# Patient Record
Sex: Female | Born: 1970
Health system: Southern US, Community
[De-identification: ages and names within clinical notes are randomized; demographics above are authoritative.]

## PROBLEM LIST (undated history)

## (undated) DIAGNOSIS — D696 Thrombocytopenia, unspecified: Secondary | ICD-10-CM

## (undated) DIAGNOSIS — K219 Gastro-esophageal reflux disease without esophagitis: Secondary | ICD-10-CM

## (undated) DIAGNOSIS — E559 Vitamin D deficiency, unspecified: Secondary | ICD-10-CM

## (undated) DIAGNOSIS — D0502 Lobular carcinoma in situ of left breast: Secondary | ICD-10-CM

## (undated) DIAGNOSIS — R634 Abnormal weight loss: Secondary | ICD-10-CM

## (undated) DIAGNOSIS — L509 Urticaria, unspecified: Secondary | ICD-10-CM

## (undated) DIAGNOSIS — D509 Iron deficiency anemia, unspecified: Secondary | ICD-10-CM

## (undated) DIAGNOSIS — M3500 Sicca syndrome, unspecified: Secondary | ICD-10-CM

## (undated) DIAGNOSIS — E7800 Pure hypercholesterolemia, unspecified: Secondary | ICD-10-CM

## (undated) DIAGNOSIS — D219 Benign neoplasm of connective and other soft tissue, unspecified: Secondary | ICD-10-CM

## (undated) DIAGNOSIS — E039 Hypothyroidism, unspecified: Secondary | ICD-10-CM

## (undated) DIAGNOSIS — H04129 Dry eye syndrome of unspecified lacrimal gland: Secondary | ICD-10-CM

## (undated) DIAGNOSIS — N979 Female infertility, unspecified: Secondary | ICD-10-CM

## (undated) DIAGNOSIS — E739 Lactose intolerance, unspecified: Secondary | ICD-10-CM

## (undated) DIAGNOSIS — D72819 Decreased white blood cell count, unspecified: Secondary | ICD-10-CM

## (undated) DIAGNOSIS — K819 Cholecystitis, unspecified: Secondary | ICD-10-CM

## (undated) DIAGNOSIS — I499 Cardiac arrhythmia, unspecified: Secondary | ICD-10-CM

## (undated) DIAGNOSIS — K8020 Calculus of gallbladder without cholecystitis without obstruction: Secondary | ICD-10-CM

## (undated) DIAGNOSIS — R768 Other specified abnormal immunological findings in serum: Secondary | ICD-10-CM

## (undated) HISTORY — DX: Thrombocytopenia, unspecified: D69.6

## (undated) HISTORY — DX: Benign neoplasm of connective and other soft tissue, unspecified: D21.9

## (undated) HISTORY — DX: Sjogren syndrome, unspecified: M35.00

## (undated) HISTORY — DX: Dry eye syndrome of unspecified lacrimal gland: H04.129

## (undated) HISTORY — PX: ANTERIOR CRUCIATE LIGAMENT REPAIR: SHX115

## (undated) HISTORY — DX: Lactose intolerance, unspecified: E73.9

## (undated) HISTORY — DX: Urticaria, unspecified: L50.9

## (undated) HISTORY — DX: Decreased white blood cell count, unspecified: D72.819

## (undated) HISTORY — PX: ENDOMETRIAL BIOPSY: SHX622

## (undated) HISTORY — PX: WISDOM TOOTH EXTRACTION: SHX21

## (undated) HISTORY — DX: Cholecystitis, unspecified: K81.9

## (undated) HISTORY — DX: Lobular carcinoma in situ of left breast: D05.02

## (undated) HISTORY — PX: FERTILITY SURGERY: SHX945

## (undated) HISTORY — PX: OTHER SURGICAL HISTORY: SHX169

## (undated) HISTORY — DX: Abnormal weight loss: R63.4

## (undated) HISTORY — DX: Calculus of gallbladder without cholecystitis without obstruction: K80.20

## (undated) HISTORY — DX: Gastro-esophageal reflux disease without esophagitis: K21.9

---

## 2010-11-13 DIAGNOSIS — D696 Thrombocytopenia, unspecified: Secondary | ICD-10-CM | POA: Insufficient documentation

## 2010-11-13 DIAGNOSIS — N979 Female infertility, unspecified: Secondary | ICD-10-CM | POA: Insufficient documentation

## 2012-04-08 DIAGNOSIS — J310 Chronic rhinitis: Secondary | ICD-10-CM | POA: Insufficient documentation

## 2012-04-08 DIAGNOSIS — D8989 Other specified disorders involving the immune mechanism, not elsewhere classified: Secondary | ICD-10-CM | POA: Insufficient documentation

## 2012-04-08 DIAGNOSIS — E039 Hypothyroidism, unspecified: Secondary | ICD-10-CM | POA: Insufficient documentation

## 2012-04-08 DIAGNOSIS — R768 Other specified abnormal immunological findings in serum: Secondary | ICD-10-CM | POA: Insufficient documentation

## 2014-05-06 DIAGNOSIS — L508 Other urticaria: Secondary | ICD-10-CM | POA: Insufficient documentation

## 2014-07-12 ENCOUNTER — Other Ambulatory Visit: Payer: Self-pay

## 2014-07-12 ENCOUNTER — Emergency Department (HOSPITAL_COMMUNITY): Payer: 59

## 2014-07-12 ENCOUNTER — Observation Stay (HOSPITAL_COMMUNITY)
Admission: EM | Admit: 2014-07-12 | Discharge: 2014-07-13 | Disposition: A | Discharge status: Home / Self Care | Payer: 59 | Attending: General Surgery | Admitting: General Surgery

## 2014-07-12 ENCOUNTER — Encounter (HOSPITAL_COMMUNITY): Payer: Self-pay | Admitting: *Deleted

## 2014-07-12 DIAGNOSIS — R1011 Right upper quadrant pain: Secondary | ICD-10-CM | POA: Diagnosis present

## 2014-07-12 DIAGNOSIS — E739 Lactose intolerance, unspecified: Secondary | ICD-10-CM | POA: Insufficient documentation

## 2014-07-12 DIAGNOSIS — E559 Vitamin D deficiency, unspecified: Secondary | ICD-10-CM | POA: Diagnosis not present

## 2014-07-12 DIAGNOSIS — N979 Female infertility, unspecified: Secondary | ICD-10-CM | POA: Insufficient documentation

## 2014-07-12 DIAGNOSIS — E039 Hypothyroidism, unspecified: Secondary | ICD-10-CM | POA: Diagnosis not present

## 2014-07-12 DIAGNOSIS — K8050 Calculus of bile duct without cholangitis or cholecystitis without obstruction: Secondary | ICD-10-CM | POA: Diagnosis present

## 2014-07-12 DIAGNOSIS — K8010 Calculus of gallbladder with chronic cholecystitis without obstruction: Principal | ICD-10-CM | POA: Insufficient documentation

## 2014-07-12 DIAGNOSIS — R52 Pain, unspecified: Secondary | ICD-10-CM

## 2014-07-12 HISTORY — DX: Calculus of gallbladder without cholecystitis without obstruction: K80.20

## 2014-07-12 HISTORY — DX: Vitamin D deficiency, unspecified: E55.9

## 2014-07-12 HISTORY — DX: Female infertility, unspecified: N97.9

## 2014-07-12 HISTORY — DX: Other specified abnormal immunological findings in serum: R76.8

## 2014-07-12 HISTORY — DX: Hypothyroidism, unspecified: E03.9

## 2014-07-12 LAB — URINALYSIS, ROUTINE W REFLEX MICROSCOPIC
Bilirubin Urine: NEGATIVE
Glucose, UA: NEGATIVE mg/dL
Hgb urine dipstick: NEGATIVE
KETONES UR: NEGATIVE mg/dL
Leukocytes, UA: NEGATIVE
Nitrite: NEGATIVE
PH: 8 (ref 5.0–8.0)
Protein, ur: NEGATIVE mg/dL
SPECIFIC GRAVITY, URINE: 1.009 (ref 1.005–1.030)
Urobilinogen, UA: 0.2 mg/dL (ref 0.0–1.0)

## 2014-07-12 LAB — COMPREHENSIVE METABOLIC PANEL
ALT: 22 U/L (ref 14–54)
ANION GAP: 9 (ref 5–15)
AST: 25 U/L (ref 15–41)
Albumin: 4.3 g/dL (ref 3.5–5.0)
Alkaline Phosphatase: 69 U/L (ref 38–126)
BILIRUBIN TOTAL: 0.6 mg/dL (ref 0.3–1.2)
BUN: 10 mg/dL (ref 6–20)
CHLORIDE: 101 mmol/L (ref 101–111)
CO2: 26 mmol/L (ref 22–32)
CREATININE: 0.56 mg/dL (ref 0.44–1.00)
Calcium: 9.6 mg/dL (ref 8.9–10.3)
Glucose, Bld: 124 mg/dL — ABNORMAL HIGH (ref 65–99)
Potassium: 3 mmol/L — ABNORMAL LOW (ref 3.5–5.1)
Sodium: 136 mmol/L (ref 135–145)
Total Protein: 7.8 g/dL (ref 6.5–8.1)

## 2014-07-12 LAB — CBC WITH DIFFERENTIAL/PLATELET
Basophils Absolute: 0.1 10*3/uL (ref 0.0–0.1)
Basophils Relative: 2 % — ABNORMAL HIGH (ref 0–1)
Eosinophils Absolute: 0.1 10*3/uL (ref 0.0–0.7)
Eosinophils Relative: 1 % (ref 0–5)
HEMATOCRIT: 37.9 % (ref 36.0–46.0)
HEMOGLOBIN: 12.8 g/dL (ref 12.0–15.0)
LYMPHS PCT: 29 % (ref 12–46)
Lymphs Abs: 1.8 10*3/uL (ref 0.7–4.0)
MCH: 30.8 pg (ref 26.0–34.0)
MCHC: 33.8 g/dL (ref 30.0–36.0)
MCV: 91.1 fL (ref 78.0–100.0)
MONO ABS: 0.5 10*3/uL (ref 0.1–1.0)
MONOS PCT: 8 % (ref 3–12)
NEUTROS PCT: 60 % (ref 43–77)
Neutro Abs: 3.8 10*3/uL (ref 1.7–7.7)
Platelets: 151 10*3/uL (ref 150–400)
RBC: 4.16 MIL/uL (ref 3.87–5.11)
RDW: 12.7 % (ref 11.5–15.5)
WBC: 6.3 10*3/uL (ref 4.0–10.5)

## 2014-07-12 LAB — LIPASE, BLOOD: LIPASE: 26 U/L (ref 22–51)

## 2014-07-12 LAB — POC URINE PREG, ED: Preg Test, Ur: NEGATIVE

## 2014-07-12 MED ORDER — ACETAMINOPHEN 650 MG RE SUPP: 650.0000 mg | Freq: Four times a day (QID) | RECTAL | Status: DC | PRN

## 2014-07-12 MED ORDER — HYDROCODONE-ACETAMINOPHEN 5-325 MG PO TABS: 1.0000 | ORAL_TABLET | ORAL | Status: DC | PRN

## 2014-07-12 MED ORDER — ONDANSETRON HCL 4 MG/2ML IJ SOLN
4.0000 mg | Freq: Once | INTRAMUSCULAR | Status: AC
  Administered 2014-07-12: 4 mg via INTRAVENOUS
  Filled 2014-07-12: qty 2

## 2014-07-12 MED ORDER — ONDANSETRON HCL 4 MG/2ML IJ SOLN
4.0000 mg | Freq: Four times a day (QID) | INTRAMUSCULAR | Status: DC | PRN
Start: 2014-07-12 — End: 2014-07-13

## 2014-07-12 MED ORDER — HYDROMORPHONE HCL 1 MG/ML IJ SOLN: 0.5000 mg | INTRAMUSCULAR | Status: DC | PRN

## 2014-07-12 MED ORDER — POTASSIUM CHLORIDE IN NACL 20-0.9 MEQ/L-% IV SOLN
INTRAVENOUS | Status: DC
  Administered 2014-07-12 – 2014-07-13 (×2): via INTRAVENOUS
  Filled 2014-07-12 (×3): qty 1000

## 2014-07-12 MED ORDER — SODIUM CHLORIDE 0.9 % IV BOLUS (SEPSIS)
1000.0000 mL | Freq: Once | INTRAVENOUS | Status: AC
  Administered 2014-07-12: 1000 mL via INTRAVENOUS

## 2014-07-12 MED ORDER — DOCUSATE SODIUM 100 MG PO CAPS: 100.0000 mg | ORAL_CAPSULE | Freq: Two times a day (BID) | ORAL | Status: DC | PRN

## 2014-07-12 MED ORDER — DIPHENHYDRAMINE HCL 12.5 MG/5ML PO ELIX: 12.5000 mg | ORAL_SOLUTION | Freq: Four times a day (QID) | ORAL | Status: DC | PRN

## 2014-07-12 MED ORDER — ACETAMINOPHEN 325 MG PO TABS: 650.0000 mg | ORAL_TABLET | Freq: Four times a day (QID) | ORAL | Status: DC | PRN

## 2014-07-12 MED ORDER — POTASSIUM CHLORIDE CRYS ER 20 MEQ PO TBCR
40.0000 meq | EXTENDED_RELEASE_TABLET | Freq: Once | ORAL | Status: AC
  Administered 2014-07-12: 40 meq via ORAL
  Filled 2014-07-12: qty 2

## 2014-07-12 MED ORDER — DIPHENHYDRAMINE HCL 50 MG/ML IJ SOLN: 12.5000 mg | Freq: Four times a day (QID) | INTRAMUSCULAR | Status: DC | PRN

## 2014-07-12 MED ORDER — HYDROMORPHONE HCL 1 MG/ML IJ SOLN
0.5000 mg | Freq: Once | INTRAMUSCULAR | Status: AC
  Administered 2014-07-12: 0.5 mg via INTRAVENOUS
  Filled 2014-07-12: qty 1

## 2014-07-12 MED ORDER — DEXTROSE 5 % IV SOLN
2.0000 g | INTRAVENOUS | Status: AC
  Administered 2014-07-13: 2 g via INTRAVENOUS
  Filled 2014-07-12: qty 2

## 2014-07-12 NOTE — ED Notes (Signed)
"  Pt was here at work and had a gall stone attack."

## 2014-07-12 NOTE — H&P (Signed)
[facility] Admission Note  Sarah [name] 02/04/1970  [id number].    Requesting MD: Dr. 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  The pain is sharp and stabbing abdominal pain which comes and goes.  The pain is worst in the epigastrium, but is mildly diffuse as well.  Associated symptoms include nausea and diaphoresis.  Pain radiates to the mid-back/flanks.  She has previous episode of this type of pain in 2012 where she was worked up for gallstones.  She reportedly had a stone at the ampulla, but her pain resolved and she never sought follow-up.  She has had no subsequent episodes until now.   She is unsure if food was a precipitating factor, but ate eggs this am.  Alleviating factor is usually time.  No CP/SOB, fever/chills, diarrhea/constipation, vomiting, dysuria.  H/o digestion issues as a child (never diagnosed), lactose intolerance and "slow digestion" (post-prandial bloating/fullness).  No h/o abdominal surgeries or hernias.     ROS: All systems reviewed and otherwise negative except for as above  No family history on file.  Past Medical History  Diagnosis Date  . Gall stones   . Infertility, female   . Hypothyroidism     Takes synthroid  . Vitamin D deficiency disease     Takes Vit D  . ANA positive     ?Sjogrens    Past Surgical History  Procedure Laterality Date  . Wisdom tooth extraction    . Endometrial biopsy    . Ivf      Social History:  reports that she has never smoked. She does not have any smokeless tobacco history on file. She reports that she does not drink alcohol or use illicit drugs.  Allergies:  Allergies  Allergen Reactions  . Lactose Intolerance (Gi)      (Not in a hospital admission)  Blood pressure 168/98, pulse 76,  temperature 98 F (36.7 C), temperature source Oral, resp. rate 16, height 5' 3" (1.6 m), weight 54.432 kg (120 lb), SpO2 100 %. Physical Exam: General: pleasant, WD/WN Asian female who is laying in bed in mild distress HEENT: head is normocephalic, atraumatic.  Sclera are noninjected.  PERRL.  Ears and nose without any masses or lesions.  Mouth is pink and moist Heart: regular, rate, and rhythm.  No obvious murmurs, gallops, or rubs noted.  Palpable pedal pulses bilaterally Lungs: CTAB, no wheezes, rhonchi, or rales noted.  Respiratory effort nonlabored Abd: soft, ND, tender in epigastrium, mild pain diffusely, +BS, no masses, hernias, or organomegaly, no scars MS: all 4 extremities are symmetrical with no cyanosis, clubbing, or edema. Skin: warm and dry with no masses, lesions, or rashes Psych: A&Ox3 with an appropriate affect.   Results for orders placed or performed during the hospital encounter of 07/12/14 (from the past 48 hour(s))  CBC with Differential     Status: Abnormal   Collection Time: 07/12/14 11:05 AM  Result Value Ref Range   WBC 6.3 4.0 - 10.5 K/uL   RBC 4.16 3.87 - 5.11 MIL/uL   Hemoglobin 12.8 12.0 - 15.0 g/dL   HCT 37.9 36.0 - 46.0 %   MCV 91.1 78.0 - 100.0 fL   MCH 30.8 26.0 - 34.0 pg   MCHC 33.8 30.0 - 36.0 g/dL   RDW 12.7 11.5 - 15.5 %  Platelets 151 150 - 400 K/uL   Neutrophils Relative % 60 43 - 77 %   Neutro Abs 3.8 1.7 - 7.7 K/uL   Lymphocytes Relative 29 12 - 46 %   Lymphs Abs 1.8 0.7 - 4.0 K/uL   Monocytes Relative 8 3 - 12 %   Monocytes Absolute 0.5 0.1 - 1.0 K/uL   Eosinophils Relative 1 0 - 5 %   Eosinophils Absolute 0.1 0.0 - 0.7 K/uL   Basophils Relative 2 (H) 0 - 1 %   Basophils Absolute 0.1 0.0 - 0.1 K/uL  Comprehensive metabolic panel     Status: Abnormal   Collection Time: 07/12/14 11:05 AM  Result Value Ref Range   Sodium 136 135 - 145 mmol/L   Potassium 3.0 (L) 3.5 - 5.1 mmol/L   Chloride 101 101 - 111 mmol/L   CO2 26 22 - 32 mmol/L    Glucose, Bld 124 (H) 65 - 99 mg/dL   BUN 10 6 - 20 mg/dL   Creatinine, Ser 0.56 0.44 - 1.00 mg/dL   Calcium 9.6 8.9 - 10.3 mg/dL   Total Protein 7.8 6.5 - 8.1 g/dL   Albumin 4.3 3.5 - 5.0 g/dL   AST 25 15 - 41 U/L   ALT 22 14 - 54 U/L   Alkaline Phosphatase 69 38 - 126 U/L   Total Bilirubin 0.6 0.3 - 1.2 mg/dL   GFR calc non Af Amer >60 >60 mL/min   GFR calc Af Amer >60 >60 mL/min    Comment: (NOTE) The eGFR has been calculated using the CKD EPI equation. This calculation has not been validated in all clinical situations. eGFR's persistently <60 mL/min signify possible Chronic Kidney Disease.    Anion gap 9 5 - 15  Lipase, blood     Status: None   Collection Time: 07/12/14 11:05 AM  Result Value Ref Range   Lipase 26 22 - 51 U/L  Urinalysis, Routine w reflex microscopic (not at [facility])     Status: Abnormal   Collection Time: 07/12/14 11:40 AM  Result Value Ref Range   Color, Urine YELLOW YELLOW   APPearance HAZY (A) CLEAR   Specific Gravity, Urine 1.009 1.005 - 1.030   pH 8.0 5.0 - 8.0   Glucose, UA NEGATIVE NEGATIVE mg/dL   Hgb urine dipstick NEGATIVE NEGATIVE   Bilirubin Urine NEGATIVE NEGATIVE   Ketones, ur NEGATIVE NEGATIVE mg/dL   Protein, ur NEGATIVE NEGATIVE mg/dL   Urobilinogen, UA 0.2 0.0 - 1.0 mg/dL   Nitrite NEGATIVE NEGATIVE   Leukocytes, UA NEGATIVE NEGATIVE    Comment: MICROSCOPIC NOT DONE ON URINES WITH NEGATIVE PROTEIN, BLOOD, LEUKOCYTES, NITRITE, OR GLUCOSE <1000 mg/dL.  POC Urine Pregnancy, ED  (If Pre-menopausal female)  not at [facility]     Status: None   Collection Time: 07/12/14 11:57 AM  Result Value Ref Range   Preg Test, Ur NEGATIVE NEGATIVE    Comment:        THE SENSITIVITY OF THIS METHODOLOGY IS >24 mIU/mL    US Abdomen Complete  07/12/2014   CLINICAL DATA:  Cholelithiasis  EXAM: ULTRASOUND ABDOMEN COMPLETE  COMPARISON:  None.  FINDINGS: Gallbladder: Multiple small gallstones layer within the gallbladder. No wall thickening or focal tenderness  per sonographer exam.  Common bile duct: Diameter: 10 mm. Where visualized, no filling defect.  Liver: No focal lesion identified. Within normal limits in parenchymal echogenicity.  IVC: No abnormality visualized.  Pancreas: Visualized portion unremarkable.  Spleen: Size and appearance within normal  limits.  Right Kidney: Length: 10 cm. Echogenicity within normal limits. No mass or hydronephrosis visualized.  Left Kidney: Length: 11 cm. Echogenicity within normal limits. No mass or hydronephrosis visualized.  Abdominal aorta: No aneurysm visualized.  Other findings: None.  IMPRESSION: 1. Cholelithiasis without evidence of acute cholecystitis. 2. Dilated common bile duct (1 cm) without visible choledocholithiasis or other obstructive process.   Electronically Signed   By: Monte Fantasia M.D.   On: 07/12/2014 12:38     Assessment/Plan Biliary colic Mildly dilated CBD at 1cm Cholelithiasis -She is without abnormal LFT's, CBC is normal, vitals are normal.  No concern for acute cholecystitis at this time, but she is quite tender  -We discussed risks/benefits of surgical intervention -We discussed watchful waiting and follow-up as an outpatient -Dr. Donne Hazel has seen and evaluated and she is agreeable to admission and lap chole in the am -Repeat labs in am -She asks about a biliary dilatation, but nor a ERCP would be indicated in this situation with setting of normal LFT's and no evidence of CBD stone.    Nat Christen, [facility] 07/12/2014, 2:33 PM Pager: [phone]

## 2014-07-12 NOTE — ED Provider Notes (Signed)
CSN: [id number]     Arrival date & time 07/12/14  1049 History   First MD Initiated Contact with Patient 07/12/14 1102     Chief Complaint  Patient presents with  . Cholelithiasis     The history is provided by the patient. No language interpreter was used.   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 The pain is primarily in the epigastrium but it is diffuse. Pain is nonradiating. It is constant in nature but improving. She reports associated diaphoresis and nausea. No fevers, shortness of breath, chest pain, vomiting. She had a history of similar episode previously and had a gallstone stuck in her ampulla. The gallstone process without intervention and her pain resolved. She had an ultrasound that demonstrated sludge in the gallbladder. Symptoms are severe, constant, improving.  Past Medical History  Diagnosis Date  . Gall stones    History reviewed. No pertinent past surgical history. No family history on file. History  Substance Use Topics  . Smoking status: Never Smoker   . Smokeless tobacco: Not on file  . Alcohol Use: No   OB History    No data available     Review of Systems  All other systems reviewed and are negative.     Allergies  Lactose intolerance (gi)  Home Medications   Prior to Admission medications   Medication Sig Start Date End Date Taking? Authorizing Provider  levothyroxine (SYNTHROID, LEVOTHROID) 75 MCG tablet TAKE 1 TABLET BY MOUTH DAILY ON AN EMPTY STOMACH WITH A GLASS OF WATER 30-60 MINS BEFORE BREAKFAST 04/12/14  Yes Historical Provider, MD  Vitamin D, Ergocalciferol, (DRISDOL) 50000 UNITS CAPS capsule TAKE 1 CAPSULE (50,000 UNITS TOTAL) BY MOUTH EVERY 14 (FOURTEEN) DAYS. 06/17/14  Yes Historical Provider, MD   BP 112/63 mmHg  Pulse 56  Temp(Src) 98 F (36.7 C) (Oral)  Resp 16  Ht 5\' 3"  (1.6 m)   Wt 120 lb (54.432 kg)  BMI 21.26 kg/m2  SpO2 100% Physical Exam  Constitutional: She is oriented to person, place, and time. She appears well-developed and well-nourished. She appears distressed.  HENT:  Head: Normocephalic and atraumatic.  Cardiovascular: Normal rate and regular rhythm.   No murmur heard. Pulmonary/Chest: Effort normal and breath sounds normal. No respiratory distress.  Abdominal: Soft.  Mild epigastric tenderness without guarding or rebound tenderness.  Musculoskeletal: She exhibits no edema or tenderness.  Neurological: She is alert and oriented to person, place, and time.  Skin: Skin is warm. She is diaphoretic.  Psychiatric: She has a normal mood and affect. Her behavior is normal.  Nursing note and vitals reviewed.   ED Course  Procedures (including critical care time) Labs Review Labs Reviewed  CBC WITH DIFFERENTIAL/PLATELET - Abnormal; Notable for the following:    Basophils Relative 2 (*)    All other components within normal limits  COMPREHENSIVE METABOLIC PANEL - Abnormal; Notable for the following:    Potassium 3.0 (*)    Glucose, Bld 124 (*)    All other components within normal limits  URINALYSIS, ROUTINE W REFLEX MICROSCOPIC (NOT AT [facility]) - Abnormal; Notable for the following:    APPearance HAZY (*)    All other components within normal limits  LIPASE, BLOOD  POC URINE PREG, ED    Imaging Review US Abdomen Complete  07/12/2014   CLINICAL DATA:  Cholelithiasis  EXAM: ULTRASOUND  ABDOMEN COMPLETE  COMPARISON:  None.  FINDINGS: Gallbladder: Multiple small gallstones layer within the gallbladder. No wall thickening or focal tenderness per sonographer exam.  Common bile duct: Diameter: 10 mm. Where visualized, no filling defect.  Liver: No focal lesion identified. Within normal limits in parenchymal echogenicity.  IVC: No abnormality visualized.  Pancreas: Visualized portion unremarkable.  Spleen: Size and appearance within normal limits.  Right  Kidney: Length: 10 cm. Echogenicity within normal limits. No mass or hydronephrosis visualized.  Left Kidney: Length: 11 cm. Echogenicity within normal limits. No mass or hydronephrosis visualized.  Abdominal aorta: No aneurysm visualized.  Other findings: None.  IMPRESSION: 1. Cholelithiasis without evidence of acute cholecystitis. 2. Dilated common bile duct (1 cm) without visible choledocholithiasis or other obstructive process.   Electronically Signed   By: Monte Fantasia M.D.   On: 07/12/2014 12:38     EKG Interpretation None      MDM   Final diagnoses:  Biliary colic    Patient with history of cholelithiasis here for abdominal pain consistent with biliary colic. There is no evidence of acute cholecystitis on imaging and exam. There is biliary ductal dilation without evidence of obstruction. Discussed with general surgery, plan to admit for cholecystectomy.  Quintella Reichert, MD 07/12/14 1606

## 2014-07-12 NOTE — ED Notes (Signed)
Pt left for US

## 2014-07-13 ENCOUNTER — Encounter (HOSPITAL_COMMUNITY)
Admission: EM | Disposition: A | Discharge status: Home / Self Care | Payer: Self-pay | Source: Home / Self Care | Attending: Emergency Medicine

## 2014-07-13 ENCOUNTER — Observation Stay (HOSPITAL_COMMUNITY): Payer: 59 | Admitting: Certified Registered Nurse Anesthetist

## 2014-07-13 HISTORY — PX: CHOLECYSTECTOMY: SHX55

## 2014-07-13 HISTORY — PX: LAPAROSCOPIC CHOLECYSTECTOMY: SUR755

## 2014-07-13 LAB — COMPREHENSIVE METABOLIC PANEL
ALT: 56 U/L — AB (ref 14–54)
AST: 43 U/L — ABNORMAL HIGH (ref 15–41)
Albumin: 3.5 g/dL (ref 3.5–5.0)
Alkaline Phosphatase: 72 U/L (ref 38–126)
Anion gap: 6 (ref 5–15)
BUN: 5 mg/dL — ABNORMAL LOW (ref 6–20)
CALCIUM: 8.6 mg/dL — AB (ref 8.9–10.3)
CHLORIDE: 105 mmol/L (ref 101–111)
CO2: 27 mmol/L (ref 22–32)
Creatinine, Ser: 0.55 mg/dL (ref 0.44–1.00)
GFR calc Af Amer: >60 mL/min (ref >60)
Glucose, Bld: 95 mg/dL (ref 65–99)
POTASSIUM: 4 mmol/L (ref 3.5–5.1)
SODIUM: 138 mmol/L (ref 135–145)
Total Bilirubin: 0.9 mg/dL (ref 0.3–1.2)
Total Protein: 6.9 g/dL (ref 6.5–8.1)

## 2014-07-13 LAB — SURGICAL PCR SCREEN
MRSA, PCR: NEGATIVE
STAPHYLOCOCCUS AUREUS: POSITIVE — AB

## 2014-07-13 LAB — CBC
HCT: 34.1 % — ABNORMAL LOW (ref 36.0–46.0)
Hemoglobin: 11.5 g/dL — ABNORMAL LOW (ref 12.0–15.0)
MCH: 31 pg (ref 26.0–34.0)
MCHC: 33.7 g/dL (ref 30.0–36.0)
MCV: 91.9 fL (ref 78.0–100.0)
PLATELETS: 127 10*3/uL — AB (ref 150–400)
RBC: 3.71 MIL/uL — ABNORMAL LOW (ref 3.87–5.11)
RDW: 12.8 % (ref 11.5–15.5)
WBC: 5.7 10*3/uL (ref 4.0–10.5)

## 2014-07-13 LAB — LIPASE, BLOOD: LIPASE: 17 U/L — AB (ref 22–51)

## 2014-07-13 SURGERY — LAPAROSCOPIC CHOLECYSTECTOMY WITH INTRAOPERATIVE CHOLANGIOGRAM
Anesthesia: General

## 2014-07-13 MED ORDER — GLYCOPYRROLATE 0.2 MG/ML IJ SOLN
INTRAMUSCULAR | Status: AC
  Filled 2014-07-13: qty 3

## 2014-07-13 MED ORDER — OXYCODONE HCL 5 MG PO TABS: 5.0000 mg | ORAL_TABLET | Freq: Once | ORAL | Status: DC | PRN

## 2014-07-13 MED ORDER — LABETALOL HCL 5 MG/ML IV SOLN
INTRAVENOUS | Status: DC | PRN
  Administered 2014-07-13 (×2): 5 mg via INTRAVENOUS

## 2014-07-13 MED ORDER — CEFTRIAXONE SODIUM IN DEXTROSE 40 MG/ML IV SOLN
2.0000 g | INTRAVENOUS | Status: DC
  Filled 2014-07-13: qty 50

## 2014-07-13 MED ORDER — FENTANYL CITRATE (PF) 100 MCG/2ML IJ SOLN
INTRAMUSCULAR | Status: DC | PRN
  Administered 2014-07-13: 50 ug via INTRAVENOUS
  Administered 2014-07-13 (×2): 100 ug via INTRAVENOUS

## 2014-07-13 MED ORDER — MIDAZOLAM HCL 2 MG/2ML IJ SOLN
INTRAMUSCULAR | Status: AC
  Filled 2014-07-13: qty 2

## 2014-07-13 MED ORDER — MUPIROCIN 2 % EX OINT
1.0000 "application " | TOPICAL_OINTMENT | Freq: Two times a day (BID) | CUTANEOUS | Status: DC
  Administered 2014-07-13: 1 via NASAL
  Filled 2014-07-13: qty 22

## 2014-07-13 MED ORDER — SODIUM CHLORIDE 0.9 % IR SOLN
Status: DC | PRN
  Administered 2014-07-13: 1000 mL

## 2014-07-13 MED ORDER — CHLORHEXIDINE GLUCONATE CLOTH 2 % EX PADS: 6.0000 | MEDICATED_PAD | Freq: Every day | CUTANEOUS | Status: DC

## 2014-07-13 MED ORDER — ROCURONIUM BROMIDE 50 MG/5ML IV SOLN
INTRAVENOUS | Status: AC
  Filled 2014-07-13: qty 1

## 2014-07-13 MED ORDER — LABETALOL HCL 5 MG/ML IV SOLN
INTRAVENOUS | Status: AC
  Filled 2014-07-13: qty 4

## 2014-07-13 MED ORDER — OXYCODONE HCL 5 MG/5ML PO SOLN: 5.0000 mg | Freq: Once | ORAL | Status: DC | PRN

## 2014-07-13 MED ORDER — BUPIVACAINE-EPINEPHRINE (PF) 0.25% -1:200000 IJ SOLN
INTRAMUSCULAR | Status: AC
  Filled 2014-07-13: qty 30

## 2014-07-13 MED ORDER — OXYCODONE-ACETAMINOPHEN 10-325 MG PO TABS: 1.0000 | ORAL_TABLET | Freq: Four times a day (QID) | ORAL | Status: AC | PRN

## 2014-07-13 MED ORDER — LIDOCAINE HCL (CARDIAC) 20 MG/ML IV SOLN
INTRAVENOUS | Status: DC | PRN
  Administered 2014-07-13: 60 mg via INTRAVENOUS

## 2014-07-13 MED ORDER — 0.9 % SODIUM CHLORIDE (POUR BTL) OPTIME
TOPICAL | Status: DC | PRN
  Administered 2014-07-13: 1000 mL

## 2014-07-13 MED ORDER — NEOSTIGMINE METHYLSULFATE 10 MG/10ML IV SOLN
INTRAVENOUS | Status: DC | PRN
  Administered 2014-07-13: 3 mg via INTRAVENOUS

## 2014-07-13 MED ORDER — ONDANSETRON HCL 4 MG/2ML IJ SOLN
INTRAMUSCULAR | Status: DC | PRN
  Administered 2014-07-13: 4 mg via INTRAVENOUS

## 2014-07-13 MED ORDER — SUCCINYLCHOLINE CHLORIDE 20 MG/ML IJ SOLN
INTRAMUSCULAR | Status: DC | PRN
  Administered 2014-07-13: 100 mg via INTRAVENOUS

## 2014-07-13 MED ORDER — POTASSIUM CHLORIDE IN NACL 20-0.9 MEQ/L-% IV SOLN
INTRAVENOUS | Status: DC
  Administered 2014-07-13: 13:00:00 via INTRAVENOUS
  Filled 2014-07-13: qty 1000

## 2014-07-13 MED ORDER — PROPOFOL 10 MG/ML IV BOLUS
INTRAVENOUS | Status: AC
  Filled 2014-07-13: qty 20

## 2014-07-13 MED ORDER — ONDANSETRON HCL 4 MG/2ML IJ SOLN
INTRAMUSCULAR | Status: AC
  Filled 2014-07-13: qty 2

## 2014-07-13 MED ORDER — DEXAMETHASONE SODIUM PHOSPHATE 4 MG/ML IJ SOLN
INTRAMUSCULAR | Status: AC
  Filled 2014-07-13: qty 1

## 2014-07-13 MED ORDER — MIDAZOLAM HCL 5 MG/5ML IJ SOLN
INTRAMUSCULAR | Status: DC | PRN
  Administered 2014-07-13: 2 mg via INTRAVENOUS

## 2014-07-13 MED ORDER — HYDROMORPHONE HCL 1 MG/ML IJ SOLN
INTRAMUSCULAR | Status: AC
  Filled 2014-07-13: qty 1

## 2014-07-13 MED ORDER — SCOPOLAMINE 1 MG/3DAYS TD PT72
MEDICATED_PATCH | TRANSDERMAL | Status: DC | PRN
  Administered 2014-07-13: 1 via TRANSDERMAL

## 2014-07-13 MED ORDER — LIDOCAINE HCL (CARDIAC) 20 MG/ML IV SOLN
INTRAVENOUS | Status: AC
  Filled 2014-07-13: qty 5

## 2014-07-13 MED ORDER — HYDROMORPHONE HCL 1 MG/ML IJ SOLN
0.2500 mg | INTRAMUSCULAR | Status: DC | PRN
  Administered 2014-07-13 (×2): 0.5 mg via INTRAVENOUS

## 2014-07-13 MED ORDER — FENTANYL CITRATE (PF) 250 MCG/5ML IJ SOLN
INTRAMUSCULAR | Status: AC
  Filled 2014-07-13: qty 5

## 2014-07-13 MED ORDER — PROPOFOL 10 MG/ML IV BOLUS
INTRAVENOUS | Status: DC | PRN
  Administered 2014-07-13: 200 mg via INTRAVENOUS

## 2014-07-13 MED ORDER — LACTATED RINGERS IV SOLN
INTRAVENOUS | Status: DC | PRN
  Administered 2014-07-13: 08:00:00 via INTRAVENOUS

## 2014-07-13 MED ORDER — BUPIVACAINE-EPINEPHRINE 0.25% -1:200000 IJ SOLN
INTRAMUSCULAR | Status: DC | PRN
  Administered 2014-07-13: 10 mL

## 2014-07-13 MED ORDER — GLYCOPYRROLATE 0.2 MG/ML IJ SOLN
INTRAMUSCULAR | Status: DC | PRN
  Administered 2014-07-13: 0.4 mg via INTRAVENOUS

## 2014-07-13 MED ORDER — ARTIFICIAL TEARS OP OINT
TOPICAL_OINTMENT | OPHTHALMIC | Status: AC
  Filled 2014-07-13: qty 3.5

## 2014-07-13 MED ORDER — EPHEDRINE SULFATE 50 MG/ML IJ SOLN
INTRAMUSCULAR | Status: DC | PRN
  Administered 2014-07-13: 5 mg via INTRAVENOUS

## 2014-07-13 MED ORDER — NEOSTIGMINE METHYLSULFATE 10 MG/10ML IV SOLN
INTRAVENOUS | Status: AC
  Filled 2014-07-13: qty 1

## 2014-07-13 MED ORDER — IOHEXOL 300 MG/ML  SOLN: INTRAMUSCULAR | Status: DC | PRN

## 2014-07-13 MED ORDER — ROCURONIUM BROMIDE 100 MG/10ML IV SOLN
INTRAVENOUS | Status: DC | PRN
  Administered 2014-07-13: 25 mg via INTRAVENOUS

## 2014-07-13 MED ORDER — MEPERIDINE HCL 25 MG/ML IJ SOLN: 6.2500 mg | INTRAMUSCULAR | Status: DC | PRN

## 2014-07-13 MED ORDER — SCOPOLAMINE 1 MG/3DAYS TD PT72
MEDICATED_PATCH | TRANSDERMAL | Status: AC
  Filled 2014-07-13: qty 1

## 2014-07-13 MED ORDER — LEVOTHYROXINE SODIUM 50 MCG PO TABS: 75.0000 ug | ORAL_TABLET | Freq: Every day | ORAL | Status: DC

## 2014-07-13 MED ORDER — DEXAMETHASONE SODIUM PHOSPHATE 4 MG/ML IJ SOLN
INTRAMUSCULAR | Status: DC | PRN
  Administered 2014-07-13: 4 mg via INTRAVENOUS

## 2014-07-13 SURGICAL SUPPLY — 40 items
APPLIER CLIP 5 13 M/L LIGAMAX5 (MISCELLANEOUS) ×2
BLADE SURG ROTATE 9660 (MISCELLANEOUS) IMPLANT
CANISTER SUCTION 2500CC (MISCELLANEOUS) ×2 IMPLANT
CHLORAPREP W/TINT 26ML (MISCELLANEOUS) ×2 IMPLANT
CLIP APPLIE 5 13 M/L LIGAMAX5 (MISCELLANEOUS) ×1 IMPLANT
CLSR STERI-STRIP ANTIMIC 1/2X4 (GAUZE/BANDAGES/DRESSINGS) ×2 IMPLANT
COVER MAYO STAND STRL (DRAPES) ×2 IMPLANT
COVER SURGICAL LIGHT HANDLE (MISCELLANEOUS) ×2 IMPLANT
DERMABOND ADVANCED (GAUZE/BANDAGES/DRESSINGS) ×1
DERMABOND ADVANCED .7 DNX12 (GAUZE/BANDAGES/DRESSINGS) ×1 IMPLANT
DEVICE TROCAR PUNCTURE CLOSURE (ENDOMECHANICALS) ×4 IMPLANT
DRAPE C-ARM 42X72 X-RAY (DRAPES) ×2 IMPLANT
ELECT REM PT RETURN 9FT ADLT (ELECTROSURGICAL) ×2
ELECTRODE REM PT RTRN 9FT ADLT (ELECTROSURGICAL) ×1 IMPLANT
GLOVE BIO SURGEON STRL SZ7 (GLOVE) ×6 IMPLANT
GLOVE BIOGEL PI IND STRL 7.5 (GLOVE) ×3 IMPLANT
GLOVE BIOGEL PI INDICATOR 7.5 (GLOVE) ×3
GOWN STRL REUS W/ TWL LRG LVL3 (GOWN DISPOSABLE) ×3 IMPLANT
GOWN STRL REUS W/TWL LRG LVL3 (GOWN DISPOSABLE) ×3
KIT BASIN OR (CUSTOM PROCEDURE TRAY) ×2 IMPLANT
KIT ROOM TURNOVER OR (KITS) ×2 IMPLANT
LIQUID BAND (GAUZE/BANDAGES/DRESSINGS) ×2 IMPLANT
NS IRRIG 1000ML POUR BTL (IV SOLUTION) ×2 IMPLANT
PAD ARMBOARD 7.5X6 YLW CONV (MISCELLANEOUS) ×2 IMPLANT
POUCH RETRIEVAL ECOSAC 10 (ENDOMECHANICALS) ×1 IMPLANT
POUCH RETRIEVAL ECOSAC 10MM (ENDOMECHANICALS) ×1
SCISSORS LAP 5X35 DISP (ENDOMECHANICALS) ×2 IMPLANT
SET CHOLANGIOGRAPH 5 50 .035 (SET/KITS/TRAYS/PACK) ×2 IMPLANT
SET IRRIG TUBING LAPAROSCOPIC (IRRIGATION / IRRIGATOR) ×2 IMPLANT
SLEEVE ENDOPATH XCEL 5M (ENDOMECHANICALS) ×4 IMPLANT
SPECIMEN JAR SMALL (MISCELLANEOUS) ×2 IMPLANT
STRIP CLOSURE SKIN 1/2X4 (GAUZE/BANDAGES/DRESSINGS) IMPLANT
SUT MNCRL AB 4-0 PS2 18 (SUTURE) ×2 IMPLANT
SUT VICRYL 0 UR6 27IN ABS (SUTURE) ×2 IMPLANT
TOWEL OR 17X24 6PK STRL BLUE (TOWEL DISPOSABLE) ×2 IMPLANT
TOWEL OR 17X26 10 PK STRL BLUE (TOWEL DISPOSABLE) ×2 IMPLANT
TRAY LAPAROSCOPIC (CUSTOM PROCEDURE TRAY) ×2 IMPLANT
TROCAR XCEL BLUNT TIP 100MML (ENDOMECHANICALS) ×2 IMPLANT
TROCAR XCEL NON-BLD 5MMX100MML (ENDOMECHANICALS) ×2 IMPLANT
TUBING INSUFFLATION (TUBING) ×2 IMPLANT

## 2014-07-13 NOTE — Transfer of Care (Signed)
Immediate Anesthesia Transfer of Care Note  Patient: Sarah [name]  Procedure(s) Performed: Procedure(s): LAPAROSCOPIC CHOLECYSTECTOMY WITH INTRAOPERATIVE CHOLANGIOGRAM (N/A)  Patient Location: PACU  Anesthesia Type:General  Level of Consciousness: sedated and patient cooperative  Airway & Oxygen Therapy: Patient Spontanous Breathing and Patient connected to nasal cannula oxygen  Post-op Assessment: Report given to RN and Post -op Vital signs reviewed and stable  Post vital signs: Reviewed and stable  Last Vitals:  Filed Vitals:   07/13/14 0542  BP: 130/78  Pulse: 63  Temp: 37 C  Resp: 16    Complications: No apparent anesthesia complications

## 2014-07-13 NOTE — Discharge Instructions (Signed)
[facility] LAPAROSCOPIC SURGERY: POST OP INSTRUCTIONS  Always review your discharge instruction sheet given to you by the facility where your surgery was performed. IF YOU HAVE DISABILITY OR FAMILY LEAVE FORMS, YOU MUST BRING THEM TO THE OFFICE FOR PROCESSING.   DO NOT GIVE THEM TO YOUR DOCTOR.  1. A prescription for pain medication may be given to you upon discharge.  Take your pain medication as prescribed, if needed.  If narcotic pain medicine is not needed, then you may take acetaminophen (Tylenol), naprosyn (Alleve), or ibuprofen (Advil) as needed. 2. Take your usually prescribed medications unless otherwise directed. 3. If you need a refill on your pain medication, please contact your pharmacy.  They will contact our office to request authorization. Prescriptions will not be filled after 5pm or on week-ends. 4. You should follow a light diet the first few days after arrival home, such as soup and crackers, etc.  Be sure to include lots of fluids daily. 5. Most patients will experience some swelling and bruising in the area of the incisions.  Ice packs will help.  Swelling and bruising can take several days to resolve.  6. It is common to experience some constipation if taking pain medication after surgery.  Increasing fluid intake and taking a stool softener (such as Colace) will usually help or prevent this problem from occurring.  A mild laxative (Milk of Magnesia or Miralax) should be taken according to package instructions if there are no bowel movements after 48 hours. 7. Unless discharge instructions indicate otherwise, you may remove your bandages 48 hours after surgery, and you may shower at that time.  You may have steri-strips (small skin tapes) in place directly over the incision.  These strips should be left on the skin for 7-10 days.  If your surgeon used skin glue on the incision, you may shower in 24 hours.  The glue will flake  off over the next 2-3 weeks.  Any sutures or staples will be removed at the office during your follow-up visit. 8. ACTIVITIES:  You may resume regular (light) daily activities beginning the next day--such as daily self-care, walking, climbing stairs--gradually increasing activities as tolerated.  You may have sexual intercourse when it is comfortable.  Refrain from any heavy lifting or straining until approved by your doctor. a. You may drive when you are no longer taking prescription pain medication, you can comfortably wear a seatbelt, and you can safely maneuver your car and apply brakes. b. RETURN TO WORK:  __________________________________________________________ 9. You should see your doctor in the office for a follow-up appointment approximately 2-3 weeks after your surgery.  Make sure that you call for this appointment within a day or two after you arrive home to insure a convenient appointment time. 10. OTHER INSTRUCTIONS: __________________________________________________________________________________________________________________________ __________________________________________________________________________________________________________________________ WHEN TO CALL YOUR DOCTOR: 1. Fever over 101.0 2. Inability to urinate 3. Continued bleeding from incision. 4. Increased pain, redness, or drainage from the incision. 5. Increasing abdominal pain  The clinic staff is available to answer your questions during regular business hours.  Please don't hesitate to call and ask to speak to one of the nurses for clinical concerns.  If you have a medical emergency, go to the nearest emergency room or call 911.  A surgeon from [facility] is always on call at the hospital. [address], [address], [place], [place]  [address] ? [address] [address], [place], [place]   [address] [phone] ? [phone] ? [phone]   [phone] Web site: [email or web address]  

## 2014-07-13 NOTE — Anesthesia Procedure Notes (Signed)
Procedure Name: Intubation Date/Time: 07/13/2014 7:45 AM Performed by: YACOUB, ALISHA B Pre-anesthesia Checklist: Patient identified, Emergency Drugs available, Suction available, Patient being monitored and Timeout performed Patient Re-evaluated:Patient Re-evaluated prior to inductionOxygen Delivery Method: Circle system utilized Preoxygenation: Pre-oxygenation with 100% oxygen Intubation Type: IV induction Ventilation: Mask ventilation without difficulty Laryngoscope Size: Mac and 3 Grade View: Grade I Tube type: Oral Tube size: 6.5 mm Number of attempts: 1 Airway Equipment and Method: Stylet and LTA kit utilized Placement Confirmation: ETT inserted through vocal cords under direct vision,  positive ETCO2 and breath sounds checked- equal and bilateral Secured at: 20 cm Tube secured with: Tape Dental Injury: Teeth and Oropharynx as per pre-operative assessment      

## 2014-07-13 NOTE — Anesthesia Postprocedure Evaluation (Signed)
  Anesthesia Post-op Note  Patient: Sarah [name]  Procedure(s) Performed: Procedure(s): LAPAROSCOPIC CHOLECYSTECTOMY WITH INTRAOPERATIVE CHOLANGIOGRAM (N/A)  Patient Location: PACU  Anesthesia Type: General   Level of Consciousness: awake, alert  and oriented  Airway and Oxygen Therapy: Patient Spontanous Breathing  Post-op Pain: mild  Post-op Assessment: Post-op Vital signs reviewed  Post-op Vital Signs: Reviewed  Last Vitals:  Filed Vitals:   07/13/14 0917  BP: 129/75  Pulse: 59  Temp:   Resp: 14    Complications: No apparent anesthesia complications

## 2014-07-13 NOTE — Op Note (Signed)
Preoperative diagnosis: symptomatic cholelithiasis Postoperative diagnosis: same as above Procedure: laparoscopic cholecystectomy Surgeon: Dr Serita Grammes Anesthesia: general EBL: minimal Drains none Specimen gb and contents to pathology Complications: none Sponge count correct at completion Disposition to recovery stable  Indications: This is a 78 yof who presents with ruq pain, cholelithiasis and history of the same. We discussed proceeding with her laparoscopic cholecystectomy.  Procedure: After informed consent was obtained the patient was taken to the operating room. She was given antibiotics. Sequential compression devices were on her legs. She was placed under general anesthesia without complication. Her abdomen was prepped and draped in the standard sterile surgical fashion. A surgical timeout was then performed.  I infiltrated marcaine below the umbilicus. I made an incision and then entered the fascia sharply. I then entered the peritoneum bluntly. I placed a 0 vicryl pursestring suture and inserted a hasson trocar. I then inserted 3 further 5 mm trocars in the epigastrium and ruq. I then was able to retract the gallbladder cephalad and lateral. the cbd was dilated consistent with ultrasound. The cystic duct was short and there was a lot of inflammation present. There was some bleeding on the posterior branch of the cystic artery due to inflammation.  Eventually  I was able to identify the cystic duct and clearly had the critical view of safety.I had intended to perform a cholangiogram but the duct was diminutive and short. I elected due to concerns over control to not do this.  Her tb and alk phos were normal although transaminases were mildly elevated.  I then clipped the cystic duct and divided it. The duct was viable and the clips traversed the duct. I then treated the artery in similar fashion as it was immediately adjacent to the duct. I then removed the gallbladder from  the liver bed and placed it in a bag. It was then removed from the umbilical incision. I then obtained hemostasis and irrigated.  I then removed the umbilical trocar and closed with 0 vicryl and the endoclose device after tying down the pursestring.  I then desufflated the abdomen and removed all my remaining trocars. I then close these with 4-0 Monocryl and Dermabond. She tolerated this well was extubated and transferred to the recovery room in stable condition.

## 2014-07-13 NOTE — Anesthesia Preprocedure Evaluation (Signed)
Anesthesia Evaluation  Patient identified by MRN, date of birth, ID band Patient awake    Reviewed: Allergy & Precautions, NPO status , Patient's Chart, lab work & pertinent test results  Airway Mallampati: I  TM Distance: >3 FB Neck ROM: Full    Dental  (+) Teeth Intact, Dental Advisory Given   Pulmonary  breath sounds clear to auscultation        Cardiovascular Rhythm:Regular Rate:Normal     Neuro/Psych    GI/Hepatic   Endo/Other    Renal/GU      Musculoskeletal   Abdominal   Peds  Hematology   Anesthesia Other Findings   Reproductive/Obstetrics                             Anesthesia Physical Anesthesia Plan  ASA: II  Anesthesia Plan: General   Post-op Pain Management:    Induction: Intravenous  Airway Management Planned: Oral ETT  Additional Equipment:   Intra-op Plan:   Post-operative Plan: Extubation in OR  Informed Consent: I have reviewed the patients History and Physical, chart, labs and discussed the procedure including the risks, benefits and alternatives for the proposed anesthesia with the patient or authorized representative who has indicated his/her understanding and acceptance.   Dental advisory given  Plan Discussed with: CRNA, Anesthesiologist and Surgeon  Anesthesia Plan Comments:         Anesthesia Quick Evaluation

## 2014-07-13 NOTE — Progress Notes (Signed)
Discharge instructions reviewed with patient and husband. Rx given. Pt and husband report they are ready for discharge and will be staying in town tonight. No questions at this time.

## 2014-07-14 ENCOUNTER — Encounter (HOSPITAL_COMMUNITY): Payer: Self-pay | Admitting: Emergency Medicine

## 2014-07-14 ENCOUNTER — Observation Stay (HOSPITAL_COMMUNITY): Payer: 59

## 2014-07-14 ENCOUNTER — Observation Stay (HOSPITAL_COMMUNITY)
Admission: EM | Admit: 2014-07-14 | Discharge: 2014-07-15 | Disposition: A | Discharge status: Home / Self Care | Payer: 59 | Attending: General Surgery | Admitting: General Surgery

## 2014-07-14 DIAGNOSIS — E876 Hypokalemia: Secondary | ICD-10-CM | POA: Diagnosis not present

## 2014-07-14 DIAGNOSIS — Z9049 Acquired absence of other specified parts of digestive tract: Secondary | ICD-10-CM | POA: Diagnosis not present

## 2014-07-14 DIAGNOSIS — K838 Other specified diseases of biliary tract: Secondary | ICD-10-CM | POA: Diagnosis not present

## 2014-07-14 DIAGNOSIS — K8050 Calculus of bile duct without cholangitis or cholecystitis without obstruction: Secondary | ICD-10-CM

## 2014-07-14 DIAGNOSIS — R1013 Epigastric pain: Secondary | ICD-10-CM | POA: Diagnosis present

## 2014-07-14 LAB — COMPREHENSIVE METABOLIC PANEL
ALT: 83 U/L — ABNORMAL HIGH (ref 14–54)
ANION GAP: 10 (ref 5–15)
AST: 91 U/L — AB (ref 15–41)
Albumin: 4.6 g/dL (ref 3.5–5.0)
Alkaline Phosphatase: 103 U/L (ref 38–126)
BILIRUBIN TOTAL: 1.6 mg/dL — AB (ref 0.3–1.2)
BUN: 8 mg/dL (ref 6–20)
CHLORIDE: 99 mmol/L — AB (ref 101–111)
CO2: 29 mmol/L (ref 22–32)
CREATININE: 0.61 mg/dL (ref 0.44–1.00)
Calcium: 9.6 mg/dL (ref 8.9–10.3)
GFR calc Af Amer: >60 mL/min (ref >60)
GFR calc non Af Amer: >60 mL/min (ref >60)
GLUCOSE: 103 mg/dL — AB (ref 65–99)
Potassium: 3.3 mmol/L — ABNORMAL LOW (ref 3.5–5.1)
Sodium: 138 mmol/L (ref 135–145)
Total Protein: 8.7 g/dL — ABNORMAL HIGH (ref 6.5–8.1)

## 2014-07-14 LAB — CBC WITH DIFFERENTIAL/PLATELET
BASOS ABS: 0 10*3/uL (ref 0.0–0.1)
BASOS PCT: 0 % (ref 0–1)
EOS ABS: 0 10*3/uL (ref 0.0–0.7)
Eosinophils Relative: 0 % (ref 0–5)
HEMATOCRIT: 39.8 % (ref 36.0–46.0)
HEMOGLOBIN: 13.4 g/dL (ref 12.0–15.0)
Lymphocytes Relative: 13 % (ref 12–46)
Lymphs Abs: 1.2 10*3/uL (ref 0.7–4.0)
MCH: 30.9 pg (ref 26.0–34.0)
MCHC: 33.7 g/dL (ref 30.0–36.0)
MCV: 91.9 fL (ref 78.0–100.0)
Monocytes Absolute: 0.6 10*3/uL (ref 0.1–1.0)
Monocytes Relative: 7 % (ref 3–12)
NEUTROS PCT: 80 % — AB (ref 43–77)
Neutro Abs: 6.8 10*3/uL (ref 1.7–7.7)
Platelets: 138 10*3/uL — ABNORMAL LOW (ref 150–400)
RBC: 4.33 MIL/uL (ref 3.87–5.11)
RDW: 12.8 % (ref 11.5–15.5)
WBC: 8.6 10*3/uL (ref 4.0–10.5)

## 2014-07-14 LAB — URINALYSIS, ROUTINE W REFLEX MICROSCOPIC
Bilirubin Urine: NEGATIVE
GLUCOSE, UA: NEGATIVE mg/dL
Hgb urine dipstick: NEGATIVE
Ketones, ur: 15 mg/dL — AB
LEUKOCYTES UA: NEGATIVE
Nitrite: NEGATIVE
PH: 6.5 (ref 5.0–8.0)
Protein, ur: NEGATIVE mg/dL
SPECIFIC GRAVITY, URINE: 1.006 (ref 1.005–1.030)
Urobilinogen, UA: 0.2 mg/dL (ref 0.0–1.0)

## 2014-07-14 LAB — LIPASE, BLOOD: LIPASE: 20 U/L — AB (ref 22–51)

## 2014-07-14 LAB — POC URINE PREG, ED: Preg Test, Ur: NEGATIVE

## 2014-07-14 MED ORDER — HYDROCODONE-ACETAMINOPHEN 5-325 MG PO TABS: 1.0000 | ORAL_TABLET | ORAL | Status: DC | PRN

## 2014-07-14 MED ORDER — ONDANSETRON HCL 4 MG/2ML IJ SOLN
4.0000 mg | Freq: Four times a day (QID) | INTRAMUSCULAR | Status: DC | PRN
  Administered 2014-07-14: 4 mg via INTRAVENOUS
  Filled 2014-07-14: qty 2

## 2014-07-14 MED ORDER — ACETAMINOPHEN 650 MG RE SUPP: 650.0000 mg | Freq: Four times a day (QID) | RECTAL | Status: DC | PRN

## 2014-07-14 MED ORDER — POTASSIUM CHLORIDE IN NACL 20-0.9 MEQ/L-% IV SOLN
INTRAVENOUS | Status: DC
  Administered 2014-07-14 – 2014-07-15 (×2): via INTRAVENOUS
  Filled 2014-07-14 (×2): qty 1000

## 2014-07-14 MED ORDER — LEVOTHYROXINE SODIUM 75 MCG PO TABS
75.0000 ug | ORAL_TABLET | Freq: Every day | ORAL | Status: DC
  Administered 2014-07-15: 75 ug via ORAL
  Filled 2014-07-14: qty 3
  Filled 2014-07-14: qty 1
  Filled 2014-07-14: qty 3
  Filled 2014-07-14: qty 1

## 2014-07-14 MED ORDER — DOCUSATE SODIUM 100 MG PO CAPS: 100.0000 mg | ORAL_CAPSULE | Freq: Two times a day (BID) | ORAL | Status: DC | PRN

## 2014-07-14 MED ORDER — POLYETHYLENE GLYCOL 3350 17 G PO PACK: 17.0000 g | PACK | Freq: Every day | ORAL | Status: DC | PRN

## 2014-07-14 MED ORDER — DIPHENHYDRAMINE HCL 50 MG/ML IJ SOLN: 12.5000 mg | Freq: Four times a day (QID) | INTRAMUSCULAR | Status: DC | PRN

## 2014-07-14 MED ORDER — DIPHENHYDRAMINE HCL 12.5 MG/5ML PO ELIX: 12.5000 mg | ORAL_SOLUTION | Freq: Four times a day (QID) | ORAL | Status: DC | PRN

## 2014-07-14 MED ORDER — ACETAMINOPHEN 325 MG PO TABS: 650.0000 mg | ORAL_TABLET | Freq: Four times a day (QID) | ORAL | Status: DC | PRN

## 2014-07-14 MED ORDER — HYDROMORPHONE HCL 1 MG/ML IJ SOLN
0.5000 mg | INTRAMUSCULAR | Status: DC | PRN
  Administered 2014-07-14: 0.5 mg via INTRAVENOUS
  Filled 2014-07-14: qty 1

## 2014-07-14 MED ORDER — GADOBENATE DIMEGLUMINE 529 MG/ML IV SOLN
10.0000 mL | Freq: Once | INTRAVENOUS | Status: AC | PRN
  Administered 2014-07-14: 10 mL via INTRAVENOUS

## 2014-07-14 MED ORDER — WHITE PETROLATUM GEL
Status: DC
Start: 2014-07-14 — End: 2014-07-15
  Filled 2014-07-14: qty 1

## 2014-07-14 NOTE — H&P (Signed)
[facility] Progress Note     Subjective: 44 y/o Asian Female presented to [facility] on POD #1 s/p lap chole done yesterday.  She was discharged home yesterday afternoon.  She was doing well this morning.  Pain was well controlled.  She was able to drink water and eat rice soup without problems.  Then all of a sudden this afternoon she developed the same severe colicky abdominal pain in her RUQ and epigastrium like she had on Monday before the surgery.  Mild nausea without vomiting.  No fever/chills, but admits to sweating.  She thinks she has a retained stone.  She was advised to come to [facility] for evaluation.  No CP/SOB.  No BM since surgery, but having flatus.  Objective: Vital signs in last 24 hours: Temp:  [97.4 F (36.3 C)] 97.4 F (36.3 C) (06/15 1430) Pulse Rate:  [63-71] 63 (06/15 1522) Resp:  [16] 16 (06/15 1522) BP: (120)/(76-81) 120/81 mmHg (06/15 1522) SpO2:  [100 %] 100 % (06/15 1522)    Intake/Output from previous day:   Intake/Output this shift:    PE: General: pleasant, WD/WN Asian female who is in mild distress due to abdominal pain HEENT: head is normocephalic, atraumatic.  Sclera are noninjected.  PERRL.  Ears and nose without any masses or lesions.  Mouth is pink and moist Heart: regular, rate, and rhythm.  Normal s1,s2. No obvious murmurs, gallops, or rubs noted.  Palpable radial and pedal pulses bilaterally Lungs: CTAB, no wheezes, rhonchi, or rales noted.  Respiratory effort nonlabored Abd: soft, ND, tender in epigastrium and RUQ, +BS, no masses, hernias, or organomegaly, incisions C/D/I with dermabond and steri-strips in place MS: all 4 extremities are symmetrical with no cyanosis, clubbing, or edema. Skin: warm and dry with no masses, lesions, or rashes Psych: A&Ox3 with an appropriate affect.   Lab Results:   Recent Labs  07/13/14 0450 07/14/14 1440  WBC 5.7 8.6  HGB 11.5* 13.4  HCT 34.1* 39.8  PLT 127* 138*   BMET  Recent Labs  07/13/14 0450 07/14/14 1440  NA 138 138  K 4.0 3.3*  CL 105 99*  CO2 27 29  GLUCOSE 95 103*  BUN 5* 8  CREATININE 0.55 0.61  CALCIUM 8.6* 9.6   PT/INR No results for input(s): LABPROT, INR in the last 72 hours. CMP     Component Value Date/Time   NA 138 07/14/2014 1440   K 3.3* 07/14/2014 1440   CL 99* 07/14/2014 1440   CO2 29 07/14/2014 1440   GLUCOSE 103* 07/14/2014 1440   BUN 8 07/14/2014 1440   CREATININE 0.61 07/14/2014 1440   CALCIUM 9.6 07/14/2014 1440   PROT 8.7* 07/14/2014 1440   ALBUMIN 4.6 07/14/2014 1440   AST 91* 07/14/2014 1440   ALT 83* 07/14/2014 1440   ALKPHOS 103 07/14/2014 1440   BILITOT 1.6* 07/14/2014 1440   GFRNONAA >60 07/14/2014 1440   GFRAA >60 07/14/2014 1440   Lipase     Component Value Date/Time   LIPASE 20* 07/14/2014 1440       Studies/Results: Ct Outside Films Body  07/12/2014   This examination belongs to an outside facility and is stored  here for comparison purposes only.  Contact the originating outside  institution for any associated report or interpretation.   Anti-infectives: Anti-infectives    None       Assessment/Plan Acute cholecystitis POD #1 s/p lap chole WITHOUT IOC Post-operative pain Mild transaminitis 80-90's Hyperbilirubinemia -Admit to hospital  -Ordered MRCP  to check for retained CBD stones -IVF, pain control, antiemetics  -May need to call GI for ERCP if MRCP is positive  Hypokalemia -Added K+ in IVF     Nat Christen 07/14/2014, 3:33 PM Pager: [phone]

## 2014-07-14 NOTE — ED Notes (Signed)
Coralie Keens, PA for [place] surgery at bedside

## 2014-07-14 NOTE — ED Notes (Signed)
Pt reports just discharged yesterday from lap choly. States went home and ate something. Began having increased pain. Pts doctor is Donne Hazel.

## 2014-07-14 NOTE — Progress Notes (Deleted)
[facility] Progress Note     Subjective: 44 y/o Asian Female presented to [facility] on POD #1 s/p lap chole done yesterday.  She was discharged home yesterday afternoon.  She was doing well this morning.  Pain was well controlled.  She was able to drink water and eat rice soup without problems.  Then all of a sudden this afternoon she developed the same severe colicky abdominal pain in her RUQ and epigastrium like she had on Monday before the surgery.  Mild nausea without vomiting.  No fever/chills, but admits to sweating.  She thinks she has a retained stone.  She was advised to come to [facility] for evaluation.  No CP/SOB.  No BM since surgery, but having flatus.  Objective: Vital signs in last 24 hours: Temp:  [97.4 F (36.3 C)] 97.4 F (36.3 C) (06/15 1430) Pulse Rate:  [63-71] 63 (06/15 1522) Resp:  [16] 16 (06/15 1522) BP: (120)/(76-81) 120/81 mmHg (06/15 1522) SpO2:  [100 %] 100 % (06/15 1522)    Intake/Output from previous day:   Intake/Output this shift:    PE: General: pleasant, WD/WN Asian female who is in mild distress due to abdominal pain HEENT: head is normocephalic, atraumatic.  Sclera are noninjected.  PERRL.  Ears and nose without any masses or lesions.  Mouth is pink and moist Heart: regular, rate, and rhythm.  Normal s1,s2. No obvious murmurs, gallops, or rubs noted.  Palpable radial and pedal pulses bilaterally Lungs: CTAB, no wheezes, rhonchi, or rales noted.  Respiratory effort nonlabored Abd: soft, ND, tender in epigastrium and RUQ, +BS, no masses, hernias, or organomegaly, incisions C/D/I with dermabond and steri-strips in place MS: all 4 extremities are symmetrical with no cyanosis, clubbing, or edema. Skin: warm and dry with no masses, lesions, or rashes Psych: A&Ox3 with an appropriate affect.   Lab Results:   Recent Labs  07/13/14 0450 07/14/14 1440  WBC 5.7 8.6  HGB 11.5* 13.4  HCT 34.1* 39.8  PLT 127* 138*   BMET  Recent Labs  07/13/14 0450 07/14/14 1440  NA 138 138  K 4.0 3.3*  CL 105 99*  CO2 27 29  GLUCOSE 95 103*  BUN 5* 8  CREATININE 0.55 0.61  CALCIUM 8.6* 9.6   PT/INR No results for input(s): LABPROT, INR in the last 72 hours. CMP     Component Value Date/Time   NA 138 07/14/2014 1440   K 3.3* 07/14/2014 1440   CL 99* 07/14/2014 1440   CO2 29 07/14/2014 1440   GLUCOSE 103* 07/14/2014 1440   BUN 8 07/14/2014 1440   CREATININE 0.61 07/14/2014 1440   CALCIUM 9.6 07/14/2014 1440   PROT 8.7* 07/14/2014 1440   ALBUMIN 4.6 07/14/2014 1440   AST 91* 07/14/2014 1440   ALT 83* 07/14/2014 1440   ALKPHOS 103 07/14/2014 1440   BILITOT 1.6* 07/14/2014 1440   GFRNONAA >60 07/14/2014 1440   GFRAA >60 07/14/2014 1440   Lipase     Component Value Date/Time   LIPASE 20* 07/14/2014 1440       Studies/Results: Ct Outside Films Body  07/12/2014   This examination belongs to an outside facility and is stored  here for comparison purposes only.  Contact the originating outside  institution for any associated report or interpretation.   Anti-infectives: Anti-infectives    None       Assessment/Plan Acute cholecystitis POD #1 s/p lap chole WITHOUT IOC Post-operative pain Mild transaminitis 80-90's Hyperbilirubinemia -Admit to hospital  -Ordered MRCP  to check for retained CBD stones -IVF, pain control, antiemetics  -May need to call GI for ERCP if MRCP is positive  Hypokalemia -Added K+ in IVF     Nat Christen 07/14/2014, 3:33 PM Pager: [phone]

## 2014-07-15 DIAGNOSIS — K838 Other specified diseases of biliary tract: Secondary | ICD-10-CM | POA: Diagnosis not present

## 2014-07-15 LAB — CBC
HEMATOCRIT: 34.4 % — AB (ref 36.0–46.0)
Hemoglobin: 11.3 g/dL — ABNORMAL LOW (ref 12.0–15.0)
MCH: 29.9 pg (ref 26.0–34.0)
MCHC: 32.8 g/dL (ref 30.0–36.0)
MCV: 91 fL (ref 78.0–100.0)
Platelets: 115 10*3/uL — ABNORMAL LOW (ref 150–400)
RBC: 3.78 MIL/uL — ABNORMAL LOW (ref 3.87–5.11)
RDW: 12.9 % (ref 11.5–15.5)
WBC: 5.8 10*3/uL (ref 4.0–10.5)

## 2014-07-15 LAB — COMPREHENSIVE METABOLIC PANEL
ALBUMIN: 3.4 g/dL — AB (ref 3.5–5.0)
ALK PHOS: 129 U/L — AB (ref 38–126)
ALT: 221 U/L — AB (ref 14–54)
ANION GAP: 9 (ref 5–15)
AST: 174 U/L — AB (ref 15–41)
BILIRUBIN TOTAL: 1.2 mg/dL (ref 0.3–1.2)
BUN: 7 mg/dL (ref 6–20)
CHLORIDE: 103 mmol/L (ref 101–111)
CO2: 25 mmol/L (ref 22–32)
Calcium: 8.5 mg/dL — ABNORMAL LOW (ref 8.9–10.3)
Creatinine, Ser: 0.56 mg/dL (ref 0.44–1.00)
GFR calc Af Amer: >60 mL/min (ref >60)
GFR calc non Af Amer: >60 mL/min (ref >60)
Glucose, Bld: 82 mg/dL (ref 65–99)
POTASSIUM: 3.7 mmol/L (ref 3.5–5.1)
Sodium: 137 mmol/L (ref 135–145)
TOTAL PROTEIN: 6.9 g/dL (ref 6.5–8.1)

## 2014-07-15 NOTE — Discharge Instructions (Signed)
[facility] LAPAROSCOPIC SURGERY: POST OP INSTRUCTIONS  Always review your discharge instruction sheet given to you by the facility where your surgery was performed. IF YOU HAVE DISABILITY OR FAMILY LEAVE FORMS, YOU MUST BRING THEM TO THE OFFICE FOR PROCESSING.   DO NOT GIVE THEM TO YOUR DOCTOR.  1. A prescription for pain medication may be given to you upon discharge.  Take your pain medication as prescribed, if needed.  If narcotic pain medicine is not needed, then you may take acetaminophen (Tylenol), naprosyn (Alleve), or ibuprofen (Advil) as needed. 2. Take your usually prescribed medications unless otherwise directed. 3. If you need a refill on your pain medication, please contact your pharmacy.  They will contact our office to request authorization. Prescriptions will not be filled after 5pm or on week-ends. 4. You should follow a light diet the first few days after arrival home, such as soup and crackers, etc.  Be sure to include lots of fluids daily. 5. Most patients will experience some swelling and bruising in the area of the incisions.  Ice packs will help.  Swelling and bruising can take several days to resolve.  6. It is common to experience some constipation if taking pain medication after surgery.  Increasing fluid intake and taking a stool softener (such as Colace) will usually help or prevent this problem from occurring.  A mild laxative (Milk of Magnesia or Miralax) should be taken according to package instructions if there are no bowel movements after 48 hours. 7. Unless discharge instructions indicate otherwise, you may remove your bandages 48 hours after surgery, and you may shower at that time.  You may have steri-strips (small skin tapes) in place directly over the incision.  These strips should be left on the skin for 7-10 days.  If your surgeon used skin glue on the incision, you may shower in 24 hours.  The glue will flake  off over the next 2-3 weeks.  Any sutures or staples will be removed at the office during your follow-up visit. 8. ACTIVITIES:  You may resume regular (light) daily activities beginning the next day--such as daily self-care, walking, climbing stairs--gradually increasing activities as tolerated.  You may have sexual intercourse when it is comfortable.  Refrain from any heavy lifting or straining until approved by your doctor. a. You may drive when you are no longer taking prescription pain medication, you can comfortably wear a seatbelt, and you can safely maneuver your car and apply brakes. b. RETURN TO WORK:  __________________________________________________________ 9. You should see your doctor in the office for a follow-up appointment approximately 2-3 weeks after your surgery.  Make sure that you call for this appointment within a day or two after you arrive home to insure a convenient appointment time. 10. OTHER INSTRUCTIONS: __________________________________________________________________________________________________________________________ __________________________________________________________________________________________________________________________ WHEN TO CALL YOUR DOCTOR: 1. Fever over 101.0 2. Inability to urinate 3. Continued bleeding from incision. 4. Increased pain, redness, or drainage from the incision. 5. Increasing abdominal pain  The clinic staff is available to answer your questions during regular business hours.  Please don't hesitate to call and ask to speak to one of the nurses for clinical concerns.  If you have a medical emergency, go to the nearest emergency room or call 911.  A surgeon from [facility] is always on call at the hospital. [address], [address], [place], [place]  [address] ? [address] [address], [place], [place]   [address] [phone] ? [phone] ? [phone] Web site: [email or web address]  

## 2014-07-15 NOTE — Progress Notes (Signed)
Patient discharged to home with instructions. 

## 2014-07-15 NOTE — Discharge Summary (Signed)
Physician Discharge Summary  Patient ID: Sarah [name] MRN: [id number] DOB/AGE: [birth year] 44 y.o.  Admit date: 07/14/2014 Discharge date: 07/15/2014  Admission Diagnoses: S/p lap chole with elevated bilirubin  Discharge Diagnoses:  Active Problems:   S/P laparoscopic cholecystectomy   Hyperbilirubinemia   Discharged Condition: good  Hospital Course: 84 yof healthy female admitted pod 1 from lap chole (unable to do cholangiogram due to short duct). She had recurrent epigastric pain and elevated bilirubin.  Her pain stopped overnight and underwent mrcp that did not show any stones. She does have dilated duct which I think consistent with passing multiple stones.  Her bilirubin has returned to normal today. Will discharge home with follow up and repeat lfts in a couple weeks.   Consults: None  Significant Diagnostic Studies: mrcp  Treatments: IV hydration  Discharge Exam: Blood pressure 107/66, pulse 89, temperature 98.8 F (37.1 C), temperature source Oral, resp. rate 16, height 5\' 3"  (1.6 m), weight 54.4 kg (119 lb 14.9 oz), last menstrual period 06/27/2014, SpO2 99 %. GI: soft nontender incisions clean  Disposition: 01-Home or Self Care     Medication List    TAKE these medications        levothyroxine 75 MCG tablet  Commonly known as:  SYNTHROID, LEVOTHROID  TAKE 1 TABLET BY MOUTH DAILY ON AN EMPTY STOMACH WITH A GLASS OF WATER 30-60 MINS BEFORE BREAKFAST     oxyCODONE-acetaminophen 10-325 MG per tablet  Commonly known as:  PERCOCET  Take 1 tablet by mouth every 6 (six) hours as needed for pain.     Vitamin D (Ergocalciferol) 50000 UNITS Caps capsule  Commonly known as:  DRISDOL  TAKE 1 CAPSULE (50,000 UNITS TOTAL) BY MOUTH EVERY 14 (FOURTEEN) DAYS.           Follow-up Information    Follow up with Rolm Bookbinder, MD.   Specialty:  General Surgery   Why:  as scheduled   Contact information:   [address] [place]  [address] [phone]        Signed: Rolm Bookbinder 07/15/2014, 8:38 AM

## 2014-07-22 NOTE — Discharge Summary (Signed)
Physician Discharge Summary  Patient ID: Sarah [name] MRN: [id number] DOB/AGE: Sep 10, 1970 44 y.o.  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 She was admitted and underwent laparoscopic cholecystectomy without event.  The following day she was tolerating her diet, pain controlled and was discharged home.   Consults: None  Significant Diagnostic Studies: none  Treatments: surgery: lap chole   Disposition: 01-Home or Self Care     Medication List    TAKE these medications        levothyroxine 75 MCG tablet  Commonly known as:  SYNTHROID, LEVOTHROID  TAKE 1 TABLET BY MOUTH DAILY ON AN EMPTY STOMACH WITH A GLASS OF WATER 30-60 MINS BEFORE BREAKFAST     oxyCODONE-acetaminophen 10-325 MG per tablet  Commonly known as:  PERCOCET  Take 1 tablet by mouth every 6 (six) hours as needed for pain.     Vitamin D (Ergocalciferol) 50000 UNITS Caps capsule  Commonly known as:  DRISDOL  TAKE 1 CAPSULE (50,000 UNITS TOTAL) BY MOUTH EVERY 14 (FOURTEEN) DAYS.           Follow-up Information    Follow up with [facility], MD. Call in 2 weeks.   Specialty:  General Surgery   Contact information:   [address] [place] [address] [phone]       Signed: Rolm Bookbinder 07/22/2014, 7:22 AM

## 2015-04-22 MED FILL — DESONIDE 0.05% CREAM: 0.05 | 5 days supply | Qty: 15 | Fill #0

## 2015-11-18 MED FILL — CLOBETASOL 0.05% OINTMENT: 0.05 | 10 days supply | Qty: 15 | Fill #0 | Status: TO

## 2016-09-05 MED FILL — EPINEPHRINE 0.3 MG AUTO-INJ: 0.3 | 30 days supply | Qty: 2 | Fill #0

## 2016-09-05 MED FILL — CLOBETASOL 0.05% OINTMENT: 0.05 | 10 days supply | Qty: 15 | Fill #0

## 2016-09-06 MED FILL — predniSONE 5 MG TABS: 5 | 9 days supply | Qty: 44 | Fill #0

## 2016-09-12 ENCOUNTER — Encounter: Payer: Self-pay | Admitting: Allergy and Immunology

## 2016-09-12 ENCOUNTER — Ambulatory Visit (INDEPENDENT_AMBULATORY_CARE_PROVIDER_SITE_OTHER): Payer: 59 | Admitting: Allergy and Immunology

## 2016-09-12 VITALS — BP 130/80 | HR 72 | Temp 97.8°F | Resp 18 | Ht 62.8 in | Wt 111.2 lb

## 2016-09-12 DIAGNOSIS — M3500 Sicca syndrome, unspecified: Secondary | ICD-10-CM

## 2016-09-12 DIAGNOSIS — E559 Vitamin D deficiency, unspecified: Secondary | ICD-10-CM | POA: Diagnosis not present

## 2016-09-12 DIAGNOSIS — R768 Other specified abnormal immunological findings in serum: Secondary | ICD-10-CM

## 2016-09-12 DIAGNOSIS — R945 Abnormal results of liver function studies: Secondary | ICD-10-CM

## 2016-09-12 DIAGNOSIS — L500 Allergic urticaria: Secondary | ICD-10-CM | POA: Diagnosis not present

## 2016-09-12 DIAGNOSIS — R7989 Other specified abnormal findings of blood chemistry: Secondary | ICD-10-CM | POA: Diagnosis not present

## 2016-09-12 NOTE — Patient Instructions (Addendum)
  1. Finish prednisone taper  2. Blood - CMP, CBC w/diff, TSH, T4, TP, alpha gal, Tryptase, ANA w/reflex, vitamin D  3. EpiPen, Benadryl, M.D./ER evaluation for allergic reaction if needed  4. Can use cetirizine 10 mg twice a day and famotidine twice a day plus OTC benadryl if needed (Be careful about antihistamine use in the setting of dry eye syndrome)  5. Can use OTC Systane multiple times a day.  6. If still with elevated liver function tests will need LKM antibody, antimitochondrial antibody, anti-smooth muscle/actin antibody, viral hepatitis titers.  7. Further evaluation? Yes, if urticaria recurrent

## 2016-09-12 NOTE — Progress Notes (Signed)
Dear Dr. Mariea Clonts,  Thank you for referring Cortlyn Cannell to the [facility] on 09/12/2016.   Below is a summation of this patient's evaluation and recommendations.  Thank you for your referral. I will keep you informed about this patient's response to treatment.   If you have any questions please do not hesitate to contact me.   Sincerely,  Jiles Prows, MD Allergy / Immunology [place] [facility]   ______________________________________________________________________    NEW PATIENT NOTE  Referring Provider: Alvia Grove,* Primary Provider: Alvia Grove, MD Date of office visit: 09/12/2016    Subjective:   Chief Complaint:  Sarah [name] (DOB: 1970-11-09) is a 46 y.o. female who presents to the clinic on 09/12/2016 with a chief complaint of Urticaria .     HPI: Matasha presents to this clinic in evaluation of urticaria. Over the course of the past week she has developed diffuse urticaria with global involvement and the sensation that she might be a little bit dizzy and she almost passed out. She was seen by her primary care physician and given prednisone at high dose and used antihistamines and over the course of 3-4 days she has improved and over the course of the past 24 hours has not had any reactivity at all. She has no other associated systemic or constitutional symptoms and there is no obvious provoking factor giving rise to this issue. There has been not been any new environmental change ongoing within her life recently. She did have a history from the age of approximately 38-73 years of age of recurrent urticaria associated with intermittent respiratory and GI symptoms with the development of abdominal cramping. But she went approximately 15 years without any issues with her skin or associated systemic symptoms.    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 She also has a history of positive ANA with a positive SSA and does have an issue with dry eyes and dry mouth. She also has a history of developing a dermatitis in December 2015 after visiting [place] and eating seafood that lasted approximately 6 months and was described as small red spots on her skin that were nonpruritic in nature and appeared to resolve around the point in time in which she had her cholecystectomy. She also has a history of infertility and apparently had investigation for this issue.  Past Medical History:  Diagnosis Date  . ANA positive    ?Sjogrens  . Gall stones   . Hypothyroidism    Takes synthroid  . Infertility, female   . Lactose intolerance   . Urticaria   . Vitamin D deficiency disease    Takes Vit D    Past Surgical History:  Procedure Laterality Date  . ANTERIOR CRUCIATE LIGAMENT REPAIR Left ~ 2005  . CHOLECYSTECTOMY N/A 07/13/2014   Procedure: LAPAROSCOPIC CHOLECYSTECTOMY WITH INTRAOPERATIVE CHOLANGIOGRAM;  Surgeon: Rolm Bookbinder, MD;  Location: [facility];  Service: General;  Laterality: N/A;  . ENDOMETRIAL BIOPSY    . FERTILITY SURGERY  ~ 2008; ~ 2011; ~ 2012  . LAPAROSCOPIC CHOLECYSTECTOMY  07/13/2014  . WISDOM TOOTH EXTRACTION      Allergies as of 09/12/2016      Reactions   Lactose Intolerance (gi)    Tape Rash      Medication List      BENADRYL PO Take by mouth  as needed.   cetirizine 10 MG tablet Commonly known as:  ZYRTEC Take 10 mg by mouth daily.   clobetasol ointment 0.05 % Commonly known as:  TEMOVATE APPLY TO AFFECTED AREA S  TWICE DAILY AS NEEDED   EPINEPHrine 0.3 mg/0.3 mL Soaj injection Commonly known as:  EPI-PEN See admin instructions.   famotidine 10 MG tablet Commonly known as:  PEPCID Take 10 mg by mouth 2 (two) times daily.   predniSONE 5 MG tablet Commonly known as:  DELTASONE TAPER AS DIRECTED STARTING TODAY 12 TABLETS BY MOUTH TODAY, THEN  10,8,6,4,2,1 DAILY   THEN 1 2 FOR 2 DAYS       Review of systems negative except as noted in HPI / PMHx or noted below:  Review of Systems  Constitutional: Negative.   HENT: Negative.   Eyes: Negative.   Respiratory: Negative.   Cardiovascular: Negative.   Gastrointestinal: Negative.   Genitourinary: Negative.   Musculoskeletal: Negative.   Skin: Negative.   Neurological: Negative.   Endo/Heme/Allergies: Negative.   Psychiatric/Behavioral: Negative.     Family History  Problem Relation Age of Onset  . Lupus Sister     Social History   Social History  . Marital status: Married    Spouse name: N/A  . Number of children: N/A  . Years of education: N/A   Occupational History  . internal medicine physician [employer]   Social History Main Topics  . Smoking status: Never Smoker  . Smokeless tobacco: Never Used  . Alcohol use No  . Drug use: No  . Sexual activity: Yes   Other Topics Concern  . 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  Objective:   Vitals:   09/12/16 0852  BP: 130/80  Pulse: 72  Resp: 18  Temp: 97.8 F (36.6 C)   Height: 5' 2.8" (159.5 cm) Weight: 111 lb 3.2 oz (50.4 kg)  Physical Exam  Constitutional: She is well-developed, well-nourished, and in no distress.  HENT:  Head: Normocephalic. Head is without right periorbital erythema and without left periorbital erythema.  Right Ear: Tympanic membrane, external ear and ear canal normal.  Left Ear: Tympanic membrane, external ear and ear canal normal.  Nose: Nose normal. No mucosal edema or rhinorrhea.  Mouth/Throat: Oropharynx is clear and moist and mucous membranes are normal. No oropharyngeal exudate.  Eyes: Pupils are equal, round, and  reactive to light. Conjunctivae and lids are normal.  Neck: Trachea normal. No tracheal deviation present. No thyromegaly present.  Cardiovascular: Normal rate, regular rhythm, S1 normal, S2 normal and normal heart sounds.   No murmur heard. Pulmonary/Chest: Effort normal. No stridor. No tachypnea. No respiratory distress. She has no wheezes. She has no rales. She exhibits no tenderness.  Abdominal: Soft. She exhibits no distension and no mass. There is no hepatosplenomegaly. There is no tenderness. There is no rebound and no guarding.  Musculoskeletal: She exhibits no edema or tenderness.  Lymphadenopathy:       Head (right side): No tonsillar adenopathy present.       Head (left side): No tonsillar adenopathy present.    She has no cervical adenopathy.    She has no axillary adenopathy.  Neurological: She is alert. Gait normal.  Skin: No rash noted. She is not diaphoretic. No erythema. No pallor. Nails show no clubbing.  Psychiatric: Mood and affect normal.    Diagnostics:    Results of blood tests obtained 07/15/2014 identifies a AST 174 U/L, ALT 221 U/L, white blood cell count 5.8, hemoglobin 11.3, platelet 115.  Results of blood tests obtained 04/20/2013 identified a positive ANA with a titer of 1:2560 and positive anti-RO.  Results of blood tests obtained 10/24/2010 identified a negative hepatitis C viral antibody and a negative hepatitis B surface antigen.  Assessment and Plan:    1. Allergic urticaria   2. 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 Positive ANA (antinuclear antibody)   4. Elevated liver function tests   5. Vitamin D deficiency     1. Finish prednisone taper  2. Blood - CMP, CBC w/diff, TSH, T4, TP, alpha gal, Tryptase, ANA w/reflex, vitamin D   3. EpiPen, Benadryl, M.D./ER evaluation for allergic reaction if needed  4. Can use cetirizine 10 mg twice a day and famotidine twice a day plus OTC benadryl if needed (Be careful about antihistamine use in the setting of dry  eye syndrome)  5. Can use OTC Systane multiple times a day.  6. If still with elevated liver function tests will need LKM antibody, antimitochondrial antibody, anti-smooth muscle/actin antibody, viral hepatitis titers.  7. Further evaluation? Yes, if urticaria recurrent  It is not entirely clear why Salina had an episode of immunological hyperreactivity recently but this does appear to be burning out and other than performing some screening blood tests as noted above and continuing to use an H1 and H2 receptor blocker for a few more days we are not going to have her undergo any further evaluation and treatment for this issue other than to perform some screening blood tests as noted above. In addition, she has a few other issues that need attention including her positive ANA with associated sicca syndrome and her history of elevated liver function tests believed secondary to inflamed gallbladder. It is quite possible that she has a more active systemic disease with autoimmune hepatitis that may require further evaluation if her liver function tests are still elevated. She will keep in contact with me noting her response to this approach and I will contact her with the results of her blood tests once they are available for review.  Jiles Prows, MD Allergy / Immunology [facility]

## 2016-09-15 LAB — CBC WITH DIFFERENTIAL/PLATELET
Basophils Absolute: 0 10*3/uL (ref 0.0–0.2)
Basos: 0 %
EOS (ABSOLUTE): 0.1 10*3/uL (ref 0.0–0.4)
Eos: 2 %
Hematocrit: 39.1 % (ref 34.0–46.6)
Hemoglobin: 12.8 g/dL (ref 11.1–15.9)
IMMATURE GRANULOCYTES: 0 %
Immature Grans (Abs): 0 10*3/uL (ref 0.0–0.1)
Lymphocytes Absolute: 1.7 10*3/uL (ref 0.7–3.1)
Lymphs: 34 %
MCH: 30.5 pg (ref 26.6–33.0)
MCHC: 32.7 g/dL (ref 31.5–35.7)
MCV: 93 fL (ref 79–97)
MONOS ABS: 0.3 10*3/uL (ref 0.1–0.9)
Monocytes: 6 %
NEUTROS PCT: 58 %
Neutrophils Absolute: 3 10*3/uL (ref 1.4–7.0)
Platelets: 94 10*3/uL — CL (ref 150–379)
RBC: 4.19 x10E6/uL (ref 3.77–5.28)
RDW: 13.7 % (ref 12.3–15.4)
WBC: 5.1 10*3/uL (ref 3.4–10.8)

## 2016-09-15 LAB — COMPREHENSIVE METABOLIC PANEL
A/G RATIO: 1.4 (ref 1.2–2.2)
ALT: 13 IU/L (ref 0–32)
AST: 14 IU/L (ref 0–40)
Albumin: 4.6 g/dL (ref 3.5–5.5)
Alkaline Phosphatase: 60 IU/L (ref 39–117)
BUN/Creatinine Ratio: 19 (ref 9–23)
BUN: 14 mg/dL (ref 6–24)
Bilirubin Total: 0.5 mg/dL (ref 0.0–1.2)
CHLORIDE: 98 mmol/L (ref 96–106)
CO2: 24 mmol/L (ref 20–29)
CREATININE: 0.73 mg/dL (ref 0.57–1.00)
Calcium: 9.6 mg/dL (ref 8.7–10.2)
GFR calc Af Amer: 114 mL/min/{1.73_m2} (ref >59)
GFR calc non Af Amer: 99 mL/min/{1.73_m2} (ref >59)
Globulin, Total: 3.2 g/dL (ref 1.5–4.5)
Glucose: 88 mg/dL (ref 65–99)
POTASSIUM: 3.9 mmol/L (ref 3.5–5.2)
SODIUM: 138 mmol/L (ref 134–144)
Total Protein: 7.8 g/dL (ref 6.0–8.5)

## 2016-09-15 LAB — LIPID PANEL
CHOLESTEROL TOTAL: 217 mg/dL — AB (ref 100–199)
Chol/HDL Ratio: 3.2 ratio (ref 0.0–4.4)
HDL: 67 mg/dL (ref >39)
LDL Calculated: 125 mg/dL — ABNORMAL HIGH (ref 0–99)
TRIGLYCERIDES: 125 mg/dL (ref 0–149)
VLDL Cholesterol Cal: 25 mg/dL (ref 5–40)

## 2016-09-15 LAB — THYROID PEROXIDASE ANTIBODY: THYROID PEROXIDASE ANTIBODY: 141 [IU]/mL — AB (ref 0–34)

## 2016-09-15 LAB — VITAMIN D 25 HYDROXY (VIT D DEFICIENCY, FRACTURES): Vit D, 25-Hydroxy: 23.4 ng/mL — ABNORMAL LOW (ref 30.0–100.0)

## 2016-09-15 LAB — ENA+DNA/DS+SJORGEN'S
ENA RNP AB: 3 AI — AB (ref 0.0–0.9)
dsDNA Ab: 1 IU/mL (ref 0–9)

## 2016-09-15 LAB — TRYPTASE: Tryptase: 2 ug/L — ABNORMAL LOW (ref 2.2–13.2)

## 2016-09-15 LAB — ALPHA-GAL PANEL
Beef (Bos spp) IgE: <0.1 kU/L (ref <0.35)
Class Interpretation: 0
Class Interpretation: 0
Class Interpretation: 0
Pork (Sus spp) IgE: <0.1 kU/L (ref <0.35)

## 2016-09-15 LAB — T4, FREE: FREE T4: 1.41 ng/dL (ref 0.82–1.77)

## 2016-09-15 LAB — TSH: TSH: 4.64 u[IU]/mL — ABNORMAL HIGH (ref 0.450–4.500)

## 2016-09-15 LAB — ANA W/REFLEX: Anti Nuclear Antibody(ANA): POSITIVE — AB

## 2016-10-16 ENCOUNTER — Ambulatory Visit: Payer: Self-pay | Admitting: Allergy and Immunology

## 2017-06-21 ENCOUNTER — Encounter: Payer: Self-pay | Admitting: Family Medicine

## 2017-06-21 ENCOUNTER — Other Ambulatory Visit (HOSPITAL_COMMUNITY)
Admission: RE | Admit: 2017-06-21 | Discharge: 2017-06-21 | Disposition: A | Discharge status: Home / Self Care | Payer: 59 | Source: Ambulatory Visit | Attending: Family Medicine | Admitting: Family Medicine

## 2017-06-21 ENCOUNTER — Ambulatory Visit (INDEPENDENT_AMBULATORY_CARE_PROVIDER_SITE_OTHER): Payer: 59 | Admitting: Family Medicine

## 2017-06-21 ENCOUNTER — Other Ambulatory Visit: Payer: Self-pay

## 2017-06-21 VITALS — BP 124/72 | HR 92 | Temp 99.3°F | Ht 63.0 in | Wt 110.0 lb

## 2017-06-21 DIAGNOSIS — Z0000 Encounter for general adult medical examination without abnormal findings: Secondary | ICD-10-CM | POA: Insufficient documentation

## 2017-06-21 DIAGNOSIS — M542 Cervicalgia: Secondary | ICD-10-CM

## 2017-06-21 DIAGNOSIS — D696 Thrombocytopenia, unspecified: Secondary | ICD-10-CM

## 2017-06-21 DIAGNOSIS — D8989 Other specified disorders involving the immune mechanism, not elsewhere classified: Secondary | ICD-10-CM | POA: Diagnosis not present

## 2017-06-21 DIAGNOSIS — M549 Dorsalgia, unspecified: Secondary | ICD-10-CM

## 2017-06-21 DIAGNOSIS — D250 Submucous leiomyoma of uterus: Secondary | ICD-10-CM | POA: Insufficient documentation

## 2017-06-21 DIAGNOSIS — D259 Leiomyoma of uterus, unspecified: Secondary | ICD-10-CM | POA: Insufficient documentation

## 2017-06-21 DIAGNOSIS — E039 Hypothyroidism, unspecified: Secondary | ICD-10-CM | POA: Diagnosis not present

## 2017-06-21 DIAGNOSIS — Z0001 Encounter for general adult medical examination with abnormal findings: Secondary | ICD-10-CM

## 2017-06-21 LAB — T4, FREE: Free T4: 0.75 ng/dL (ref 0.60–1.60)

## 2017-06-21 LAB — SEDIMENTATION RATE: Sed Rate: 34 mm/hr — ABNORMAL HIGH (ref 0–20)

## 2017-06-21 LAB — COMPREHENSIVE METABOLIC PANEL
ALT: 27 U/L (ref 0–35)
AST: 29 U/L (ref 0–37)
Albumin: 4.5 g/dL (ref 3.5–5.2)
Alkaline Phosphatase: 56 U/L (ref 39–117)
BUN: 14 mg/dL (ref 6–23)
CHLORIDE: 101 meq/L (ref 96–112)
CO2: 29 meq/L (ref 19–32)
Calcium: 9.6 mg/dL (ref 8.4–10.5)
Creatinine, Ser: 0.59 mg/dL (ref 0.40–1.20)
GFR: 115.97 mL/min (ref >60.00)
Glucose, Bld: 94 mg/dL (ref 70–99)
Potassium: 4.1 mEq/L (ref 3.5–5.1)
Sodium: 138 mEq/L (ref 135–145)
Total Bilirubin: 0.5 mg/dL (ref 0.2–1.2)
Total Protein: 7.6 g/dL (ref 6.0–8.3)

## 2017-06-21 LAB — VITAMIN D 25 HYDROXY (VIT D DEFICIENCY, FRACTURES): VITD: 29.58 ng/mL — ABNORMAL LOW (ref 30.00–100.00)

## 2017-06-21 LAB — CBC WITH DIFFERENTIAL/PLATELET
BASOS PCT: 1.4 % (ref 0.0–3.0)
Basophils Absolute: 0 10*3/uL (ref 0.0–0.1)
Eosinophils Absolute: 0 10*3/uL (ref 0.0–0.7)
Eosinophils Relative: 1.6 % (ref 0.0–5.0)
HEMATOCRIT: 36.5 % (ref 36.0–46.0)
Hemoglobin: 12.3 g/dL (ref 12.0–15.0)
LYMPHS ABS: 0.5 10*3/uL — AB (ref 0.7–4.0)
LYMPHS PCT: 17.2 % (ref 12.0–46.0)
MCHC: 33.7 g/dL (ref 30.0–36.0)
MCV: 92.7 fl (ref 78.0–100.0)
MONOS PCT: 10.1 % (ref 3.0–12.0)
Monocytes Absolute: 0.3 10*3/uL (ref 0.1–1.0)
Neutro Abs: 2 10*3/uL (ref 1.4–7.7)
Neutrophils Relative %: 69.7 % (ref 43.0–77.0)
RBC: 3.94 Mil/uL (ref 3.87–5.11)
RDW: 12.7 % (ref 11.5–15.5)
WBC: 2.8 10*3/uL — ABNORMAL LOW (ref 4.0–10.5)

## 2017-06-21 LAB — LIPID PANEL
CHOL/HDL RATIO: 3
Cholesterol: 199 mg/dL (ref 0–200)
HDL: 59.1 mg/dL (ref >39.00)
LDL CALC: 118 mg/dL — AB (ref 0–99)
NonHDL: 140.29
TRIGLYCERIDES: 109 mg/dL (ref 0.0–149.0)
VLDL: 21.8 mg/dL (ref 0.0–40.0)

## 2017-06-21 LAB — TSH: TSH: 1.87 u[IU]/mL (ref 0.35–4.50)

## 2017-06-21 NOTE — Progress Notes (Signed)
Subjective  Chief Complaint  Patient presents with  . Establish Care    Transfer from [place], Elise [facility]ne  . Annual Exam    [employer] Employee, Pap Today & Mammogram   . Shoulder Pain    HPI: Sarah [name] is a 47 y.o. female who presents to [place] at [facility] today for a Female Wellness Visit.   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 Here for [facility] with pap. Has had interesting past medical history; I have reviewed prior records from Dr. Neldon Mc and [facility]; she likely has an autoimmune disorder: sjogren's or other. She has not yet seen a rheumatologist. 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 At that time, ANA was negative. As years went on, she had infertility issues and has had chronic urticaria. Lab work revealed + autoimmune tests including ANA + and others - see labs. She has had borderline hypothryoidism as well. She endorses dry eyes and dry mouth. No joint pain. No longer having fevers. She feels well overall although she reports her energy levels are not what they used to be. Sister lives in [place]: has lupus.  C/o left neck and upper back pain:related to work positions: on the phone, using computers etc ... Request PT referral as this has helped in past. No new sxs or radicular sxs or weakness.   Infertility: neg eval; failed IVF 4-5 times; considering adoption.   HM: defers mammograms till age 65 due to guideline recs and has dense breast tissue; pap today. Will update me on last Tdap - it's up to date.  Lifestyle: Body mass index is 19.49 kg/m. Wt Readings from Last 3 Encounters:  06/21/17 110 lb (49.9 kg)  09/12/16 111 lb 3.2 oz (50.4 kg)  07/15/14 119 lb 14.9 oz (54.4 kg)   Diet: low fat Exercise: frequently,   Patient Active Problem List     Diagnosis Date Noted  . Fibroid uterus 06/21/2017    Asymptomatic; found during infertility eval   . Chronic urticaria 05/06/2014  . Autoimmune disorder ([place]) 04/08/2012    ? Sjogren's or other- referred to rheum 2019   . Hypothyroidism, unspecified 04/08/2012  . Rhinitis 04/08/2012  . Infertility, female 11/13/2010  . Thrombocytopenia ([place]) 11/13/2010    Overview:  Evaluated by heme onc    Health Maintenance  Topic Date Due  . HIV Screening  02/28/1985  . TETANUS/TDAP  02/28/1989  . PAP SMEAR  03/01/1991  . INFLUENZA VACCINE  08/29/2017   Immunization History  Administered Date(s) Administered  . Influenza-Unspecified 11/13/2010, 12/14/2011   We updated and reviewed the patient's past history in detail and it is documented below. Allergies: Patient is allergic to lactose intolerance (gi) and tape. Past Medical History Patient  has a past medical history of ANA positive, Gall stones, Hypothyroidism, Infertility, female, Lactose intolerance, Urticaria, and Vitamin D deficiency disease. Past Surgical History Patient  has a past surgical history that includes Wisdom tooth extraction; Endometrial biopsy; Fertility Surgery (~ 2008; ~ 2011; ~ 2012); Laparoscopic cholecystectomy (07/13/2014); Cholecystectomy (N/A, 07/13/2014); and Anterior cruciate ligament repair (Left, ~ 2005). Family History: Patient family history includes Lupus in her sister. Social History:  Patient  reports that she has never smoked. She has never used smokeless tobacco. She reports that she does  not drink alcohol or use drugs.  Review of Systems: Constitutional: negative for fever or malaise Ophthalmic: negative for photophobia, double vision or loss of vision Cardiovascular: negative for chest pain, dyspnea on exertion, or new LE swelling Respiratory: negative for SOB or persistent cough Gastrointestinal: negative for abdominal pain, change in bowel habits or melena Genitourinary: negative for dysuria  or gross hematuria, no abnormal uterine bleeding or disharge Musculoskeletal: negative for new gait disturbance or muscular weakness Integumentary: negative for new or persistent rashes, no breast lumps Neurological: negative for TIA or stroke symptoms Psychiatric: negative for SI or delusions Allergic/Immunologic: negative for hives  Patient Care Team    Relationship Specialty Notifications Start End  Leamon Arnt, MD PCP - General Family Medicine  06/21/17   Jiles Prows, MD Consulting Physician Allergy and Immunology  06/21/17     Objective  Vitals: BP 124/72   Pulse 92   Temp 99.3 F (37.4 C)   Ht 5\' 3"  (1.6 m)   Wt 110 lb (49.9 kg)   LMP 06/14/2017   BMI 19.49 kg/m  General:  Well developed, well nourished, no acute distress  Psych:  Alert and orientedx3,normal mood and affect HEENT:  Normocephalic, atraumatic, non-icteric sclera, PERRL, oropharynx is clear without mass or exudate, supple neck without adenopathy, mass or thyromegaly Cardiovascular:  Normal S1, S2, RRR without gallop, rub or murmur, nondisplaced PMI Respiratory:  Good breath sounds bilaterally, CTAB with normal respiratory effort Gastrointestinal: normal bowel sounds, soft, non-tender, no noted masses. No HSM MSK: no deformities, contusions. Joints are without erythema or swelling. Spine and CVA region are nontender, left trap ttp; shoulders normal.  Skin:  Warm, has several macules on anterior chest; hyperpigmented area on left upper back Neurologic:    Mental status is normal. CN 2-11 are normal. Gross motor and sensory exams are normal. Normal gait. No tremor Breast Exam: No mass, skin retraction or nipple discharge is appreciated in either breast. No axillary adenopathy. Fibrocystic changes are not noted Pelvic Exam: Normal external genitalia, no vulvar or vaginal lesions present. Clear cervix w/o CMT. Bimanual exam reveals a nontender fundus w/o masses, nl size. No adnexal masses present. No inguinal  adenopathy. A PAP smear was performed.   Assessment  1. Physical exam   2. Upper back pain   3. Neck pain   4. Autoimmune disorder ([place])   5. Hypothyroidism, unspecified type   6. Thrombocytopenia ([place]) Chronic     Plan  Female Wellness Visit:  Age appropriate Health Maintenance and Prevention measures were discussed with patient. Included topics are cancer screening recommendations, ways to keep healthy (see AVS) including dietary and exercise recommendations, regular eye and dental care, use of seat belts, and avoidance of moderate alcohol use and tobacco use. Pap done.  BMI: discussed patient's BMI and encouraged positive lifestyle modifications to help get to or maintain a target BMI.  HM needs and immunizations were addressed and ordered. See below for orders. See HM and immunization section for updates.  Routine labs and screening tests ordered including cmp, cbc and lipids where appropriate.  Discussed recommendations regarding Vit D and calcium supplementation (see AVS)  Discussed benefit of rheum eval for further clarification on sjogren's or other. She agrees.   Check thyroid function; supplement if indicated. May be subclinical.   Refer to PT for msk neck/back pain.   Follow up: Return in about 1 year (around 06/22/2018) for complete physical.   Commons side effects, risks, benefits, and alternatives for medications and  treatment plan prescribed today were discussed, and the patient expressed understanding of the given instructions. Patient is instructed to call or message via MyChart if he/she has any questions or concerns regarding our treatment plan. No barriers to understanding were identified. We discussed Red Flag symptoms and signs in detail. Patient expressed understanding regarding what to do in case of urgent or emergency type symptoms.   Medication list was reconciled, printed and provided to the patient in AVS. Patient instructions and summary information was  reviewed with the patient as documented in the AVS. This note was prepared with assistance of Dragon voice recognition software. Occasional wrong-word or sound-a-like substitutions may have occurred due to the inherent limitations of voice recognition software  Orders Placed This Encounter  Procedures  . CBC with Differential/Platelet  . Comprehensive metabolic panel  . Lipid panel  . HIV antibody  . TSH  . T4, free  . Thyroid peroxidase antibody  . VITAMIN D 25 Hydroxy (Vit-D Deficiency, Fractures)  . Sedimentation rate  . Ambulatory referral to Physical Therapy  . Ambulatory referral to Rheumatology   No orders of the defined types were placed in this encounter.

## 2017-06-21 NOTE — Patient Instructions (Addendum)
Thank you for establishing and allowing me to care for you. It means a lot to me.   Please schedule a follow up appointment with me in 1 year for your physical  Please go to the Lab for blood work.    If you have MyChart, your results will be available to view, please respond through [place] with questions.  We will schedule follow-up according to results.  Your referral for PT & Rheumatology have been placed. You will get a call for scheduling.    Please do these things to maintain good health!   Exercise at least 30-45 minutes a day,  4-5 days a week.   Eat a low-fat diet with lots of fruits and vegetables, up to 7-9 servings per day.  Drink plenty of water daily. Try to drink 8 8oz glasses per day.  Seatbelts can save your life. Always wear your seatbelt.  Place Smoke Detectors on every level of your home and check batteries every year.  Schedule an appointment with an eye doctor for an eye exam every 1-2 years  Safe sex - use condoms to protect yourself from STDs if you could be exposed to these types of infections. Use birth control if you do not want to become pregnant and are sexually active.  Avoid heavy alcohol use. If you drink, keep it to less than 2 drinks/day and not every day.  [place].  Choose someone you trust that could speak for you if you became unable to speak for yourself.  Depression is common in our stressful world.If you're feeling down or losing interest in things you normally enjoy, please come in for a visit.  If anyone is threatening or hurting you, please get help. Physical or Emotional Violence is never OK.

## 2017-06-26 ENCOUNTER — Encounter: Payer: Self-pay | Admitting: Family Medicine

## 2017-06-28 LAB — HIV ANTIBODY (ROUTINE TESTING W REFLEX): HIV 1&2 Ab, 4th Generation: NONREACTIVE

## 2017-06-28 LAB — THYROID PEROXIDASE ANTIBODY: Thyroperoxidase Ab SerPl-aCnc: 46 IU/mL — ABNORMAL HIGH (ref <9)

## 2017-06-28 NOTE — Telephone Encounter (Signed)
Please set up for [facility] lab visit on June 19 as requested. Dx: leukopenia.

## 2017-07-02 ENCOUNTER — Encounter: Payer: Self-pay | Admitting: Family Medicine

## 2017-07-02 DIAGNOSIS — D61818 Other pancytopenia: Secondary | ICD-10-CM | POA: Insufficient documentation

## 2017-07-02 LAB — CYTOLOGY - PAP
Diagnosis: NEGATIVE
HPV (WINDOPATH): NOT DETECTED

## 2017-07-04 ENCOUNTER — Encounter: Payer: Self-pay | Admitting: Physical Therapy

## 2017-07-04 ENCOUNTER — Ambulatory Visit (INDEPENDENT_AMBULATORY_CARE_PROVIDER_SITE_OTHER): Payer: 59 | Admitting: Physical Therapy

## 2017-07-04 DIAGNOSIS — M542 Cervicalgia: Secondary | ICD-10-CM | POA: Diagnosis not present

## 2017-07-04 NOTE — Patient Instructions (Signed)
Access Code: [email or web address]  URL: [email or web address]  Date: 07/04/2017  Prepared by: Lyndee Hensen   Exercises  Seated Cervical Retraction - 10 reps - 2 sets - 2x daily  Seated Cervical Rotation AROM - 10 reps - 2 sets - 2x daily  Seated Shoulder Rolls - 10 reps - 2 sets - 2x daily  Standing Scapular Retraction - 10 reps - 2 sets - 2x daily  Seated Cervical Sidebending Stretch - 3 sets - 30 hold - 2x daily  Doorway Pec Stretch at 60 Degrees Abduction - 3 sets - 30 hold - 2x daily  Supine Chest Stretch on Foam Roll - 10 reps - 2 sets - 2x daily

## 2017-07-04 NOTE — Therapy (Signed)
[facility] [address] [place], [place], [address] Phone: [phone]   Fax:  [phone]  Physical Therapy Evaluation  Patient Details  Name: Sarah [name] MRN: [id number] Date of Birth: Sep 02, 1970 Referring Provider: Billey Chang   Encounter Date: 07/04/2017  PT End of Session - 07/04/17 1016    Visit Number  1    Number of Visits  16    Date for PT Re-Evaluation  08/29/17    Authorization Type  UMR    PT Start Time  0925    PT Stop Time  1005    PT Time Calculation (min)  40 min    Activity Tolerance  Patient tolerated treatment well    Behavior During Therapy  [facility] for tasks assessed/performed       Past Medical History:  Diagnosis Date  . ANA positive    ?Sjogrens  . Gall stones   . Hypothyroidism    Takes synthroid  . Infertility, female   . Lactose intolerance   . Urticaria   . Vitamin D deficiency disease    Takes Vit D    Past Surgical History:  Procedure Laterality Date  . ANTERIOR CRUCIATE LIGAMENT REPAIR Left ~ 2005  . CHOLECYSTECTOMY N/A 07/13/2014   Procedure: LAPAROSCOPIC CHOLECYSTECTOMY WITH INTRAOPERATIVE CHOLANGIOGRAM;  Surgeon: Rolm Bookbinder, MD;  Location: [facility];  Service: General;  Laterality: N/A;  . ENDOMETRIAL BIOPSY    . FERTILITY SURGERY  ~ 2008; ~ 2011; ~ 2012  . LAPAROSCOPIC CHOLECYSTECTOMY  07/13/2014  . WISDOM TOOTH EXTRACTION      There were no vitals filed for this visit.   Subjective Assessment - 07/04/17 1010    Subjective  Pt states previous history of neck pain, that resolved with PT, 2 bouts, a few years apart. Pt states increased pain in last month or so, increased in B, L>R neck and upper back. 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 She is not currently exercising. Denies headaches, or numbness/tingling down L UE.     Limitations  Sitting;House hold activities;Writing;Reading    Patient Stated Goals  Decreased pain    Currently in Pain?  Yes    Pain  Score  5     Pain Location  Neck    Pain Orientation  Left;Right    Pain Descriptors / Indicators  Aching;Tightness    Pain Type  Chronic pain    Pain Onset  More than a month ago    Pain Frequency  Intermittent         [facility] PT Assessment - 07/04/17 1029      Assessment   Medical Diagnosis  Neck Pain    Referring Provider  Billey Chang    Hand Dominance  Left    Prior Therapy  Yes      Precautions   Precautions  None      Balance Screen   Has the patient fallen in the past 6 months  No      Prior Function   Level of Independence  Independent      Cognition   Overall Cognitive Status  Within Functional Limits for tasks assessed      Posture/Postural Control   Posture Comments  Flattened lumbar region, Slight lumbar shift to L      ROM / Strength   AROM / PROM / Strength  AROM;Strength      AROM   Overall AROM Comments  Cervical: Mild limitations with ROm and  pain,  at end range of  extension, R rotation and SB, due to L tightness. ; B shoulder ROM: WNL      Strength   Overall Strength Comments  Shoulder: 4/5;  Rhomboid: 4-/5;       Palpation   Palpation comment  Mild decrease in L side glide ROM; Tightness in sub occipitals, L upper trap, levator, rhomboid.  + ULTT on L;       Special Tests   Other special tests  Denies headache. Negative Radicular pain;                 Objective measurements completed on examination: See above findings.      [facility] Adult PT Treatment/Exercise - 07/04/17 1029      Exercises   Exercises  Neck      Neck Exercises: Seated   Neck Retraction  20 reps    Other Seated Exercise  Scap Squeeze x20;       Manual Therapy   Manual Therapy  Soft tissue mobilization;Passive ROM;Manual Traction;Joint mobilization      Neck Exercises: Stretches   Upper Trapezius Stretch  2 reps;30 seconds    Levator Stretch  2 reps;30 seconds    Corner Stretch  3 reps;30 seconds    Corner Stretch Limitations  45 and 90 deg    Chest  Stretch  60 seconds    Chest Stretch Limitations  Pec stretc on table with nerve glide 2x20             PT Education - 07/04/17 1016    Education Details  initial HEP for posture at work.     Person(s) Educated  Patient    Methods  Explanation;Handout    Comprehension  Verbalized understanding;Need further instruction       PT Short Term Goals - 07/04/17 1024      PT SHORT TERM GOAL #1   Title  Pt to demo independence with initial HEP     Time  3    Period  Weeks    Status  New    Target Date  07/25/17      PT SHORT TERM GOAL #2   Title  Pt to report decreased pain in cervical region, to 3/10         PT Long Term Goals - 07/04/17 1025      PT LONG TERM GOAL #1   Title  Pt to report decreased pain in cervical region, to 0-2/10 with comuputer work, to improve ability for work duties.     Time  8    Period  Weeks    Status  New    Target Date  08/29/17      PT LONG TERM GOAL #2   Title  Pt to demo ability for full cervical ROM, without pain, to improve ability for driving and IADLs.     Time  8    Period  Weeks    Status  New    Target Date  08/29/17      PT LONG TERM GOAL #3   Title  Pt to demo increased strength of shoulder and postural muscles to be at least 4+/5 to improve endurance and pain with work duties .    Time  8    Period  Weeks    Status  New    Target Date  08/29/17      PT LONG TERM GOAL #4   Title  Pt to be indpendent with  final HEP for postural and cervical ROM and strength.     Time  8    Period  Weeks    Status  New    Target Date  08/29/17             Plan - 07/04/17 1017    Clinical Impression Statement  Pt presents with primary complaint of increased pain in cervical region bilaterally L>R. She has increased tightness and limitation in cervical and postural muscles, tenderness in uppre traps, levator, and trigger points in same areas as well. She has mild ROM limitation, but has tightness and pain with full motions of  rotation and extension. She has weakness in cervical and postural muscles and will benefit from education on HEP for this. Pt with decreased ability for full functional activities, reading, computer work, reaching, carrying, and work duties, due to pain. Pt to benefit form skilled PT to improve deficits.     Clinical Presentation  Stable    Clinical Decision Making  Low    Rehab Potential  Good    Clinical Impairments Affecting Rehab Potential  Pt may only be able to attend 1x/wk or less due to work schedule     PT Frequency  2x / week    PT Duration  8 weeks    PT Treatment/Interventions  ADLs/Self Care Home Management;Electrical Stimulation;Iontophoresis 4mg /ml Dexamethasone;Moist Heat;Traction;Ultrasound;Functional mobility training;Therapeutic activities;Orthotic Fit/Training;Patient/family education;Neuromuscular re-education;Therapeutic exercise;Manual techniques;Passive range of motion;Dry needling;Taping    PT Next Visit Plan  Postural strengthening, manual for neck     Consulted and Agree with Plan of Care  Patient       Patient will benefit from skilled therapeutic intervention in order to improve the following deficits and impairments:  Decreased activity tolerance, Decreased strength, Pain, Increased muscle spasms, Postural dysfunction, Improper body mechanics, Hypermobility  Visit Diagnosis: Neck pain     Problem List Patient Active Problem List   Diagnosis Date Noted  . Pancytopenia ([place]) 07/02/2017  . Fibroid uterus 06/21/2017  . Chronic urticaria 05/06/2014  . Autoimmune disorder ([place]) 04/08/2012  . Hypothyroidism, unspecified 04/08/2012  . Rhinitis 04/08/2012  . Infertility, female 11/13/2010  . Thrombocytopenia ([place]) 11/13/2010    Lyndee Hensen, PT, DPT 10:41 AM  07/04/17   [facility] [place], [place], [address] Phone: [phone]   Fax:  [phone]  Name: Sarah [name] MRN: [id number] Date of Birth:  04/23/1970

## 2017-07-09 ENCOUNTER — Ambulatory Visit (INDEPENDENT_AMBULATORY_CARE_PROVIDER_SITE_OTHER): Payer: 59 | Admitting: Physical Therapy

## 2017-07-09 DIAGNOSIS — M542 Cervicalgia: Secondary | ICD-10-CM | POA: Diagnosis not present

## 2017-07-09 NOTE — Therapy (Signed)
[facility] [address] [place], [place], [address] Phone: [phone]   Fax:  [phone]  Physical Therapy Treatment  Patient Details  Name: Sarah [name] MRN: [id number] Date of Birth: 1970/05/18 Referring Provider: Billey Chang   Encounter Date: 07/09/2017  PT End of Session - 07/09/17 1047    Visit Number  2    Number of Visits  16    Date for PT Re-Evaluation  08/29/17    Authorization Type  UMR    PT Start Time  0844    PT Stop Time  0944    PT Time Calculation (min)  60 min    Activity Tolerance  Patient tolerated treatment well    Behavior During Therapy  [facility] for tasks assessed/performed       Past Medical History:  Diagnosis Date  . ANA positive    ?Sjogrens  . Gall stones   . Hypothyroidism    Takes synthroid  . Infertility, female   . Lactose intolerance   . Urticaria   . Vitamin D deficiency disease    Takes Vit D    Past Surgical History:  Procedure Laterality Date  . ANTERIOR CRUCIATE LIGAMENT REPAIR Left ~ 2005  . CHOLECYSTECTOMY N/A 07/13/2014   Procedure: LAPAROSCOPIC CHOLECYSTECTOMY WITH INTRAOPERATIVE CHOLANGIOGRAM;  Surgeon: Rolm Bookbinder, MD;  Location: [facility];  Service: General;  Laterality: N/A;  . ENDOMETRIAL BIOPSY    . FERTILITY SURGERY  ~ 2008; ~ 2011; ~ 2012  . LAPAROSCOPIC CHOLECYSTECTOMY  07/13/2014  . WISDOM TOOTH EXTRACTION      There were no vitals filed for this visit.  Subjective Assessment - 07/09/17 1046    Subjective  Pt states mild pain/soreness today, states constant discomfort, mostly with work duties, sitting, and computer work.     Currently in Pain?  Yes    Pain Score  3     Pain Location  Neck    Pain Orientation  Right;Left    Pain Descriptors / Indicators  Aching;Tightness    Pain Type  Chronic pain    Pain Onset  More than a month ago    Pain Frequency  Intermittent                       [facility] Adult PT Treatment/Exercise - 07/09/17 0837      Posture/Postural Control   Posture Comments  Flattened lumbar region, Slight lumbar shift to L      Exercises   Exercises  Neck      Neck Exercises: Standing   Thumb Tacks  Rows RTB x20;  Low ROw RTB x20    Other Standing Exercises  Standing with noodle at wall for posture, UE scaption and abd each x15;       Neck Exercises: Seated   Neck Retraction  20 reps    Other Seated Exercise  --      Neck Exercises: Supine   Shoulder Flexion  20 reps;Limitations    Shoulder Flexion Weights (lbs)  1    Shoulder Flexion Limitations  with TA    Shoulder ABduction  20 reps    Shoulder Abduction Weights (lbs)  1    Shoulder Abduction Limitations  Horizontal ABD with TA      Neck Exercises: Prone   Other Prone Exercise  Prone T x15 bil;      Manual Therapy   Manual Therapy  Soft tissue mobilization;Passive ROM;Manual Traction;Joint mobilization    Joint Mobilization  Cervical Side glides    Soft tissue mobilization  STM/ DTM to L UT and bil Sub occipital region; Sub occipital release    Manual Traction  10 sec x10;       Neck Exercises: Stretches   Upper Trapezius Stretch  2 reps;30 seconds    Levator Stretch  2 reps;30 seconds    Corner Stretch  3 reps;30 seconds    Corner Stretch Limitations  45 and 90 deg    Chest Stretch  --    Chest Stretch Limitations  --               PT Short Term Goals - 07/04/17 1024      PT SHORT TERM GOAL #1   Title  Pt to demo independence with initial HEP     Time  3    Period  Weeks    Status  New    Target Date  07/25/17      PT SHORT TERM GOAL #2   Title  Pt to report decreased pain in cervical region, to 3/10         PT Long Term Goals - 07/04/17 1025      PT LONG TERM GOAL #1   Title  Pt to report decreased pain in cervical region, to 0-2/10 with comuputer work, to improve ability for work duties.     Time  8    Period  Weeks    Status  New    Target Date  08/29/17      PT LONG TERM GOAL #2   Title  Pt to demo ability for  full cervical ROM, without pain, to improve ability for driving and IADLs.     Time  8    Period  Weeks    Status  New    Target Date  08/29/17      PT LONG TERM GOAL #3   Title  Pt to demo increased strength of shoulder and postural muscles to be at least 4+/5 to improve endurance and pain with work duties .    Time  8    Period  Weeks    Status  New    Target Date  08/29/17      PT LONG TERM GOAL #4   Title  Pt to be indpendent with final HEP for postural and cervical ROM and strength.     Time  8    Period  Weeks    Status  New    Target Date  08/29/17            Plan - 07/09/17 1049    Clinical Impression Statement  Pt obtained posture strap, educated on use and fit today. Pt with tightness and soreness in B sub occipital region and L UT today, addressed with manual therapy, as well as ther ex for stretching and strengthening. Pt to benefit from continued education on postural strengthening. Pt wiith difficulty maintaining scapular and core stabilization with UE ROM.     Rehab Potential  Good    Clinical Impairments Affecting Rehab Potential  Pt may only be able to attend 1x/wk or less due to work schedule     PT Frequency  2x / week    PT Duration  8 weeks    PT Treatment/Interventions  ADLs/Self Care Home Management;Electrical Stimulation;Iontophoresis 4mg /ml Dexamethasone;Moist Heat;Traction;Ultrasound;Functional mobility training;Therapeutic activities;Orthotic Fit/Training;Patient/family education;Neuromuscular re-education;Therapeutic exercise;Manual techniques;Passive range of motion;Dry needling;Taping    PT Next Visit Plan  Postural strengthening, manual  for neck     Consulted and Agree with Plan of Care  Patient       Patient will benefit from skilled therapeutic intervention in order to improve the following deficits and impairments:  Decreased activity tolerance, Decreased strength, Pain, Increased muscle spasms, Postural dysfunction, Improper body mechanics,  Hypermobility  Visit Diagnosis: Neck pain     Problem List Patient Active Problem List   Diagnosis Date Noted  . Pancytopenia ([place]) 07/02/2017  . Fibroid uterus 06/21/2017  . Chronic urticaria 05/06/2014  . Autoimmune disorder ([place]) 04/08/2012  . Hypothyroidism, unspecified 04/08/2012  . Rhinitis 04/08/2012  . Infertility, female 11/13/2010  . Thrombocytopenia ([place]) 11/13/2010    Lyndee Hensen, PT, DPT 10:55 AM  07/09/17    [facility] [place], [place], [address] Phone: [phone]   Fax:  [phone]  Name: Heaven Meeker MRN: [id number] Date of Birth: 08-05-1970

## 2017-07-17 ENCOUNTER — Encounter: Payer: Self-pay | Admitting: Physical Therapy

## 2017-07-17 ENCOUNTER — Ambulatory Visit (INDEPENDENT_AMBULATORY_CARE_PROVIDER_SITE_OTHER): Payer: 59 | Admitting: Physical Therapy

## 2017-07-17 DIAGNOSIS — M542 Cervicalgia: Secondary | ICD-10-CM

## 2017-07-17 NOTE — Therapy (Signed)
[facility] [address] [place], [place], [address] Phone: [phone]   Fax:  [phone]  Physical Therapy Treatment  Patient Details  Name: Sarah [name] MRN: [id number] Date of Birth: 03-Jun-1970 Referring Provider: Billey Chang   Encounter Date: 07/17/2017  PT End of Session - 07/17/17 1143    Visit Number  3    Number of Visits  16    Date for PT Re-Evaluation  08/29/17    Authorization Type  UMR    PT Start Time  0802    PT Stop Time  0850    PT Time Calculation (min)  48 min    Activity Tolerance  Patient tolerated treatment well    Behavior During Therapy  [facility] for tasks assessed/performed       Past Medical History:  Diagnosis Date  . ANA positive    ?Sjogrens  . Gall stones   . Hypothyroidism    Takes synthroid  . Infertility, female   . Lactose intolerance   . Urticaria   . Vitamin D deficiency disease    Takes Vit D    Past Surgical History:  Procedure Laterality Date  . ANTERIOR CRUCIATE LIGAMENT REPAIR Left ~ 2005  . CHOLECYSTECTOMY N/A 07/13/2014   Procedure: LAPAROSCOPIC CHOLECYSTECTOMY WITH INTRAOPERATIVE CHOLANGIOGRAM;  Surgeon: Rolm Bookbinder, MD;  Location: [facility];  Service: General;  Laterality: N/A;  . ENDOMETRIAL BIOPSY    . FERTILITY SURGERY  ~ 2008; ~ 2011; ~ 2012  . LAPAROSCOPIC CHOLECYSTECTOMY  07/13/2014  . WISDOM TOOTH EXTRACTION      There were no vitals filed for this visit.  Subjective Assessment - 07/17/17 1142    Subjective  Pt states that "nothing is worse". She had slightly less pain this week while at work.     Patient Stated Goals  Decreased pain    Currently in Pain?  Yes    Pain Score  3     Pain Location  Neck    Pain Orientation  Right;Left    Pain Descriptors / Indicators  Aching;Tightness    Pain Type  Chronic pain    Pain Onset  More than a month ago    Pain Frequency  Intermittent    Aggravating Factors   work duties, Teaching laboratory technician, phone    Pain Relieving Factors  none                        [facility] Adult PT Treatment/Exercise - 07/17/17 0801      Posture/Postural Control   Posture Comments  Flattened lumbar region, Slight lumbar shift to L      Exercises   Exercises  Neck      Neck Exercises: Standing   Thumb Tacks  --    Other Standing Exercises  Rows RTB x20;  Low ROw RTB x20    Other Standing Exercises  Standing at wall for posture, UE scaption x20 (1lb);  wall angels x20;        Neck Exercises: Seated   Neck Retraction  20 reps      Neck Exercises: Supine   Shoulder Flexion  20 reps;Limitations    Shoulder Flexion Weights (lbs)  1    Shoulder Flexion Limitations  with TA    Shoulder ABduction  --    Shoulder Abduction Weights (lbs)  --    Other Supine Exercise  HOrizontal Abd 1 lb x 20      Neck Exercises: Prone   Other  Prone Exercise  Prone T x20 bil;      Manual Therapy   Manual Therapy  Soft tissue mobilization;Passive ROM;Manual Traction;Joint mobilization    Joint Mobilization  Cervical Side glides    Soft tissue mobilization  STM/ DTM to L UT and bil Sub occipital region; Sub occipital release    Manual Traction  10 sec x6;       Neck Exercises: Stretches   Upper Trapezius Stretch  2 reps;30 seconds    Levator Stretch  2 reps;30 seconds    Corner Stretch  3 reps;30 seconds    Corner Stretch Limitations  45 deg               PT Short Term Goals - 07/04/17 1024      PT SHORT TERM GOAL #1   Title  Pt to demo independence with initial HEP     Time  3    Period  Weeks    Status  New    Target Date  07/25/17      PT SHORT TERM GOAL #2   Title  Pt to report decreased pain in cervical region, to 3/10         PT Long Term Goals - 07/04/17 1025      PT LONG TERM GOAL #1   Title  Pt to report decreased pain in cervical region, to 0-2/10 with comuputer work, to improve ability for work duties.     Time  8    Period  Weeks    Status  New    Target Date  08/29/17      PT LONG TERM GOAL #2   Title   Pt to demo ability for full cervical ROM, without pain, to improve ability for driving and IADLs.     Time  8    Period  Weeks    Status  New    Target Date  08/29/17      PT LONG TERM GOAL #3   Title  Pt to demo increased strength of shoulder and postural muscles to be at least 4+/5 to improve endurance and pain with work duties .    Time  8    Period  Weeks    Status  New    Target Date  08/29/17      PT LONG TERM GOAL #4   Title  Pt to be indpendent with final HEP for postural and cervical ROM and strength.     Time  8    Period  Weeks    Status  New    Target Date  08/29/17            Plan - 07/17/17 1144    Clinical Impression Statement  Pt with tightness and tenderness at L UT, cervical paraspinals, and levator. DTM done today to address. Plan to do dry needling next session to improve trigger points, and pain. Progressed ther ex with postural and UE strengthening, pt requires cueing for mechanics and posture for shoulder and lumbar region; pt with tendency for anterior pelvic tilt with UE elevation.     Rehab Potential  Good    Clinical Impairments Affecting Rehab Potential  Pt may only be able to attend 1x/wk or less due to work schedule     PT Frequency  2x / week    PT Duration  8 weeks    PT Treatment/Interventions  ADLs/Self Care Home Management;Electrical Stimulation;Iontophoresis 4mg /ml Dexamethasone;Moist Heat;Traction;Ultrasound;Functional mobility training;Therapeutic activities;Orthotic Fit/Training;Patient/family education;Neuromuscular re-education;Therapeutic  exercise;Manual techniques;Passive range of motion;Dry needling;Taping    PT Next Visit Plan  Postural strengthening, manual for neck , dry needling    Consulted and Agree with Plan of Care  Patient       Patient will benefit from skilled therapeutic intervention in order to improve the following deficits and impairments:  Decreased activity tolerance, Decreased strength, Pain, Increased muscle  spasms, Postural dysfunction, Improper body mechanics, Hypermobility  Visit Diagnosis: Neck pain     Problem List Patient Active Problem List   Diagnosis Date Noted  . Pancytopenia ([place]) 07/02/2017  . Fibroid uterus 06/21/2017  . Chronic urticaria 05/06/2014  . Autoimmune disorder ([place]) 04/08/2012  . Hypothyroidism, unspecified 04/08/2012  . Rhinitis 04/08/2012  . Infertility, female 11/13/2010  . Thrombocytopenia ([place]) 11/13/2010    Lyndee Hensen, PT, DPT 11:47 AM  07/17/17    [facility] [place], [place], [address] Phone: [phone]   Fax:  [phone]  Name: Jamar Casagrande MRN: [id number] Date of Birth: [birth year]

## 2017-07-23 ENCOUNTER — Encounter: Payer: Self-pay | Admitting: Family Medicine

## 2017-07-23 MED ORDER — CLOBETASOL PROPIONATE 0.05 % EX OINT: 1.0000 "application " | TOPICAL_OINTMENT | Freq: Two times a day (BID) | CUTANEOUS | 0 refills | Status: DC

## 2017-07-23 MED ORDER — DESONIDE 0.05 % EX CREA: TOPICAL_CREAM | Freq: Two times a day (BID) | CUTANEOUS | 0 refills | Status: DC

## 2017-07-23 MED FILL — DESONIDE 0.05% CREAM: 0.05 | 10 days supply | Qty: 30 | Fill #0

## 2017-07-23 MED FILL — CLOBETASOL PROPIONATE 0.05: 0.05 | 7 days supply | Qty: 30 | Fill #0

## 2017-07-24 ENCOUNTER — Encounter: Payer: Self-pay | Admitting: Physical Therapy

## 2017-07-24 ENCOUNTER — Ambulatory Visit (INDEPENDENT_AMBULATORY_CARE_PROVIDER_SITE_OTHER): Payer: 59 | Admitting: Physical Therapy

## 2017-07-24 DIAGNOSIS — M542 Cervicalgia: Secondary | ICD-10-CM

## 2017-07-24 NOTE — Therapy (Signed)
[facility] [address] [place], [place], [address] Phone: [phone]   Fax:  [phone]  Physical Therapy Treatment  Patient Details  Name: Sarah [name] MRN: [id number] Date of Birth: [birth year] Referring Provider: Billey Chang   Encounter Date: 07/24/2017  PT End of Session - 07/24/17 0826    Visit Number  4    Number of Visits  16    Date for PT Re-Evaluation  08/29/17    Authorization Type  UMR    PT Start Time  0802    PT Stop Time  0845    PT Time Calculation (min)  43 min    Activity Tolerance  Patient tolerated treatment well    Behavior During Therapy  [facility] for tasks assessed/performed       Past Medical History:  Diagnosis Date  . ANA positive    ?Sjogrens  . Gall stones   . Hypothyroidism    Takes synthroid  . Infertility, female   . Lactose intolerance   . Urticaria   . Vitamin D deficiency disease    Takes Vit D    Past Surgical History:  Procedure Laterality Date  . ANTERIOR CRUCIATE LIGAMENT REPAIR Left ~ 2005  . CHOLECYSTECTOMY N/A 07/13/2014   Procedure: LAPAROSCOPIC CHOLECYSTECTOMY WITH INTRAOPERATIVE CHOLANGIOGRAM;  Surgeon: Rolm Bookbinder, MD;  Location: [facility];  Service: General;  Laterality: N/A;  . ENDOMETRIAL BIOPSY    . FERTILITY SURGERY  ~ 2008; ~ 2011; ~ 2012  . LAPAROSCOPIC CHOLECYSTECTOMY  07/13/2014  . WISDOM TOOTH EXTRACTION      There were no vitals filed for this visit.  Subjective Assessment - 07/24/17 0825    Subjective  Pt states she is feeling somewhat better, and feels ROM is better.     Currently in Pain?  Yes    Pain Score  3     Pain Location  Neck    Pain Orientation  Right;Left    Pain Descriptors / Indicators  Aching;Tightness    Pain Type  Chronic pain    Pain Onset  More than a month ago    Pain Frequency  Intermittent                       [facility] Adult PT Treatment/Exercise - 07/24/17 0831      Posture/Postural Control   Posture Comments   Flattened lumbar region, Slight lumbar shift to L      Exercises   Exercises  Neck      Neck Exercises: Standing   Other Standing Exercises  Rows GTB x20;  Low ROw RTB x20    Other Standing Exercises  Standing at wall for posture, UE scaption x20 (1lb);         Neck Exercises: Seated   Neck Retraction  20 reps      Neck Exercises: Supine   Shoulder Flexion  20 reps;Limitations    Shoulder Flexion Weights (lbs)  2    Shoulder Flexion Limitations  with TA    Other Supine Exercise  HOrizontal Abd 2 lb x 20    Other Supine Exercise  Bridging x20      Neck Exercises: Prone   Other Prone Exercise  --      Manual Therapy   Manual Therapy  Soft tissue mobilization;Passive ROM;Manual Traction;Joint mobilization    Manual therapy comments  skilled palpation and monitoring of soft tissue during DN    Joint Mobilization  Cervical Side glidesand PA mobs  Soft tissue mobilization  --    Passive ROM  Manual stretching for Bil UT;     Manual Traction  10 sec x6;       Neck Exercises: Stretches   Upper Trapezius Stretch  2 reps;30 seconds    Levator Stretch  --    Engineer, water Limitations  --       Trigger Point Dry Needling - 07/24/17 3710    Consent Given?  Yes    Muscles Treated Upper Body  Upper trapezius    Upper Trapezius Response  Twitch reponse elicited;Palpable increased muscle length L and R upper trap            PT Education - 07/24/17 0943    Education Details  on dry needling.     Person(s) Educated  Patient    Methods  Explanation    Comprehension  Verbalized understanding       PT Short Term Goals - 07/04/17 1024      PT SHORT TERM GOAL #1   Title  Pt to demo independence with initial HEP     Time  3    Period  Weeks    Status  New    Target Date  07/25/17      PT SHORT TERM GOAL #2   Title  Pt to report decreased pain in cervical region, to 3/10         PT Long Term Goals - 07/04/17 1025      PT LONG TERM GOAL #1    Title  Pt to report decreased pain in cervical region, to 0-2/10 with comuputer work, to improve ability for work duties.     Time  8    Period  Weeks    Status  New    Target Date  08/29/17      PT LONG TERM GOAL #2   Title  Pt to demo ability for full cervical ROM, without pain, to improve ability for driving and IADLs.     Time  8    Period  Weeks    Status  New    Target Date  08/29/17      PT LONG TERM GOAL #3   Title  Pt to demo increased strength of shoulder and postural muscles to be at least 4+/5 to improve endurance and pain with work duties .    Time  8    Period  Weeks    Status  New    Target Date  08/29/17      PT LONG TERM GOAL #4   Title  Pt to be indpendent with final HEP for postural and cervical ROM and strength.     Time  8    Period  Weeks    Status  New    Target Date  08/29/17            Plan - 07/24/17 0950    Clinical Impression Statement  Pt with improvements in rotation ROM. Ther ex done today for core stabilization with UE ROM. With UE motion, Pt has difficulty keeping back from arching and also gets compensation from Upper traps. Pt with good response from dry needling today in upper traps. Plan to continue postural strengthening as tolerated. Patient Consent: After explanation of Trigger Point Dry Needling (TDN)  rationale, procedures, outcomes, and potential side effects, patient verbalized consent to TDN treatment.Post treatment instructions: Patient instructed to expect mild to moderate muscle  soreness this evening and tomorrow. Pt instructed to continue prescribed home exercise program. If dry needling over lung field, patient instructed of signs and symptoms of pneumothorax. Although unlikely, patient educated on seeking immediate medical attention, should symptoms occur. Pt verbalized understanding of these instructions.     Rehab Potential  Good    Clinical Impairments Affecting Rehab Potential  Pt may only be able to attend 1x/wk or less due  to work schedule     PT Frequency  2x / week    PT Duration  8 weeks    PT Treatment/Interventions  ADLs/Self Care Home Management;Electrical Stimulation;Iontophoresis 4mg /ml Dexamethasone;Moist Heat;Traction;Ultrasound;Functional mobility training;Therapeutic activities;Orthotic Fit/Training;Patient/family education;Neuromuscular re-education;Therapeutic exercise;Manual techniques;Passive range of motion;Dry needling;Taping    PT Next Visit Plan  Postural strengthening, manual for neck , dry needling    Consulted and Agree with Plan of Care  Patient       Patient will benefit from skilled therapeutic intervention in order to improve the following deficits and impairments:  Decreased activity tolerance, Decreased strength, Pain, Increased muscle spasms, Postural dysfunction, Improper body mechanics, Hypermobility  Visit Diagnosis: Neck pain     Problem List Patient Active Problem List   Diagnosis Date Noted  . Pancytopenia ([place]) 07/02/2017  . Fibroid uterus 06/21/2017  . Chronic urticaria 05/06/2014  . Autoimmune disorder ([place]) 04/08/2012  . Hypothyroidism, unspecified 04/08/2012  . Rhinitis 04/08/2012  . Infertility, female 11/13/2010  . Thrombocytopenia ([place]) 11/13/2010   Lyndee Hensen, PT, DPT 9:57 AM  07/24/17   [facility] [place], [place], [address] Phone: [phone]   Fax:  [phone]  Name: Sarah [name] MRN: [id number] Date of Birth: [birth year]

## 2017-07-31 ENCOUNTER — Encounter: Payer: Self-pay | Admitting: Physical Therapy

## 2017-07-31 ENCOUNTER — Ambulatory Visit (INDEPENDENT_AMBULATORY_CARE_PROVIDER_SITE_OTHER): Payer: 59 | Admitting: Physical Therapy

## 2017-07-31 ENCOUNTER — Encounter: Payer: 59 | Admitting: Physical Therapy

## 2017-07-31 DIAGNOSIS — M542 Cervicalgia: Secondary | ICD-10-CM

## 2017-07-31 NOTE — Therapy (Signed)
[facility] [address] [place], [place], [address] Phone: [phone]   Fax:  [phone]  Physical Therapy Treatment  Patient Details  Name: Sarah [name] MRN: [id number] Date of Birth: 10/04/1970 Referring Provider: Billey Chang   Encounter Date: 07/31/2017  PT End of Session - 07/31/17 0917    Visit Number  5    Number of Visits  16    Date for PT Re-Evaluation  08/29/17    Authorization Type  UMR    PT Start Time  0803    PT Stop Time  0850    PT Time Calculation (min)  47 min    Activity Tolerance  Patient tolerated treatment well    Behavior During Therapy  [facility] for tasks assessed/performed       Past Medical History:  Diagnosis Date  . ANA positive    ?Sjogrens  . Gall stones   . Hypothyroidism    Takes synthroid  . Infertility, female   . Lactose intolerance   . Urticaria   . Vitamin D deficiency disease    Takes Vit D    Past Surgical History:  Procedure Laterality Date  . ANTERIOR CRUCIATE LIGAMENT REPAIR Left ~ 2005  . CHOLECYSTECTOMY N/A 07/13/2014   Procedure: LAPAROSCOPIC CHOLECYSTECTOMY WITH INTRAOPERATIVE CHOLANGIOGRAM;  Surgeon: Rolm Bookbinder, MD;  Location: [facility];  Service: General;  Laterality: N/A;  . ENDOMETRIAL BIOPSY    . FERTILITY SURGERY  ~ 2008; ~ 2011; ~ 2012  . LAPAROSCOPIC CHOLECYSTECTOMY  07/13/2014  . WISDOM TOOTH EXTRACTION      There were no vitals filed for this visit.  Subjective Assessment - 07/31/17 0915    Subjective  Pt states pain is improving, but continues to have soreness daily.     Currently in Pain?  Yes    Pain Score  3     Pain Location  Neck    Pain Orientation  Right;Left    Pain Descriptors / Indicators  Aching;Tightness    Pain Type  Chronic pain    Pain Onset  More than a month ago    Pain Frequency  Intermittent                       [facility] Adult PT Treatment/Exercise - 07/31/17 0816      Posture/Postural Control   Posture Comments   Flattened lumbar region, Slight lumbar shift to L      Exercises   Exercises  Neck      Neck Exercises: Standing   Other Standing Exercises  Rows GTB x20;  Low ROw RTB x20    Other Standing Exercises  --      Neck Exercises: Seated   Neck Retraction  --      Neck Exercises: Supine   Shoulder Flexion  20 reps;Limitations    Shoulder Flexion Weights (lbs)  2    Shoulder Flexion Limitations  with TA    Other Supine Exercise  HOrizontal Abd 2 lb x 20    Other Supine Exercise  Bridging x20; TA with breathing x10;       Manual Therapy   Manual Therapy  Soft tissue mobilization;Passive ROM;Manual Traction;Joint mobilization    Manual therapy comments  skilled palpation and monitoring of soft tissue during DN    Joint Mobilization  Cervical Side glidesand PA mobs     Soft tissue mobilization  DTM to L rhomboid, levator, UT;     Passive ROM  Manual stretching  for Bil UT;     Manual Traction  --      Neck Exercises: Stretches   Upper Trapezius Stretch  2 reps;30 seconds    Corner Stretch  3 reps;30 seconds    Corner Stretch Limitations  45 deg       Trigger Point Dry Needling - 07/31/17 0914    Consent Given?  Yes    Muscles Treated Upper Body  Upper trapezius;Levator scapulae;Rhomboids    Upper Trapezius Response  Twitch reponse elicited;Palpable increased muscle length    Levator Scapulae Response  Palpable increased muscle length    Rhomboids Response  Palpable increased muscle length             PT Short Term Goals - 07/04/17 1024      PT SHORT TERM GOAL #1   Title  Pt to demo independence with initial HEP     Time  3    Period  Weeks    Status  New    Target Date  07/25/17      PT SHORT TERM GOAL #2   Title  Pt to report decreased pain in cervical region, to 3/10         PT Long Term Goals - 07/04/17 1025      PT LONG TERM GOAL #1   Title  Pt to report decreased pain in cervical region, to 0-2/10 with comuputer work, to improve ability for work duties.      Time  8    Period  Weeks    Status  New    Target Date  08/29/17      PT LONG TERM GOAL #2   Title  Pt to demo ability for full cervical ROM, without pain, to improve ability for driving and IADLs.     Time  8    Period  Weeks    Status  New    Target Date  08/29/17      PT LONG TERM GOAL #3   Title  Pt to demo increased strength of shoulder and postural muscles to be at least 4+/5 to improve endurance and pain with work duties .    Time  8    Period  Weeks    Status  New    Target Date  08/29/17      PT LONG TERM GOAL #4   Title  Pt to be indpendent with final HEP for postural and cervical ROM and strength.     Time  8    Period  Weeks    Status  New    Target Date  08/29/17            Plan - 07/31/17 0917    Clinical Impression Statement  Pt with good tolerance of dry needling, with twitch response noted, and increased muscle length, particularly in L UT. She requires cueing for lumbar posture during most exercises, with tendency for anterior pelvic tilt. Pt benefitting from education on core strength, lumbar posture, as well as upper body/neck posture. Pt to benefit from continued strengthening for postural muscles. Recommend continued care.     Rehab Potential  Good    Clinical Impairments Affecting Rehab Potential  Pt may only be able to attend 1x/wk or less due to work schedule     PT Frequency  2x / week    PT Duration  8 weeks    PT Treatment/Interventions  ADLs/Self Care Home Management;Electrical Stimulation;Iontophoresis 4mg /ml Dexamethasone;Moist Heat;Traction;Ultrasound;Functional mobility training;Therapeutic activities;Orthotic Fit/Training;Patient/family  education;Neuromuscular re-education;Therapeutic exercise;Manual techniques;Passive range of motion;Dry needling;Taping    PT Next Visit Plan  Postural strengthening, manual for neck , dry needling    Consulted and Agree with Plan of Care  Patient       Patient will benefit from skilled therapeutic  intervention in order to improve the following deficits and impairments:  Decreased activity tolerance, Decreased strength, Pain, Increased muscle spasms, Postural dysfunction, Improper body mechanics, Hypermobility  Visit Diagnosis: Neck pain     Problem List Patient Active Problem List   Diagnosis Date Noted  . Pancytopenia ([place]) 07/02/2017  . Fibroid uterus 06/21/2017  . Chronic urticaria 05/06/2014  . Autoimmune disorder ([place]) 04/08/2012  . Hypothyroidism, unspecified 04/08/2012  . Rhinitis 04/08/2012  . Infertility, female 11/13/2010  . Thrombocytopenia ([place]) 11/13/2010    Lyndee Hensen, PT, DPT 9:20 AM  07/31/17    [facility] [place], [place], [address] Phone: [phone]   Fax:  [phone]  Name: Sarah [name] MRN: [id number] Date of Birth: 1970-03-29

## 2017-08-06 ENCOUNTER — Encounter: Payer: Self-pay | Admitting: Physical Therapy

## 2017-08-06 ENCOUNTER — Ambulatory Visit (INDEPENDENT_AMBULATORY_CARE_PROVIDER_SITE_OTHER): Payer: 59 | Admitting: Physical Therapy

## 2017-08-06 DIAGNOSIS — M542 Cervicalgia: Secondary | ICD-10-CM

## 2017-08-06 NOTE — Therapy (Signed)
[facility] [address] [place], [place], [address] Phone: [phone]   Fax:  [phone]  Physical Therapy Treatment  Patient Details  Name: Sarah [name] MRN: [id number] Date of Birth: 1970-04-02 Referring Provider: Billey Chang   Encounter Date: 08/06/2017  PT End of Session - 08/06/17 1405    Visit Number  6    Number of Visits  16    Date for PT Re-Evaluation  08/29/17    Authorization Type  UMR    PT Start Time  0802    PT Stop Time  0849    PT Time Calculation (min)  47 min    Activity Tolerance  Patient tolerated treatment well    Behavior During Therapy  [facility] for tasks assessed/performed       Past Medical History:  Diagnosis Date  . ANA positive    ?Sjogrens  . Gall stones   . Hypothyroidism    Takes synthroid  . Infertility, female   . Lactose intolerance   . Urticaria   . Vitamin D deficiency disease    Takes Vit D    Past Surgical History:  Procedure Laterality Date  . ANTERIOR CRUCIATE LIGAMENT REPAIR Left ~ 2005  . CHOLECYSTECTOMY N/A 07/13/2014   Procedure: LAPAROSCOPIC CHOLECYSTECTOMY WITH INTRAOPERATIVE CHOLANGIOGRAM;  Surgeon: Rolm Bookbinder, MD;  Location: [facility];  Service: General;  Laterality: N/A;  . ENDOMETRIAL BIOPSY    . FERTILITY SURGERY  ~ 2008; ~ 2011; ~ 2012  . LAPAROSCOPIC CHOLECYSTECTOMY  07/13/2014  . WISDOM TOOTH EXTRACTION      There were no vitals filed for this visit.  Subjective Assessment - 08/06/17 1405    Subjective  Pt states that L side of neck is improving, R now feels tighter than L.     Currently in Pain?  Yes    Pain Score  3     Pain Location  Neck    Pain Orientation  Right;Left    Pain Descriptors / Indicators  Aching;Tightness    Pain Type  Chronic pain    Pain Onset  More than a month ago    Pain Frequency  Intermittent                       [facility] Adult PT Treatment/Exercise - 08/06/17 0815      Posture/Postural Control   Posture Comments   Flattened lumbar region, Slight lumbar shift to L      Exercises   Exercises  Neck      Neck Exercises: Standing   Other Standing Exercises  Rows GTB x20;  Low ROw RTB x20;  Lumbar side glides to L x20; QL stretch 30 sec x2 on L;     Other Standing Exercises  Standing shoulder Scaption and abduction x10 to 90 deg, x10 full ROM, 2 lb;       Neck Exercises: Supine   Shoulder Flexion  20 reps;Limitations    Shoulder Flexion Weights (lbs)  2    Shoulder Flexion Limitations  with TA    Other Supine Exercise  HOrizontal Abd 2 lb x 20    Other Supine Exercise  Bridging x20;       Manual Therapy   Manual Therapy  Soft tissue mobilization;Passive ROM;Manual Traction;Joint mobilization    Manual therapy comments  skilled palpation and monitoring of soft tissue during DN    Joint Mobilization  --    Soft tissue mobilization  --    Passive  ROM  --      Neck Exercises: Stretches   Upper Trapezius Stretch  2 reps;30 seconds    Engineer, water Limitations  --       Trigger Point Dry Needling - 08/06/17 0853    Consent Given?  Yes    Muscles Treated Upper Body  Upper trapezius;Rhomboids UT on R;  Rhomboid on L;     Upper Trapezius Response  Palpable increased muscle length    Rhomboids Response  Palpable increased muscle length             PT Short Term Goals - 07/04/17 1024      PT SHORT TERM GOAL #1   Title  Pt to demo independence with initial HEP     Time  3    Period  Weeks    Status  New    Target Date  07/25/17      PT SHORT TERM GOAL #2   Title  Pt to report decreased pain in cervical region, to 3/10         PT Long Term Goals - 07/04/17 1025      PT LONG TERM GOAL #1   Title  Pt to report decreased pain in cervical region, to 0-2/10 with comuputer work, to improve ability for work duties.     Time  8    Period  Weeks    Status  New    Target Date  08/29/17      PT LONG TERM GOAL #2   Title  Pt to demo ability for full cervical ROM,  without pain, to improve ability for driving and IADLs.     Time  8    Period  Weeks    Status  New    Target Date  08/29/17      PT LONG TERM GOAL #3   Title  Pt to demo increased strength of shoulder and postural muscles to be at least 4+/5 to improve endurance and pain with work duties .    Time  8    Period  Weeks    Status  New    Target Date  08/29/17      PT LONG TERM GOAL #4   Title  Pt to be indpendent with final HEP for postural and cervical ROM and strength.     Time  8    Period  Weeks    Status  New    Target Date  08/29/17            Plan - 08/06/17 1406    Clinical Impression Statement  Pt with good tolerance of dry needling, increased muscle length noted in R UT region. Pt improving with strength for postural muscles. Reviewed lumbar mobility ther ex today, for improving spine posture, pt with mild scoliosis curve. Pt able to perform ther ex with less cueing for lumbar posture, TA contraction and  shoulder posture. Pt progressing well with ther ex, but continues to report constant "ache" in shoulders and neck with work duties. Plan to progress as tolerated.     Rehab Potential  Good    Clinical Impairments Affecting Rehab Potential  Pt may only be able to attend 1x/wk or less due to work schedule     PT Frequency  2x / week    PT Duration  8 weeks    PT Treatment/Interventions  ADLs/Self Care Home Management;Electrical Stimulation;Iontophoresis 4mg /ml Dexamethasone;Moist Heat;Traction;Ultrasound;Functional mobility training;Therapeutic activities;Orthotic  Fit/Training;Patient/family education;Neuromuscular re-education;Therapeutic exercise;Manual techniques;Passive range of motion;Dry needling;Taping    PT Next Visit Plan  Postural strengthening, manual for neck , dry needling    Consulted and Agree with Plan of Care  Patient       Patient will benefit from skilled therapeutic intervention in order to improve the following deficits and impairments:  Decreased  activity tolerance, Decreased strength, Pain, Increased muscle spasms, Postural dysfunction, Improper body mechanics, Hypermobility  Visit Diagnosis: Neck pain     Problem List Patient Active Problem List   Diagnosis Date Noted  . Pancytopenia ([place]) 07/02/2017  . Fibroid uterus 06/21/2017  . Chronic urticaria 05/06/2014  . Autoimmune disorder ([place]) 04/08/2012  . Hypothyroidism, unspecified 04/08/2012  . Rhinitis 04/08/2012  . Infertility, female 11/13/2010  . Thrombocytopenia ([place]) 11/13/2010    Lyndee Hensen, PT, DPT 2:09 PM  08/06/17    [facility] [place], [place], [address] Phone: [phone]   Fax:  [phone]  Name: Sarah [name] MRN: [id number] Date of Birth: Feb 16, 1970

## 2017-08-14 ENCOUNTER — Ambulatory Visit (INDEPENDENT_AMBULATORY_CARE_PROVIDER_SITE_OTHER): Payer: 59 | Admitting: Physical Therapy

## 2017-08-14 ENCOUNTER — Encounter: Payer: Self-pay | Admitting: Physical Therapy

## 2017-08-14 DIAGNOSIS — M542 Cervicalgia: Secondary | ICD-10-CM | POA: Diagnosis not present

## 2017-08-14 NOTE — Therapy (Signed)
[facility] [address] [place], [place], [address] Phone: [phone]   Fax:  [phone]  Physical Therapy Treatment  Patient Details  Name: Sarah [name] MRN: [id number] Date of Birth: [birth year] Referring Provider: Billey Chang   Encounter Date: 08/14/2017  PT End of Session - 08/14/17 0901    Visit Number  7    Number of Visits  16    Date for PT Re-Evaluation  08/29/17    Authorization Type  UMR    PT Start Time  0801    PT Stop Time  0846    PT Time Calculation (min)  45 min    Activity Tolerance  Patient tolerated treatment well    Behavior During Therapy  [facility] for tasks assessed/performed       Past Medical History:  Diagnosis Date  . ANA positive    ?Sjogrens  . Gall stones   . Hypothyroidism    Takes synthroid  . Infertility, female   . Lactose intolerance   . Urticaria   . Vitamin D deficiency disease    Takes Vit D    Past Surgical History:  Procedure Laterality Date  . ANTERIOR CRUCIATE LIGAMENT REPAIR Left ~ 2005  . CHOLECYSTECTOMY N/A 07/13/2014   Procedure: LAPAROSCOPIC CHOLECYSTECTOMY WITH INTRAOPERATIVE CHOLANGIOGRAM;  Surgeon: Rolm Bookbinder, MD;  Location: [place] [facility];  Service: General;  Laterality: N/A;  . ENDOMETRIAL BIOPSY    . FERTILITY SURGERY  ~ 2008; ~ 2011; ~ 2012  . LAPAROSCOPIC CHOLECYSTECTOMY  07/13/2014  . WISDOM TOOTH EXTRACTION      There were no vitals filed for this visit.  Subjective Assessment - 08/14/17 0900    Subjective  Pt states that she did not have time to do many exercises this week, but her neck still feels good, less pain overall, and less tightness.     Currently in Pain?  Yes    Pain Score  1     Pain Location  Neck    Pain Orientation  Left    Pain Descriptors / Indicators  Tightness    Pain Onset  More than a month ago    Pain Frequency  Intermittent                       [facility] Adult PT Treatment/Exercise - 08/14/17 0001      Posture/Postural  Control   Posture Comments  Flattened lumbar region, Slight lumbar shift to L      Exercises   Exercises  Neck      Neck Exercises: Standing   Other Standing Exercises  Rows GTB x20;  Scap pull outs RTB x20; Wall push ups x20; QL stretch 30 sec x2 bil;     Other Standing Exercises  Standing shoulder Scaption and abduction x20 to 90 deg, 2 lb;       Neck Exercises: Supine   Shoulder Flexion  20 reps;Limitations    Shoulder Flexion Weights (lbs)  2    Shoulder Flexion Limitations  with TA    Other Supine Exercise  HOrizontal Abd 2 lb x 20    Other Supine Exercise  Bridging x20; Quadruped LE lifts x20; TA with marching x20;       Manual Therapy   Manual Therapy  Soft tissue mobilization;Passive ROM;Manual Traction;Joint mobilization    Manual therapy comments  --    Joint Mobilization  Cervical PA mobs     Soft tissue mobilization  DTM to bil UT, cervical  paraspinals, sub occipitals, and rhomboid;     Manual Traction  10 sec x8      Neck Exercises: Stretches   Upper Trapezius Stretch  --             PT Education - 08/14/17 0901    Education Details  Reinforced HEP, PT poc, d/c plan    Person(s) Educated  Patient    Methods  Explanation    Comprehension  Verbalized understanding       PT Short Term Goals - 08/14/17 0902      PT SHORT TERM GOAL #1   Title  Pt to demo independence with initial HEP     Time  3    Period  Weeks    Status  Achieved      PT SHORT TERM GOAL #2   Title  Pt to report decreased pain in cervical region, to 3/10     Status  Achieved        PT Long Term Goals - 07/04/17 1025      PT LONG TERM GOAL #1   Title  Pt to report decreased pain in cervical region, to 0-2/10 with comuputer work, to improve ability for work duties.     Time  8    Period  Weeks    Status  New    Target Date  08/29/17      PT LONG TERM GOAL #2   Title  Pt to demo ability for full cervical ROM, without pain, to improve ability for driving and IADLs.     Time  8     Period  Weeks    Status  New    Target Date  08/29/17      PT LONG TERM GOAL #3   Title  Pt to demo increased strength of shoulder and postural muscles to be at least 4+/5 to improve endurance and pain with work duties .    Time  8    Period  Weeks    Status  New    Target Date  08/29/17      PT LONG TERM GOAL #4   Title  Pt to be indpendent with final HEP for postural and cervical ROM and strength.     Time  8    Period  Weeks    Status  New    Target Date  08/29/17            Plan - 08/14/17 0903    Clinical Impression Statement  Pt making good progress. She has decreased tightness and soreness in Bil UT with manual today. She has improving cervical ROm for all motions. She also has improving ability for ther ex, requiring less cueing for mechanics. Pt will benefit from continuation of core strengthening with UE strengthening to improve posture, stability, and pain. Pt to benefit from 1-2 more sessions to improve UE and scapular strength, and ensure independence with HEP.     Rehab Potential  Good    Clinical Impairments Affecting Rehab Potential  Pt may only be able to attend 1x/wk or less due to work schedule     PT Frequency  2x / week    PT Duration  8 weeks    PT Treatment/Interventions  ADLs/Self Care Home Management;Electrical Stimulation;Iontophoresis 4mg /ml Dexamethasone;Moist Heat;Traction;Ultrasound;Functional mobility training;Therapeutic activities;Orthotic Fit/Training;Patient/family education;Neuromuscular re-education;Therapeutic exercise;Manual techniques;Passive range of motion;Dry needling;Taping    PT Next Visit Plan  Postural strengthening, manual for neck , dry needling    Consulted  and Agree with Plan of Care  Patient       Patient will benefit from skilled therapeutic intervention in order to improve the following deficits and impairments:  Decreased activity tolerance, Decreased strength, Pain, Increased muscle spasms, Postural dysfunction, Improper  body mechanics, Hypermobility  Visit Diagnosis: Neck pain     Problem List Patient Active Problem List   Diagnosis Date Noted  . Pancytopenia ([place]) 07/02/2017  . Fibroid uterus 06/21/2017  . Chronic urticaria 05/06/2014  . Autoimmune disorder ([place]) 04/08/2012  . Hypothyroidism, unspecified 04/08/2012  . Rhinitis 04/08/2012  . Infertility, female 11/13/2010  . Thrombocytopenia ([place]) 11/13/2010   Lyndee Hensen, PT, DPT 9:05 AM  08/14/17    [facility] [place], [place], [address] Phone: [phone]   Fax:  [phone]  Name: Sarah [name] MRN: [id number] Date of Birth: 1970-10-04

## 2017-08-20 ENCOUNTER — Ambulatory Visit (INDEPENDENT_AMBULATORY_CARE_PROVIDER_SITE_OTHER): Payer: 59 | Admitting: Physical Therapy

## 2017-08-20 ENCOUNTER — Encounter: Payer: Self-pay | Admitting: Physical Therapy

## 2017-08-20 DIAGNOSIS — M542 Cervicalgia: Secondary | ICD-10-CM

## 2017-08-20 NOTE — Therapy (Signed)
[facility] [address] [place], [place], [address] Phone: [phone]   Fax:  [phone]  Physical Therapy Treatment/Discharge  Patient Details  Name: Sarah [name] MRN: [id number] Date of Birth: [birth year] Referring Provider: Billey Chang   Encounter Date: 08/20/2017  PT End of Session - 08/20/17 1451    Visit Number  8    Number of Visits  16    Date for PT Re-Evaluation  08/29/17    Authorization Type  UMR    PT Start Time  0802    PT Stop Time  0847    PT Time Calculation (min)  45 min    Activity Tolerance  Patient tolerated treatment well    Behavior During Therapy  [facility] for tasks assessed/performed       Past Medical History:  Diagnosis Date  . ANA positive    ?Sjogrens  . Gall stones   . Hypothyroidism    Takes synthroid  . Infertility, female   . Lactose intolerance   . Urticaria   . Vitamin D deficiency disease    Takes Vit D    Past Surgical History:  Procedure Laterality Date  . ANTERIOR CRUCIATE LIGAMENT REPAIR Left ~ 2005  . CHOLECYSTECTOMY N/A 07/13/2014   Procedure: LAPAROSCOPIC CHOLECYSTECTOMY WITH INTRAOPERATIVE CHOLANGIOGRAM;  Surgeon: Rolm Bookbinder, MD;  Location: [facility];  Service: General;  Laterality: N/A;  . ENDOMETRIAL BIOPSY    . FERTILITY SURGERY  ~ 2008; ~ 2011; ~ 2012  . LAPAROSCOPIC CHOLECYSTECTOMY  07/13/2014  . WISDOM TOOTH EXTRACTION      There were no vitals filed for this visit.  Subjective Assessment - 08/20/17 1450    Subjective  Pt states minimal pain in neck. She feels tightness during the work day. She reports much improvement, but states that she has not been doing HEP as often as recommended.     Currently in Pain?  No/denies         [facility] PT Assessment - 08/20/17 0820      AROM   Overall AROM Comments  Cervical ROM: WNL      Strength   Overall Strength Comments  UE: 4+/5;                    [facility] Adult PT Treatment/Exercise - 08/20/17 0820      Posture/Postural Control   Posture Comments  Flattened lumbar region, Slight lumbar shift to L      Exercises   Exercises  Neck      Neck Exercises: Standing   Other Standing Exercises  Rows GTB x20;  Scap pull outs RTB x20; Wall push ups x20; QL stretch 30 sec x2 bil;     Other Standing Exercises  Standing shoulder Scaption  x20        Neck Exercises: Supine   Shoulder Flexion  --    Shoulder Flexion Weights (lbs)  --    Shoulder Flexion Limitations  --    Other Supine Exercise  HOrizontal Abd 2 lb x 20    Other Supine Exercise  Bridging x20; Quadruped LE lifts x20; TA with marching x20;       Neck Exercises: Prone   Other Prone Exercise  Prone T;  x20 bil;      Manual Therapy   Manual Therapy  Soft tissue mobilization;Passive ROM;Manual Traction;Joint mobilization    Joint Mobilization  Cervical PA mobs     Soft tissue mobilization  DTM to bil UT, cervical paraspinals, sub  occipitals, and rhomboid;     Manual Traction  10 sec x8             PT Education - 08/20/17 1450    Education Details  Final HEP, importance of strengthening.     Person(s) Educated  Patient    Methods  Explanation;Handout;Verbal cues;Tactile cues    Comprehension  Verbalized understanding       PT Short Term Goals - 08/14/17 0902      PT SHORT TERM GOAL #1   Title  Pt to demo independence with initial HEP     Time  3    Period  Weeks    Status  Achieved      PT SHORT TERM GOAL #2   Title  Pt to report decreased pain in cervical region, to 3/10     Status  Achieved        PT Long Term Goals - 08/20/17 1451      PT LONG TERM GOAL #1   Title  Pt to report decreased pain in cervical region, to 0-2/10 with comuputer work, to improve ability for work duties.     Time  8    Period  Weeks    Status  Achieved      PT LONG TERM GOAL #2   Title  Pt to demo ability for full cervical ROM, without pain, to improve ability for driving and IADLs.     Time  8    Period  Weeks    Status   Achieved      PT LONG TERM GOAL #3   Title  Pt to demo increased strength of shoulder and postural muscles to be at least 4+/5 to improve endurance and pain with work duties .    Time  8    Period  Weeks    Status  Achieved      PT LONG TERM GOAL #4   Title  Pt to be indpendent with final HEP for postural and cervical ROM and strength.     Time  8    Period  Weeks    Status  Achieved            Plan - 08/20/17 1452    Clinical Impression Statement  Pt has made good progress. She has cervical ROM much improved, now WNL. She has improved postural awareness with ther ex and work duties, as well as increased strength of UEs. Pt will benefit from continued practice with strengtening for UE and postural muscles. Pt educated on final HEP today, ready for d/c to HEP, pt in agreement with plan.     Rehab Potential  Good    Clinical Impairments Affecting Rehab Potential  Pt may only be able to attend 1x/wk or less due to work schedule     PT Frequency  2x / week    PT Duration  8 weeks    PT Treatment/Interventions  ADLs/Self Care Home Management;Electrical Stimulation;Iontophoresis '4mg'$ /ml Dexamethasone;Moist Heat;Traction;Ultrasound;Functional mobility training;Therapeutic activities;Orthotic Fit/Training;Patient/family education;Neuromuscular re-education;Therapeutic exercise;Manual techniques;Passive range of motion;Dry needling;Taping    PT Next Visit Plan  Postural strengthening, manual for neck , dry needling    Consulted and Agree with Plan of Care  Patient       Patient will benefit from skilled therapeutic intervention in order to improve the following deficits and impairments:  Decreased activity tolerance, Decreased strength, Pain, Increased muscle spasms, Postural dysfunction, Improper body mechanics, Hypermobility  Visit Diagnosis: Neck pain  Problem List Patient Active Problem List   Diagnosis Date Noted  . Pancytopenia ([place]) 07/02/2017  . Fibroid uterus 06/21/2017   . Chronic urticaria 05/06/2014  . Autoimmune disorder ([place]) 04/08/2012  . Hypothyroidism, unspecified 04/08/2012  . Rhinitis 04/08/2012  . Infertility, female 11/13/2010  . Thrombocytopenia ([place]) 11/13/2010   Lyndee Hensen, PT, DPT 2:56 PM  08/20/17    [facility] [place], [place], [address] Phone: [phone]   Fax:  [phone]  Name: Sarah [name] MRN: [id number] Date of Birth: July 14, 1970  PHYSICAL THERAPY DISCHARGE SUMMARY  Visits from Start of Care   :8 Plan: Patient agrees to discharge.  Patient goals were met. Patient is being discharged due to meeting the stated rehab goals.  ?????     Lyndee Hensen, PT, DPT 2:57 PM  08/20/17

## 2017-08-20 NOTE — Patient Instructions (Signed)
Access Code: [email or web address]  URL: [email or web address]  Date: 08/20/2017  Prepared by: Lyndee Hensen   Exercises  Seated Cervical Retraction - 10 reps - 2 sets - 2x daily  Seated Cervical Rotation AROM - 10 reps - 2 sets - 2x daily  Seated Shoulder Rolls - 10 reps - 2 sets - 2x daily  Standing Scapular Retraction - 10 reps - 2 sets - 2x daily  Seated Cervical Sidebending Stretch - 3 sets - 30 hold - 2x daily  Doorway Pec Stretch at 60 Degrees Abduction - 3 sets - 30 hold - 2x daily  Supine Chest Stretch on Foam Roll - 10 reps - 2 sets - 2x daily  Standing Row with Resistance - 10 reps - 2 sets - 2x daily  Standing Shoulder Scaption - 10 reps - 2 sets - 2x daily  Supine Shoulder Horizontal Abduction with Dumbbells - 10 reps - 2 sets - 2x daily  Prone Scapular Retraction Arms at Side - 10 reps - 2 sets - 2x daily  Supine Posterior Pelvic Tilt - 10 reps - 2 sets - 2x daily  Supine Bridge - 10 reps - 2 sets - 2x daily  Bird Dog - 10 reps - 2 sets - 2x daily  Supine March - 10 reps - 2 sets - 2x daily

## 2017-08-28 ENCOUNTER — Encounter: Payer: 59 | Admitting: Physical Therapy

## 2017-09-11 DIAGNOSIS — D72819 Decreased white blood cell count, unspecified: Secondary | ICD-10-CM | POA: Diagnosis not present

## 2017-09-11 DIAGNOSIS — D696 Thrombocytopenia, unspecified: Secondary | ICD-10-CM | POA: Diagnosis not present

## 2017-09-11 DIAGNOSIS — R768 Other specified abnormal immunological findings in serum: Secondary | ICD-10-CM | POA: Diagnosis not present

## 2017-09-11 DIAGNOSIS — M3500 Sicca syndrome, unspecified: Secondary | ICD-10-CM | POA: Diagnosis not present

## 2017-09-11 DIAGNOSIS — Z681 Body mass index (BMI) 19 or less, adult: Secondary | ICD-10-CM | POA: Diagnosis not present

## 2017-09-11 DIAGNOSIS — R5383 Other fatigue: Secondary | ICD-10-CM | POA: Diagnosis not present

## 2018-01-29 DIAGNOSIS — H169 Unspecified keratitis: Secondary | ICD-10-CM

## 2018-01-29 HISTORY — DX: Unspecified keratitis: H16.9

## 2018-03-18 ENCOUNTER — Other Ambulatory Visit: Payer: Self-pay | Admitting: Family Medicine

## 2018-03-18 MED ORDER — DESONIDE 0.05 % EX CREA: TOPICAL_CREAM | Freq: Two times a day (BID) | CUTANEOUS | 0 refills | Status: DC

## 2018-03-18 MED FILL — DESONIDE 0.05% CREAM: 0.05 | 7 days supply | Qty: 30 | Fill #0

## 2018-05-22 ENCOUNTER — Other Ambulatory Visit: Payer: Self-pay | Admitting: Family Medicine

## 2018-05-23 ENCOUNTER — Other Ambulatory Visit: Payer: Self-pay | Admitting: Family Medicine

## 2018-05-23 ENCOUNTER — Encounter: Payer: Self-pay | Admitting: Family Medicine

## 2018-05-23 MED ORDER — DESONIDE 0.05 % EX CREA: TOPICAL_CREAM | Freq: Two times a day (BID) | CUTANEOUS | 0 refills | Status: DC

## 2018-05-23 MED FILL — DESONIDE 0.05% CREAM: 0.05 | 15 days supply | Qty: 30 | Fill #0

## 2018-06-24 ENCOUNTER — Encounter: Payer: 59 | Admitting: Family Medicine

## 2018-07-02 ENCOUNTER — Encounter: Payer: 59 | Admitting: Family Medicine

## 2018-07-17 ENCOUNTER — Ambulatory Visit (INDEPENDENT_AMBULATORY_CARE_PROVIDER_SITE_OTHER): Payer: 59 | Admitting: Family Medicine

## 2018-07-17 ENCOUNTER — Encounter: Payer: Self-pay | Admitting: Family Medicine

## 2018-07-17 ENCOUNTER — Other Ambulatory Visit: Payer: Self-pay

## 2018-07-17 VITALS — BP 108/68 | HR 74 | Temp 99.1°F | Resp 14 | Ht 63.0 in | Wt 110.8 lb

## 2018-07-17 DIAGNOSIS — D61818 Other pancytopenia: Secondary | ICD-10-CM | POA: Diagnosis not present

## 2018-07-17 DIAGNOSIS — Z8639 Personal history of other endocrine, nutritional and metabolic disease: Secondary | ICD-10-CM | POA: Diagnosis not present

## 2018-07-17 DIAGNOSIS — D8989 Other specified disorders involving the immune mechanism, not elsewhere classified: Secondary | ICD-10-CM

## 2018-07-17 DIAGNOSIS — Z0000 Encounter for general adult medical examination without abnormal findings: Secondary | ICD-10-CM | POA: Diagnosis not present

## 2018-07-17 DIAGNOSIS — E559 Vitamin D deficiency, unspecified: Secondary | ICD-10-CM | POA: Diagnosis not present

## 2018-07-17 DIAGNOSIS — Z23 Encounter for immunization: Secondary | ICD-10-CM

## 2018-07-17 DIAGNOSIS — L508 Other urticaria: Secondary | ICD-10-CM

## 2018-07-17 LAB — COMPREHENSIVE METABOLIC PANEL
ALT: 13 U/L (ref 0–35)
AST: 19 U/L (ref 0–37)
Albumin: 4.6 g/dL (ref 3.5–5.2)
Alkaline Phosphatase: 63 U/L (ref 39–117)
BUN: 20 mg/dL (ref 6–23)
CO2: 29 mEq/L (ref 19–32)
Calcium: 9.5 mg/dL (ref 8.4–10.5)
Chloride: 101 mEq/L (ref 96–112)
Creatinine, Ser: 0.57 mg/dL (ref 0.40–1.20)
GFR: 113.03 mL/min (ref >60.00)
Glucose, Bld: 94 mg/dL (ref 70–99)
Potassium: 3.5 mEq/L (ref 3.5–5.1)
Sodium: 138 mEq/L (ref 135–145)
Total Bilirubin: 0.4 mg/dL (ref 0.2–1.2)
Total Protein: 7.5 g/dL (ref 6.0–8.3)

## 2018-07-17 LAB — CBC WITH DIFFERENTIAL/PLATELET
Basophils Absolute: 0 10*3/uL (ref 0.0–0.1)
Basophils Relative: 0.8 % (ref 0.0–3.0)
Eosinophils Absolute: 0.1 10*3/uL (ref 0.0–0.7)
Eosinophils Relative: 2.6 % (ref 0.0–5.0)
HCT: 37 % (ref 36.0–46.0)
Hemoglobin: 12.4 g/dL (ref 12.0–15.0)
Lymphocytes Relative: 26.1 % (ref 12.0–46.0)
Lymphs Abs: 1 10*3/uL (ref 0.7–4.0)
MCHC: 33.6 g/dL (ref 30.0–36.0)
MCV: 93.4 fl (ref 78.0–100.0)
Monocytes Absolute: 0.3 10*3/uL (ref 0.1–1.0)
Monocytes Relative: 9.2 % (ref 3.0–12.0)
Neutro Abs: 2.3 10*3/uL (ref 1.4–7.7)
Neutrophils Relative %: 61.3 % (ref 43.0–77.0)
Platelets: 101 10*3/uL — ABNORMAL LOW (ref 150.0–400.0)
RBC: 3.96 Mil/uL (ref 3.87–5.11)
RDW: 12.8 % (ref 11.5–15.5)
WBC: 3.7 10*3/uL — ABNORMAL LOW (ref 4.0–10.5)

## 2018-07-17 LAB — TSH: TSH: 1.49 u[IU]/mL (ref 0.35–4.50)

## 2018-07-17 LAB — LIPID PANEL
Cholesterol: 191 mg/dL (ref 0–200)
HDL: 53.7 mg/dL (ref >39.00)
LDL Cholesterol: 104 mg/dL — ABNORMAL HIGH (ref 0–99)
NonHDL: 137.53
Total CHOL/HDL Ratio: 4
Triglycerides: 167 mg/dL — ABNORMAL HIGH (ref 0.0–149.0)
VLDL: 33.4 mg/dL (ref 0.0–40.0)

## 2018-07-17 LAB — VITAMIN D 25 HYDROXY (VIT D DEFICIENCY, FRACTURES): VITD: 40.06 ng/mL (ref 30.00–100.00)

## 2018-07-17 NOTE — Progress Notes (Signed)
Subjective  Chief Complaint  Patient presents with  . Annual Exam    Non fasting, Tetanus vaccine    HPI: Sarah [name] is a 48 y.o. female who presents to [facility] [place] today for a Female Wellness Visit. She also has the concerns and/or needs as listed above in the chief complaint. These will be addressed in addition to the Health Maintenance Visit.   Wellness Visit: annual visit with health maintenance review and exam without Pap   HM: Cervical cancer screening is up-to-date.  Defers mammogram till age 77.  Healthy lifestyle.  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  Coping well. Chronic disease f/u and/or acute problem visit: (deemed necessary to be done in addition to the wellness visit):  Autoimmune disorder: She did see rheumatology last year.  They are monitoring her lab work.  Possibly Sjogren's disorder.  No current medications.  Rash: Broke out with erythematous rash diffusely back in March.  Itching.  Uses topical steroids with some benefit but still breaking out.  Rash appears different than her chronic urticaria.  She has seen allergy in the past related to that.  She is used steroids which have been helpful in the past for that.  Question cutaneous lupus or other autoimmune related rash.  It is improving  Pancytopenia work-up by hematology in the past and thought to be related autoimmune dysfunction.  Due for recheck.  Denies easy bruising, petechiae, purpura or easy bleeding.  History of hypothyroidism: Clinically feels well.  Energy level is good.  Has not been on supplements for several years.  Assessment  1. Annual physical exam   2. Pancytopenia ([place])   3. Autoimmune disorder ([place])   4. History of hypothyroidism   5. Need for Tdap vaccination   6. Vitamin D deficiency   7. Chronic urticaria      Plan  Female Wellness Visit:  Age appropriate Health Maintenance and Prevention measures were discussed with patient.  Included topics are cancer screening recommendations, ways to keep healthy (see AVS) including dietary and exercise recommendations, regular eye and dental care, use of seat belts, and avoidance of moderate alcohol use and tobacco use.   BMI: discussed patient's BMI and encouraged positive lifestyle modifications to help get to or maintain a target BMI.  HM needs and immunizations were addressed and ordered. See below for orders. See HM and immunization section for updates.  Tdap today  Routine labs and screening tests ordered including cmp, cbc and lipids where appropriate.  Discussed recommendations regarding Vit D and calcium supplementation (see AVS)  Chronic disease management visit and/or acute problem visit:  Autoimmune disorder, possible Sjogren's: Has follow-up with Dr. Lenna Gilford in November.  panCytopenia: Recheck today.  Rash: Unclear etiology: Referred to dermatology  Recheck thyroid function Follow up: Return in about 1 year (around 07/17/2019) for complete physical.  Orders Placed This Encounter  Procedures  . Tdap vaccine greater than or equal to 7yo IM  . CBC with Differential/Platelet  . Comprehensive metabolic panel  . Lipid panel  . TSH  . VITAMIN D 25 Hydroxy (Vit-D Deficiency, Fractures)  . Ambulatory referral to Dermatology   No orders of the defined types were placed in this encounter.     Lifestyle: Body mass index is 19.63 kg/m. Wt Readings from Last 3 Encounters:  07/17/18 110 lb 12.8 oz (50.3 kg)  06/21/17 110 lb (49.9 kg)  09/12/16 111 lb 3.2 oz (50.4 kg)   Diet: low fat Exercise:  frequently,   Patient Active Problem List   Diagnosis Date Noted  . Vitamin D deficiency 07/17/2018  . Pancytopenia ([place]) 07/02/2017  . Fibroid uterus 06/21/2017    Asymptomatic; found during infertility eval   . Chronic urticaria 05/06/2014  . Autoimmune disorder ([place]) 04/08/2012    ? Sjogren's or other- referred to rheum 2019   . Hypothyroidism 04/08/2012  .  Rhinitis 04/08/2012  . Infertility, female 11/13/2010  . Thrombocytopenia ([place]) 11/13/2010    Overview:  Evaluated by heme onc    Health Maintenance  Topic Date Due  . TETANUS/TDAP  02/28/1989  . INFLUENZA VACCINE  08/30/2018  . PAP SMEAR-Modifier  06/22/2022  . HIV Screening  Completed   Immunization History  Administered Date(s) Administered  . Influenza-Unspecified 11/13/2010, 12/14/2011  . Tdap 07/17/2018   We updated and reviewed the patient's past history in detail and it is documented below. Allergies: Patient is allergic to lactose intolerance (gi) and tape. Past Medical History Patient  has a past medical history of ANA positive, Gall stones, Hypothyroidism, Infertility, female, Lactose intolerance, Urticaria, and Vitamin D deficiency disease. Past Surgical History Patient  has a past surgical history that includes Wisdom tooth extraction; Endometrial biopsy; Fertility Surgery (~ 2008; ~ 2011; ~ 2012); Laparoscopic cholecystectomy (07/13/2014); Cholecystectomy (N/A, 07/13/2014); and Anterior cruciate ligament repair (Left, ~ 2005). Family History: Patient family history includes Lupus in her sister. Social History:  Patient  reports that she has never smoked. She has never used smokeless tobacco. She reports that she does not drink alcohol or use drugs.  Review of Systems: Constitutional: negative for fever or malaise Ophthalmic: negative for photophobia, double vision or loss of vision Cardiovascular: negative for chest pain, dyspnea on exertion, or new LE swelling Respiratory: negative for SOB or persistent cough Gastrointestinal: negative for abdominal pain, change in bowel habits or melena Genitourinary: negative for dysuria or gross hematuria, no abnormal uterine bleeding or disharge Musculoskeletal: negative for new gait disturbance or muscular weakness Integumentary: negative for new or persistent rashes, no breast lumps Neurological: negative for TIA or stroke  symptoms Psychiatric: negative for SI or delusions Allergic/Immunologic: negative for hives  Patient Care Team    Relationship Specialty Notifications Start End  Leamon Arnt, MD PCP - General Family Medicine  06/21/17   Jiles Prows, MD Consulting Physician Allergy and Immunology  06/21/17     Objective  Vitals: BP 108/68   Pulse 74   Temp 99.1 F (37.3 C) (Oral)   Resp 14   Ht 5\' 3"  (1.6 m)   Wt 110 lb 12.8 oz (50.3 kg)   LMP 07/03/2018   SpO2 97%   BMI 19.63 kg/m  General:  Well developed, well nourished, no acute distress  Psych:  Alert and orientedx3,normal mood and affect HEENT:  Normocephalic, atraumatic, non-icteric sclera, PERRL, oropharynx is clear without mass or exudate, supple neck without adenopathy, mass or thyromegaly Cardiovascular:  Normal S1, S2, RRR without gallop, rub or murmur, nondisplaced PMI Respiratory:  Good breath sounds bilaterally, CTAB with normal respiratory effort Gastrointestinal: normal bowel sounds, soft, non-tender, no noted masses. No HSM MSK: no deformities, contusions. Joints are without erythema or swelling. Spine and CVA region are nontender Skin:  Warm, diffuse some scarring chest and back.  No mucosal involvement. Neurologic:    Mental status is normal. CN 2-11 are normal. Gross motor and sensory exams are normal. Normal gait. No tremor Breast Exam: No mass, skin retraction or nipple discharge is appreciated in either breast. No  axillary adenopathy. Fibrocystic changes are not noted   Commons side effects, risks, benefits, and alternatives for medications and treatment plan prescribed today were discussed, and the patient expressed understanding of the given instructions. Patient is instructed to call or message via MyChart if he/she has any questions or concerns regarding our treatment plan. No barriers to understanding were identified. We discussed Red Flag symptoms and signs in detail. Patient expressed understanding regarding what  to do in case of urgent or emergency type symptoms.   Medication list was reconciled, printed and provided to the patient in AVS. Patient instructions and summary information was reviewed with the patient as documented in the AVS. This note was prepared with assistance of Dragon voice recognition software. Occasional wrong-word or sound-a-like substitutions may have occurred due to the inherent limitations of voice recognition software

## 2018-07-17 NOTE — Patient Instructions (Addendum)
Please return in 12 months for your annual complete physical; please come fasting.  I will release your lab results to you on your MyChart account with further instructions. Please reply with any questions.   Today you were given your Tdap vaccination.   If you have any questions or concerns, please don't hesitate to send me a message via MyChart or call the office at [phone]. Thank you for visiting with [place] today! It's our pleasure caring for you.   Preventive Care 40-64 Years, Female Preventive care refers to lifestyle choices and visits with your health care provider that can promote health and wellness. What does preventive care include?   A yearly physical exam. This is also called an annual well check.  Dental exams once or twice a year.  Routine eye exams. Ask your health care provider how often you should have your eyes checked.  Personal lifestyle choices, including: ? Daily care of your teeth and gums. ? Regular physical activity. ? Eating a healthy diet. ? Avoiding tobacco and drug use. ? Limiting alcohol use. ? Practicing safe sex. ? Taking low-dose aspirin daily starting at age 23. ? Taking vitamin and mineral supplements as recommended by your health care provider. What happens during an annual well check? The services and screenings done by your health care provider during your annual well check will depend on your age, overall health, lifestyle risk factors, and family history of disease. Counseling Your health care provider may ask you questions about your:  Alcohol use.  Tobacco use.  Drug use.  Emotional well-being.  Home and relationship well-being.  Sexual activity.  Eating habits.  Work and work Statistician.  Method of birth control.  Menstrual cycle.  Pregnancy history. Screening You may have the following tests or measurements:  Height, weight, and BMI.  Blood pressure.  Lipid and cholesterol levels. These may be checked every  5 years, or more frequently if you are over 51 years old.  Skin check.  Lung cancer screening. You may have this screening every year starting at age 28 if you have a 30-pack-year history of smoking and currently smoke or have quit within the past 15 years.  Colorectal cancer screening. All adults should have this screening starting at age 66 and continuing until age 37. Your health care provider may recommend screening at age 73. You will have tests every 1-10 years, depending on your results and the type of screening test. People at increased risk should start screening at an earlier age. Screening tests may include: ? Guaiac-based fecal occult blood testing. ? Fecal immunochemical test (FIT). ? Stool DNA test. ? Virtual colonoscopy. ? Sigmoidoscopy. During this test, a flexible tube with a tiny camera (sigmoidoscope) is used to examine your rectum and lower colon. The sigmoidoscope is inserted through your anus into your rectum and lower colon. ? Colonoscopy. During this test, a long, thin, flexible tube with a tiny camera (colonoscope) is used to examine your entire colon and rectum.  Hepatitis C blood test.  Hepatitis B blood test.  Sexually transmitted disease (STD) testing.  Diabetes screening. This is done by checking your blood sugar (glucose) after you have not eaten for a while (fasting). You may have this done every 1-3 years.  Mammogram. This may be done every 1-2 years. Talk to your health care provider about when you should start having regular mammograms. This may depend on whether you have a family history of breast cancer.  BRCA-related cancer screening. This may be done  if you have a family history of breast, ovarian, tubal, or peritoneal cancers.  Pelvic exam and Pap test. This may be done every 3 years starting at age 68. Starting at age 77, this may be done every 5 years if you have a Pap test in combination with an HPV test.  Bone density scan. This is done to  screen for osteoporosis. You may have this scan if you are at high risk for osteoporosis. Discuss your test results, treatment options, and if necessary, the need for more tests with your health care provider. Vaccines Your health care provider may recommend certain vaccines, such as:  Influenza vaccine. This is recommended every year.  Tetanus, diphtheria, and acellular pertussis (Tdap, Td) vaccine. You may need a Td booster every 10 years.  Varicella vaccine. You may need this if you have not been vaccinated.  Zoster vaccine. You may need this after age 69.  Measles, mumps, and rubella (MMR) vaccine. You may need at least one dose of MMR if you were born in 1957 or later. You may also need a second dose.  Pneumococcal 13-valent conjugate (PCV13) vaccine. You may need this if you have certain conditions and were not previously vaccinated.  Pneumococcal polysaccharide (PPSV23) vaccine. You may need one or two doses if you smoke cigarettes or if you have certain conditions.  Meningococcal vaccine. You may need this if you have certain conditions.  Hepatitis A vaccine. You may need this if you have certain conditions or if you travel or work in places where you may be exposed to hepatitis A.  Hepatitis B vaccine. You may need this if you have certain conditions or if you travel or work in places where you may be exposed to hepatitis B.  Haemophilus influenzae type b (Hib) vaccine. You may need this if you have certain conditions. Talk to your health care provider about which screenings and vaccines you need and how often you need them. This information is not intended to replace advice given to you by your health care provider. Make sure you discuss any questions you have with your health care provider. Document Released: 02/11/2015 Document Revised: 03/07/2017 Document Reviewed: 11/16/2014 Elsevier Interactive Patient Education  2019 [employer].

## 2018-08-11 DIAGNOSIS — R21 Rash and other nonspecific skin eruption: Secondary | ICD-10-CM | POA: Diagnosis not present

## 2018-08-11 MED FILL — TRIAMCINOLONE 0.1% CREAM: 0.1 | 30 days supply | Qty: 454 | Fill #0

## 2018-10-10 DIAGNOSIS — H1045 Other chronic allergic conjunctivitis: Secondary | ICD-10-CM | POA: Diagnosis not present

## 2018-10-10 DIAGNOSIS — H04123 Dry eye syndrome of bilateral lacrimal glands: Secondary | ICD-10-CM | POA: Diagnosis not present

## 2018-10-10 MED FILL — NEO/POLY/DEXAMET EYE OINT: 3.5-10000-0 | 10 days supply | Qty: 4 | Fill #0

## 2018-10-10 MED FILL — TOBRAMYCIN-DEXAMETH OPTH SU: 0.3-0.1 | 13 days supply | Qty: 5 | Fill #0

## 2018-10-14 DIAGNOSIS — H1045 Other chronic allergic conjunctivitis: Secondary | ICD-10-CM | POA: Diagnosis not present

## 2018-10-14 DIAGNOSIS — H04123 Dry eye syndrome of bilateral lacrimal glands: Secondary | ICD-10-CM | POA: Diagnosis not present

## 2018-10-14 MED FILL — FLUOROMETHOLONE 0.1% DROPS: 0.1 | 25 days supply | Qty: 10 | Fill #0

## 2018-10-22 DIAGNOSIS — H524 Presbyopia: Secondary | ICD-10-CM | POA: Diagnosis not present

## 2018-10-22 DIAGNOSIS — H16123 Filamentary keratitis, bilateral: Secondary | ICD-10-CM | POA: Diagnosis not present

## 2018-10-22 DIAGNOSIS — H04123 Dry eye syndrome of bilateral lacrimal glands: Secondary | ICD-10-CM | POA: Diagnosis not present

## 2018-10-22 MED FILL — DUREZOL 0.05% EYE DROPS: 0.05 | 4 days supply | Qty: 5 | Fill #0

## 2018-10-22 MED FILL — MOXIFLOXACIN HCL 0.5 % SOLN: 0.5 | 15 days supply | Qty: 3 | Fill #0

## 2018-10-24 DIAGNOSIS — H04123 Dry eye syndrome of bilateral lacrimal glands: Secondary | ICD-10-CM | POA: Diagnosis not present

## 2018-10-24 DIAGNOSIS — H16123 Filamentary keratitis, bilateral: Secondary | ICD-10-CM | POA: Diagnosis not present

## 2018-10-24 MED FILL — NEO/POLY/DEXAMET EYE OINT: 3.5-10000-0 | 30 days supply | Qty: 4 | Fill #0

## 2018-10-27 DIAGNOSIS — H16121 Filamentary keratitis, right eye: Secondary | ICD-10-CM | POA: Diagnosis not present

## 2018-10-27 DIAGNOSIS — H04123 Dry eye syndrome of bilateral lacrimal glands: Secondary | ICD-10-CM | POA: Diagnosis not present

## 2018-10-28 DIAGNOSIS — H16121 Filamentary keratitis, right eye: Secondary | ICD-10-CM | POA: Diagnosis not present

## 2018-10-28 DIAGNOSIS — H04123 Dry eye syndrome of bilateral lacrimal glands: Secondary | ICD-10-CM | POA: Diagnosis not present

## 2018-10-31 DIAGNOSIS — H16223 Keratoconjunctivitis sicca, not specified as Sjogren's, bilateral: Secondary | ICD-10-CM | POA: Diagnosis not present

## 2018-10-31 DIAGNOSIS — H04123 Dry eye syndrome of bilateral lacrimal glands: Secondary | ICD-10-CM | POA: Diagnosis not present

## 2018-10-31 DIAGNOSIS — H16123 Filamentary keratitis, bilateral: Secondary | ICD-10-CM | POA: Diagnosis not present

## 2018-11-06 DIAGNOSIS — H16223 Keratoconjunctivitis sicca, not specified as Sjogren's, bilateral: Secondary | ICD-10-CM | POA: Diagnosis not present

## 2018-11-06 DIAGNOSIS — H16123 Filamentary keratitis, bilateral: Secondary | ICD-10-CM | POA: Diagnosis not present

## 2018-11-06 DIAGNOSIS — H04123 Dry eye syndrome of bilateral lacrimal glands: Secondary | ICD-10-CM | POA: Diagnosis not present

## 2018-11-06 MED FILL — DOXYCYCLINE HYCLATE 100 MG: 100 | 45 days supply | Qty: 90 | Fill #0

## 2018-11-06 MED FILL — MOXIFLOXACIN HCL 0.5 % SOLN: 0.5 | 15 days supply | Qty: 3 | Fill #0

## 2018-11-11 DIAGNOSIS — H16223 Keratoconjunctivitis sicca, not specified as Sjogren's, bilateral: Secondary | ICD-10-CM | POA: Diagnosis not present

## 2018-11-11 DIAGNOSIS — H04123 Dry eye syndrome of bilateral lacrimal glands: Secondary | ICD-10-CM | POA: Diagnosis not present

## 2018-11-11 DIAGNOSIS — H16123 Filamentary keratitis, bilateral: Secondary | ICD-10-CM | POA: Diagnosis not present

## 2018-11-11 MED FILL — DUREZOL 0.05% EYE DROPS: 0.05 | 17 days supply | Qty: 5 | Fill #0

## 2018-11-17 DIAGNOSIS — H04123 Dry eye syndrome of bilateral lacrimal glands: Secondary | ICD-10-CM | POA: Diagnosis not present

## 2018-11-17 DIAGNOSIS — H16222 Keratoconjunctivitis sicca, not specified as Sjogren's, left eye: Secondary | ICD-10-CM | POA: Diagnosis not present

## 2018-11-17 DIAGNOSIS — H04122 Dry eye syndrome of left lacrimal gland: Secondary | ICD-10-CM | POA: Diagnosis not present

## 2018-11-17 DIAGNOSIS — H16123 Filamentary keratitis, bilateral: Secondary | ICD-10-CM | POA: Diagnosis not present

## 2018-11-17 DIAGNOSIS — H16223 Keratoconjunctivitis sicca, not specified as Sjogren's, bilateral: Secondary | ICD-10-CM | POA: Diagnosis not present

## 2018-11-18 DIAGNOSIS — H04123 Dry eye syndrome of bilateral lacrimal glands: Secondary | ICD-10-CM | POA: Diagnosis not present

## 2018-11-18 DIAGNOSIS — H16123 Filamentary keratitis, bilateral: Secondary | ICD-10-CM | POA: Diagnosis not present

## 2018-11-18 DIAGNOSIS — H16223 Keratoconjunctivitis sicca, not specified as Sjogren's, bilateral: Secondary | ICD-10-CM | POA: Diagnosis not present

## 2018-11-18 MED FILL — predniSONE 20 MG TABS: 20 | 21 days supply | Qty: 21 | Fill #0

## 2018-11-19 DIAGNOSIS — H16123 Filamentary keratitis, bilateral: Secondary | ICD-10-CM | POA: Diagnosis not present

## 2018-11-19 DIAGNOSIS — H16223 Keratoconjunctivitis sicca, not specified as Sjogren's, bilateral: Secondary | ICD-10-CM | POA: Diagnosis not present

## 2018-11-19 DIAGNOSIS — H04123 Dry eye syndrome of bilateral lacrimal glands: Secondary | ICD-10-CM | POA: Diagnosis not present

## 2018-11-24 DIAGNOSIS — R21 Rash and other nonspecific skin eruption: Secondary | ICD-10-CM | POA: Diagnosis not present

## 2018-11-24 DIAGNOSIS — R768 Other specified abnormal immunological findings in serum: Secondary | ICD-10-CM | POA: Diagnosis not present

## 2018-11-24 DIAGNOSIS — D72819 Decreased white blood cell count, unspecified: Secondary | ICD-10-CM | POA: Diagnosis not present

## 2018-11-24 DIAGNOSIS — M3500 Sicca syndrome, unspecified: Secondary | ICD-10-CM | POA: Diagnosis not present

## 2018-11-24 DIAGNOSIS — D696 Thrombocytopenia, unspecified: Secondary | ICD-10-CM | POA: Diagnosis not present

## 2018-11-24 DIAGNOSIS — H16123 Filamentary keratitis, bilateral: Secondary | ICD-10-CM | POA: Diagnosis not present

## 2018-11-24 DIAGNOSIS — H16223 Keratoconjunctivitis sicca, not specified as Sjogren's, bilateral: Secondary | ICD-10-CM | POA: Diagnosis not present

## 2018-11-24 DIAGNOSIS — H04123 Dry eye syndrome of bilateral lacrimal glands: Secondary | ICD-10-CM | POA: Diagnosis not present

## 2018-11-24 DIAGNOSIS — H169 Unspecified keratitis: Secondary | ICD-10-CM | POA: Diagnosis not present

## 2018-11-24 MED FILL — MOXIFLOXACIN HCL 0.5 % SOLN: 0.5 | 15 days supply | Qty: 3 | Fill #0

## 2018-11-28 DIAGNOSIS — Z8669 Personal history of other diseases of the nervous system and sense organs: Secondary | ICD-10-CM | POA: Diagnosis not present

## 2018-11-28 DIAGNOSIS — H04123 Dry eye syndrome of bilateral lacrimal glands: Secondary | ICD-10-CM | POA: Diagnosis not present

## 2018-11-28 MED FILL — LOTEPREDNOL ETABONATE 0.5 %: 0.5 | 25 days supply | Qty: 5 | Fill #0

## 2018-12-01 DIAGNOSIS — H04123 Dry eye syndrome of bilateral lacrimal glands: Secondary | ICD-10-CM | POA: Diagnosis not present

## 2018-12-01 DIAGNOSIS — H16223 Keratoconjunctivitis sicca, not specified as Sjogren's, bilateral: Secondary | ICD-10-CM | POA: Diagnosis not present

## 2018-12-01 DIAGNOSIS — H16123 Filamentary keratitis, bilateral: Secondary | ICD-10-CM | POA: Diagnosis not present

## 2018-12-08 ENCOUNTER — Other Ambulatory Visit: Payer: Self-pay

## 2018-12-08 DIAGNOSIS — Z20822 Contact with and (suspected) exposure to covid-19: Secondary | ICD-10-CM

## 2018-12-09 LAB — NOVEL CORONAVIRUS, NAA: SARS-CoV-2, NAA: NOT DETECTED

## 2018-12-11 DIAGNOSIS — H16123 Filamentary keratitis, bilateral: Secondary | ICD-10-CM | POA: Diagnosis not present

## 2018-12-11 DIAGNOSIS — H16223 Keratoconjunctivitis sicca, not specified as Sjogren's, bilateral: Secondary | ICD-10-CM | POA: Diagnosis not present

## 2018-12-11 DIAGNOSIS — H04123 Dry eye syndrome of bilateral lacrimal glands: Secondary | ICD-10-CM | POA: Diagnosis not present

## 2018-12-11 MED FILL — predniSONE 10 MG TABS: 10 | 40 days supply | Qty: 40 | Fill #0

## 2018-12-11 MED FILL — MOXIFLOXACIN HCL 0.5 % SOLN: 0.5 | 11 days supply | Qty: 3 | Fill #0

## 2018-12-18 MED FILL — LOTEPREDNOL ETABONATE 0.5 %: 0.5 | 25 days supply | Qty: 5 | Fill #1

## 2018-12-22 DIAGNOSIS — H04123 Dry eye syndrome of bilateral lacrimal glands: Secondary | ICD-10-CM | POA: Diagnosis not present

## 2018-12-22 DIAGNOSIS — H16223 Keratoconjunctivitis sicca, not specified as Sjogren's, bilateral: Secondary | ICD-10-CM | POA: Diagnosis not present

## 2018-12-22 DIAGNOSIS — H16123 Filamentary keratitis, bilateral: Secondary | ICD-10-CM | POA: Diagnosis not present

## 2018-12-23 DIAGNOSIS — R5383 Other fatigue: Secondary | ICD-10-CM | POA: Diagnosis not present

## 2018-12-23 DIAGNOSIS — D696 Thrombocytopenia, unspecified: Secondary | ICD-10-CM | POA: Diagnosis not present

## 2018-12-23 DIAGNOSIS — M3500 Sicca syndrome, unspecified: Secondary | ICD-10-CM | POA: Diagnosis not present

## 2018-12-23 DIAGNOSIS — H169 Unspecified keratitis: Secondary | ICD-10-CM | POA: Diagnosis not present

## 2018-12-23 MED FILL — predniSONE 1 MG TABS: 1 | 30 days supply | Qty: 120 | Fill #0

## 2018-12-24 DIAGNOSIS — H16202 Unspecified keratoconjunctivitis, left eye: Secondary | ICD-10-CM | POA: Diagnosis not present

## 2018-12-24 DIAGNOSIS — H04122 Dry eye syndrome of left lacrimal gland: Secondary | ICD-10-CM | POA: Diagnosis not present

## 2018-12-24 DIAGNOSIS — H16222 Keratoconjunctivitis sicca, not specified as Sjogren's, left eye: Secondary | ICD-10-CM | POA: Diagnosis not present

## 2018-12-29 DIAGNOSIS — Z8669 Personal history of other diseases of the nervous system and sense organs: Secondary | ICD-10-CM | POA: Diagnosis not present

## 2018-12-29 DIAGNOSIS — H04123 Dry eye syndrome of bilateral lacrimal glands: Secondary | ICD-10-CM | POA: Diagnosis not present

## 2018-12-29 MED FILL — MOXIFLOXACIN HCL 0.5 % SOLN: 0.5 | 15 days supply | Qty: 3 | Fill #1

## 2019-01-02 DIAGNOSIS — H04123 Dry eye syndrome of bilateral lacrimal glands: Secondary | ICD-10-CM | POA: Diagnosis not present

## 2019-01-02 DIAGNOSIS — Z8669 Personal history of other diseases of the nervous system and sense organs: Secondary | ICD-10-CM | POA: Diagnosis not present

## 2019-01-05 DIAGNOSIS — H04123 Dry eye syndrome of bilateral lacrimal glands: Secondary | ICD-10-CM | POA: Diagnosis not present

## 2019-01-05 DIAGNOSIS — H16202 Unspecified keratoconjunctivitis, left eye: Secondary | ICD-10-CM | POA: Diagnosis not present

## 2019-01-05 DIAGNOSIS — H16123 Filamentary keratitis, bilateral: Secondary | ICD-10-CM | POA: Diagnosis not present

## 2019-01-05 DIAGNOSIS — H16223 Keratoconjunctivitis sicca, not specified as Sjogren's, bilateral: Secondary | ICD-10-CM | POA: Diagnosis not present

## 2019-01-07 MED FILL — LOTEPREDNOL ETABONATE 0.5 %: 0.5 | 25 days supply | Qty: 5 | Fill #2

## 2019-01-14 DIAGNOSIS — H0288B Meibomian gland dysfunction left eye, upper and lower eyelids: Secondary | ICD-10-CM | POA: Diagnosis not present

## 2019-01-14 DIAGNOSIS — Z8669 Personal history of other diseases of the nervous system and sense organs: Secondary | ICD-10-CM | POA: Diagnosis not present

## 2019-01-14 DIAGNOSIS — H0288A Meibomian gland dysfunction right eye, upper and lower eyelids: Secondary | ICD-10-CM | POA: Diagnosis not present

## 2019-01-16 DIAGNOSIS — H16202 Unspecified keratoconjunctivitis, left eye: Secondary | ICD-10-CM | POA: Diagnosis not present

## 2019-01-16 DIAGNOSIS — H16223 Keratoconjunctivitis sicca, not specified as Sjogren's, bilateral: Secondary | ICD-10-CM | POA: Diagnosis not present

## 2019-01-16 DIAGNOSIS — H04123 Dry eye syndrome of bilateral lacrimal glands: Secondary | ICD-10-CM | POA: Diagnosis not present

## 2019-01-26 DIAGNOSIS — H16202 Unspecified keratoconjunctivitis, left eye: Secondary | ICD-10-CM | POA: Diagnosis not present

## 2019-01-26 DIAGNOSIS — H16223 Keratoconjunctivitis sicca, not specified as Sjogren's, bilateral: Secondary | ICD-10-CM | POA: Diagnosis not present

## 2019-01-26 DIAGNOSIS — H04123 Dry eye syndrome of bilateral lacrimal glands: Secondary | ICD-10-CM | POA: Diagnosis not present

## 2019-02-09 DIAGNOSIS — H16223 Keratoconjunctivitis sicca, not specified as Sjogren's, bilateral: Secondary | ICD-10-CM | POA: Diagnosis not present

## 2019-02-09 DIAGNOSIS — H04123 Dry eye syndrome of bilateral lacrimal glands: Secondary | ICD-10-CM | POA: Diagnosis not present

## 2019-02-09 DIAGNOSIS — H16202 Unspecified keratoconjunctivitis, left eye: Secondary | ICD-10-CM | POA: Diagnosis not present

## 2019-02-09 MED FILL — LOTEPREDNOL ETABONATE 0.5 %: 0.5 | 19 days supply | Qty: 5 | Fill #0

## 2019-02-16 MED FILL — predniSONE 1 MG TABS: 1 | 30 days supply | Qty: 120 | Fill #0

## 2019-02-23 ENCOUNTER — Other Ambulatory Visit: Payer: Self-pay

## 2019-02-23 DIAGNOSIS — D696 Thrombocytopenia, unspecified: Secondary | ICD-10-CM | POA: Diagnosis not present

## 2019-02-23 DIAGNOSIS — D72819 Decreased white blood cell count, unspecified: Secondary | ICD-10-CM | POA: Diagnosis not present

## 2019-02-23 DIAGNOSIS — M3500 Sicca syndrome, unspecified: Secondary | ICD-10-CM | POA: Insufficient documentation

## 2019-02-23 DIAGNOSIS — H169 Unspecified keratitis: Secondary | ICD-10-CM | POA: Diagnosis not present

## 2019-02-23 MED FILL — HYDROXYCHLOROQUINE SULFATE: 200 | 30 days supply | Qty: 30 | Fill #0

## 2019-03-02 DIAGNOSIS — H16223 Keratoconjunctivitis sicca, not specified as Sjogren's, bilateral: Secondary | ICD-10-CM | POA: Diagnosis not present

## 2019-03-02 DIAGNOSIS — H16202 Unspecified keratoconjunctivitis, left eye: Secondary | ICD-10-CM | POA: Diagnosis not present

## 2019-03-02 DIAGNOSIS — M3500 Sicca syndrome, unspecified: Secondary | ICD-10-CM | POA: Diagnosis not present

## 2019-03-02 DIAGNOSIS — H04123 Dry eye syndrome of bilateral lacrimal glands: Secondary | ICD-10-CM | POA: Diagnosis not present

## 2019-03-04 DIAGNOSIS — H0288A Meibomian gland dysfunction right eye, upper and lower eyelids: Secondary | ICD-10-CM | POA: Diagnosis not present

## 2019-03-04 DIAGNOSIS — H0288B Meibomian gland dysfunction left eye, upper and lower eyelids: Secondary | ICD-10-CM | POA: Diagnosis not present

## 2019-03-04 DIAGNOSIS — H04129 Dry eye syndrome of unspecified lacrimal gland: Secondary | ICD-10-CM | POA: Diagnosis not present

## 2019-03-04 DIAGNOSIS — H018 Other specified inflammations of eyelid: Secondary | ICD-10-CM | POA: Diagnosis not present

## 2019-03-10 DIAGNOSIS — H04123 Dry eye syndrome of bilateral lacrimal glands: Secondary | ICD-10-CM | POA: Diagnosis not present

## 2019-03-10 DIAGNOSIS — H16202 Unspecified keratoconjunctivitis, left eye: Secondary | ICD-10-CM | POA: Diagnosis not present

## 2019-03-10 DIAGNOSIS — H16223 Keratoconjunctivitis sicca, not specified as Sjogren's, bilateral: Secondary | ICD-10-CM | POA: Diagnosis not present

## 2019-03-10 MED FILL — RESTASIS 0.05% EYE EMULSION: 0.05 | 30 days supply | Qty: 60 | Fill #0

## 2019-03-16 DIAGNOSIS — M359 Systemic involvement of connective tissue, unspecified: Secondary | ICD-10-CM | POA: Insufficient documentation

## 2019-03-16 DIAGNOSIS — D696 Thrombocytopenia, unspecified: Secondary | ICD-10-CM | POA: Diagnosis not present

## 2019-03-19 MED FILL — HYDROXYCHLOROQUINE SULFATE: 200 | 90 days supply | Qty: 135 | Fill #0

## 2019-03-19 MED FILL — LOTEPREDNOL ETABONATE 0.5 %: 0.5 | 19 days supply | Qty: 5 | Fill #1

## 2019-03-31 DIAGNOSIS — H16202 Unspecified keratoconjunctivitis, left eye: Secondary | ICD-10-CM | POA: Diagnosis not present

## 2019-03-31 DIAGNOSIS — H16223 Keratoconjunctivitis sicca, not specified as Sjogren's, bilateral: Secondary | ICD-10-CM | POA: Diagnosis not present

## 2019-03-31 DIAGNOSIS — H04123 Dry eye syndrome of bilateral lacrimal glands: Secondary | ICD-10-CM | POA: Diagnosis not present

## 2019-04-02 ENCOUNTER — Encounter: Payer: Self-pay | Admitting: Family Medicine

## 2019-04-06 ENCOUNTER — Ambulatory Visit (INDEPENDENT_AMBULATORY_CARE_PROVIDER_SITE_OTHER): Payer: 59 | Admitting: Family Medicine

## 2019-04-06 ENCOUNTER — Encounter: Payer: Self-pay | Admitting: Family Medicine

## 2019-04-06 ENCOUNTER — Other Ambulatory Visit: Payer: Self-pay

## 2019-04-06 VITALS — BP 110/72 | HR 74 | Temp 98.3°F | Ht 63.0 in | Wt 106.0 lb

## 2019-04-06 DIAGNOSIS — Z1239 Encounter for other screening for malignant neoplasm of breast: Secondary | ICD-10-CM

## 2019-04-06 DIAGNOSIS — M3500 Sicca syndrome, unspecified: Secondary | ICD-10-CM

## 2019-04-06 DIAGNOSIS — D696 Thrombocytopenia, unspecified: Secondary | ICD-10-CM

## 2019-04-06 DIAGNOSIS — D8989 Other specified disorders involving the immune mechanism, not elsewhere classified: Secondary | ICD-10-CM

## 2019-04-06 DIAGNOSIS — D61818 Other pancytopenia: Secondary | ICD-10-CM | POA: Diagnosis not present

## 2019-04-06 DIAGNOSIS — R634 Abnormal weight loss: Secondary | ICD-10-CM

## 2019-04-06 DIAGNOSIS — E039 Hypothyroidism, unspecified: Secondary | ICD-10-CM | POA: Diagnosis not present

## 2019-04-06 LAB — CBC WITH DIFFERENTIAL/PLATELET
Basophils Absolute: 0 10*3/uL (ref 0.0–0.1)
Basophils Relative: 1.2 % (ref 0.0–3.0)
Eosinophils Absolute: 0.1 10*3/uL (ref 0.0–0.7)
Eosinophils Relative: 1.7 % (ref 0.0–5.0)
HCT: 37.8 % (ref 36.0–46.0)
Hemoglobin: 12.7 g/dL (ref 12.0–15.0)
Lymphocytes Relative: 12.9 % (ref 12.0–46.0)
Lymphs Abs: 0.5 10*3/uL — ABNORMAL LOW (ref 0.7–4.0)
MCHC: 33.6 g/dL (ref 30.0–36.0)
MCV: 95.9 fl (ref 78.0–100.0)
Monocytes Absolute: 0.4 10*3/uL (ref 0.1–1.0)
Monocytes Relative: 10 % (ref 3.0–12.0)
Neutro Abs: 3 10*3/uL (ref 1.4–7.7)
Neutrophils Relative %: 74.2 % (ref 43.0–77.0)
Platelets: 101 10*3/uL — ABNORMAL LOW (ref 150.0–400.0)
RBC: 3.94 Mil/uL (ref 3.87–5.11)
RDW: 13 % (ref 11.5–15.5)
WBC: 4 10*3/uL (ref 4.0–10.5)

## 2019-04-06 LAB — COMPREHENSIVE METABOLIC PANEL
ALT: 16 U/L (ref 0–35)
AST: 18 U/L (ref 0–37)
Albumin: 4.5 g/dL (ref 3.5–5.2)
Alkaline Phosphatase: 61 U/L (ref 39–117)
BUN: 14 mg/dL (ref 6–23)
CO2: 29 mEq/L (ref 19–32)
Calcium: 9.6 mg/dL (ref 8.4–10.5)
Chloride: 104 mEq/L (ref 96–112)
Creatinine, Ser: 0.64 mg/dL (ref 0.40–1.20)
GFR: 98.59 mL/min (ref >60.00)
Glucose, Bld: 88 mg/dL (ref 70–99)
Potassium: 4.4 mEq/L (ref 3.5–5.1)
Sodium: 138 mEq/L (ref 135–145)
Total Bilirubin: 0.6 mg/dL (ref 0.2–1.2)
Total Protein: 7.6 g/dL (ref 6.0–8.3)

## 2019-04-06 LAB — TSH: TSH: 1.05 u[IU]/mL (ref 0.35–4.50)

## 2019-04-06 LAB — T4, FREE: Free T4: 0.83 ng/dL (ref 0.60–1.60)

## 2019-04-06 MED FILL — predniSONE 1 MG TABS: 1 | 30 days supply | Qty: 120 | Fill #1

## 2019-04-06 NOTE — Patient Instructions (Signed)
Please return in 4 weeks to recheck your weight.   Please increase your caloric daily intake.  I will release your lab results to you on your MyChart account with further instructions. Please reply with any questions.    If you have any questions or concerns, please don't hesitate to send me a message via MyChart or call the office at [phone]. Thank you for visiting with [place] today! It's our pleasure caring for you.

## 2019-04-06 NOTE — Progress Notes (Signed)
Subjective  CC:  Chief Complaint  Patient presents with  . Weight Loss    noticeable weightloss. on prednisonenot having suppressed appetitie    HPI: Sarah [name] is a 49 y.o. female who presents to the office today to address the problems listed above in the chief complaint.  49 yo who has struggled over the lats 6 months due to severe dry eyes and auto-immune disorder. I have reviewed her chart and recent notes from eye doctors and [facility].   Severe keratitis due to dry eye, from underlying autoimmune disorder: has had multiple different treatments including topical steroids, and multiple procedures she has documented for me. Fortunately, she is able to see again. She was out of work for 2 months due these problems.   As well, has had fevers, arthralgias, and recurrent rash. [facility] is now treating with plaquenil in addition to oral steroids: trying to taper off steroids. ? Lupus vs sjogren's vs other.   She is tired and has had a hard year. Now noting weight loss. Down about 3-4 pounds.   Eats twice daily: breakfast: 2 eggs and naan bread. Then after work she will eat a normal dinner: vegetables, stir fry with rice, and snacks. Appetite is fine. Admits to having been very stressed. As well, metabolic needs likely higher than normal with intermittent fevers and inflammation. Denies night sweats, breast mass, melena or noted nodules on exam. Has very dense breast tissue: last mammo was 2015. Requests breast MRI. Has not yet had CRC screen. Nl pap 2019 w/ neg hpv. No recent travel.   Wt Readings from Last 3 Encounters:  04/06/19 106 lb (48.1 kg)  07/17/18 110 lb 12.8 oz (50.3 kg)  06/21/17 110 lb (49.9 kg)    Assessment  1. Unintended weight loss   2. Autoimmune disorder ([place])   3. Acquired hypothyroidism   4. Pancytopenia ([place])   5. Thrombocytopenia ([place])   6. Sicca syndrome ([facility])   7. 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 Will work on increasing caloric intake and recheck weight in 4 weeks.   rec mammogram and/or breast MRI  Check labs  Follow up: 4 weeks to recheck weight  07/20/2019  Orders Placed This Encounter  Procedures  . MM DIGITAL SCREENING BILATERAL  . MR BREAST BILATERAL W CONTRAST  . T4, free  . T3  . TSH  . CBC with Differential/Platelet  . Comprehensive metabolic panel  . Lactate Dehydrogenase (LDH)   No orders of the defined types were placed in this encounter.     I reviewed the patients updated PMH, FH, and SocHx.    Patient Active Problem List   Diagnosis Date Noted  . Sicca syndrome ([place]) 02/23/2019  . Vitamin D deficiency 07/17/2018  . Pancytopenia ([place]) 07/02/2017  . Fibroid uterus 06/21/2017  . Chronic urticaria 05/06/2014  . Autoimmune disorder ([place]) 04/08/2012  . Hypothyroidism 04/08/2012  . Rhinitis 04/08/2012  . Infertility, female 11/13/2010  . Thrombocytopenia ([place]) 11/13/2010   Current Meds  Medication Sig  . Ascorbic Acid (VITAMIN C) 100 MG CHEW Chew 1 tablet by mouth daily.  . clobetasol ointment (TEMOVATE) [id number] % Apply 1 application topically 2 (two) times daily.  Marland Kitchen desonide (DESOWEN) 0.05 % cream APPLY TOPICALLY 2 (TWO) TIMES DAILY.  . hydroxychloroquine (PLAQUENIL) 200  MG tablet Take 200 mg by mouth daily.  Vladimir Faster Glycol-Propyl Glycol 0.4-0.3 % SOLN Apply to eye.  . predniSONE 2 MG TBEC Take 4 tablets by mouth daily.   . Vitamin D, Ergocalciferol, (DRISDOL) 1.25 MG (50000 UNIT) CAPS capsule Take 50,000 Units by mouth every 7 (seven) days.    Allergies: Patient is allergic to lactose intolerance (gi); latex; and tape. Family History: Patient family history includes Lupus in her sister. Social History:  Patient  reports that she has never smoked. She has never used smokeless tobacco. She reports that she does not drink alcohol or use  drugs.  Review of Systems: Constitutional: Negative for fever malaise or anorexia Cardiovascular: negative for chest pain Respiratory: negative for SOB or persistent cough Gastrointestinal: negative for abdominal pain  Objective  Vitals: BP 110/72 (BP Location: Right Arm, Patient Position: Sitting, Cuff Size: Normal)   Pulse 74   Temp 98.3 F (36.8 C) (Temporal)   Ht 5\' 3"  (1.6 m)   Wt 106 lb (48.1 kg)   LMP 03/24/2019   SpO2 97%   BMI 18.78 kg/m  General: no acute distress , A&Ox3 but appears tired HEENT: PEERL, conjunctiva normal, no lad,neck is supple Cardiovascular:  RRR without murmur or gallop.  Respiratory:  Good breath sounds bilaterally, CTAB with normal respiratory effort Gastrointestinal: soft, flat abdomen, normal active bowel sounds, no palpable masses, no hepatosplenomegaly, no appreciated hernias Skin:  Warm, no rashes Ext: no edema     Commons side effects, risks, benefits, and alternatives for medications and treatment plan prescribed today were discussed, and the patient expressed understanding of the given instructions. Patient is instructed to call or message via MyChart if he/she has any questions or concerns regarding our treatment plan. No barriers to understanding were identified. We discussed Red Flag symptoms and signs in detail. Patient expressed understanding regarding what to do in case of urgent or emergency type symptoms.   Medication list was reconciled, printed and provided to the patient in AVS. Patient instructions and summary information was reviewed with the patient as documented in the AVS. This note was prepared with assistance of Dragon voice recognition software. Occasional wrong-word or sound-a-like substitutions may have occurred due to the inherent limitations of voice recognition software  This visit occurred during the SARS-CoV-2 public health emergency.  Safety protocols were in place, including screening questions prior to the visit,  additional usage of staff PPE, and extensive cleaning of exam room while observing appropriate contact time as indicated for disinfecting solutions.

## 2019-04-06 NOTE — Addendum Note (Signed)
Addended by: Billey Chang on: 04/06/2019 10:56 AM   Modules accepted: Orders

## 2019-04-07 LAB — LACTATE DEHYDROGENASE: LDH: 118 U/L (ref 100–200)

## 2019-04-07 LAB — T3: T3, Total: 94 ng/dL (ref 76–181)

## 2019-04-08 ENCOUNTER — Other Ambulatory Visit: Payer: Self-pay | Admitting: Family Medicine

## 2019-04-08 DIAGNOSIS — Z1231 Encounter for screening mammogram for malignant neoplasm of breast: Secondary | ICD-10-CM

## 2019-04-20 ENCOUNTER — Encounter: Payer: Self-pay | Admitting: Family Medicine

## 2019-04-20 NOTE — Telephone Encounter (Signed)
Yes please

## 2019-04-20 NOTE — Telephone Encounter (Signed)
Ok to make change 

## 2019-04-21 DIAGNOSIS — H16223 Keratoconjunctivitis sicca, not specified as Sjogren's, bilateral: Secondary | ICD-10-CM | POA: Diagnosis not present

## 2019-04-21 DIAGNOSIS — H04123 Dry eye syndrome of bilateral lacrimal glands: Secondary | ICD-10-CM | POA: Diagnosis not present

## 2019-04-22 ENCOUNTER — Other Ambulatory Visit: Payer: Self-pay | Admitting: Family Medicine

## 2019-04-22 ENCOUNTER — Telehealth: Payer: Self-pay | Admitting: Family Medicine

## 2019-04-22 ENCOUNTER — Other Ambulatory Visit: Payer: Self-pay

## 2019-04-22 DIAGNOSIS — Z8669 Personal history of other diseases of the nervous system and sense organs: Secondary | ICD-10-CM | POA: Diagnosis not present

## 2019-04-22 DIAGNOSIS — H04123 Dry eye syndrome of bilateral lacrimal glands: Secondary | ICD-10-CM | POA: Diagnosis not present

## 2019-04-22 DIAGNOSIS — R634 Abnormal weight loss: Secondary | ICD-10-CM

## 2019-04-22 DIAGNOSIS — H018 Other specified inflammations of eyelid: Secondary | ICD-10-CM | POA: Diagnosis not present

## 2019-04-22 DIAGNOSIS — Z1239 Encounter for other screening for malignant neoplasm of breast: Secondary | ICD-10-CM

## 2019-04-22 NOTE — Telephone Encounter (Signed)
Patient called in saying she tried to get scheduled for the MRI but was told that the order is needing to be changed from contrast only to with/without contrast.

## 2019-04-22 NOTE — Telephone Encounter (Signed)
MRI breast order changed

## 2019-04-22 NOTE — Telephone Encounter (Signed)
Please change accordingly

## 2019-04-22 NOTE — Telephone Encounter (Signed)
Please advise 

## 2019-05-04 ENCOUNTER — Ambulatory Visit (INDEPENDENT_AMBULATORY_CARE_PROVIDER_SITE_OTHER): Payer: 59 | Admitting: Family Medicine

## 2019-05-04 ENCOUNTER — Other Ambulatory Visit: Payer: Self-pay

## 2019-05-04 ENCOUNTER — Encounter: Payer: Self-pay | Admitting: Family Medicine

## 2019-05-04 VITALS — BP 108/72 | HR 91 | Temp 98.4°F | Resp 15 | Ht 63.0 in | Wt 106.4 lb

## 2019-05-04 DIAGNOSIS — R634 Abnormal weight loss: Secondary | ICD-10-CM | POA: Diagnosis not present

## 2019-05-04 DIAGNOSIS — Z1211 Encounter for screening for malignant neoplasm of colon: Secondary | ICD-10-CM | POA: Diagnosis not present

## 2019-05-04 DIAGNOSIS — Z1212 Encounter for screening for malignant neoplasm of rectum: Secondary | ICD-10-CM | POA: Diagnosis not present

## 2019-05-04 DIAGNOSIS — D8989 Other specified disorders involving the immune mechanism, not elsewhere classified: Secondary | ICD-10-CM

## 2019-05-04 NOTE — Patient Instructions (Signed)
Please return in 6 months for recheck.   If you have any questions or concerns, please don't hesitate to send me a message via MyChart or call the office at [phone]. Thank you for visiting with [place] today! It's our pleasure caring for you.  We will call you with information regarding your referral appointment. Dr. Hilarie Fredrickson for colonoscopy. If you do not hear from [place] within the next 2 weeks, please let me know. It can take 1-2 weeks to get appointments set up with the specialists.   Consider taking omeprazole 20mg  daily for possible gastritis/gerd and to protect your stomach.  Perhaps add back some healthy meat to your diet Add protein shakes as well.

## 2019-05-04 NOTE — Progress Notes (Signed)
Subjective  CC:  Chief Complaint  Patient presents with  . Weight Check    103 lb this morning, states she is having difficulty with increase calorie intake    HPI: Sarah [name] is a 49 y.o. female who presents to the office today to address the problems listed above in the chief complaint.  F/u for 4 weeks ago: initial visit for weight loss. I reviewed that visit notes and all lab test results as listed below. Pt is stable. Weight is stable, up about half a pound. No new sxs although does c/o of "worsening lactose intolerance". occ GERD sxs but no persistent upper abd pain, n/v. Has mammo w/ breast MRI scheduled. Due for colonoscopy. avg risk pt + unintended weight loss. No anemia.   Reviewed eating habits and weight from last 3 years. Over last 10 months, she has lost 4 pounds. Prior to covid, she ate out about twice a week: would take in more calories w/ meat then. Now mostly vegetarian diet. Good appetite. Trying to supplement meals with fruit punch and gingerale.   Wt Readings from Last 3 Encounters:  05/04/19 106 lb 6.4 oz (48.3 kg)  04/06/19 106 lb (48.1 kg)  07/17/18 110 lb 12.8 oz (50.3 kg)   No visits with results within 1 Day(s) from this visit.  Latest known visit with results is:  Office Visit on 04/06/2019  Component Date Value Ref Range Status  . Free T4 04/06/2019 0.83  0.60 - 1.60 ng/dL Final  . T3, Total 04/06/2019 94  76 - 181 ng/dL Final  . TSH 04/06/2019 1.05  0.35 - 4.50 uIU/mL Final  . WBC 04/06/2019 4.0  4.0 - 10.5 K/uL Final  . RBC 04/06/2019 3.94  3.87 - 5.11 Mil/uL Final  . Hemoglobin 04/06/2019 12.7  12.0 - 15.0 g/dL Final  . HCT 04/06/2019 37.8  36.0 - 46.0 % Final  . MCV 04/06/2019 95.9  78.0 - 100.0 fl Final  . MCHC 04/06/2019 33.6  30.0 - 36.0 g/dL Final  . RDW 04/06/2019 13.0  11.5 - 15.5 % Final  . Platelets 04/06/2019 101.0* 150.0 - 400.0 K/uL Final  . Neutrophils Relative % 04/06/2019 74.2  43.0 - 77.0 % Final  . Lymphocytes Relative 04/06/2019  12.9  12.0 - 46.0 % Final  . Monocytes Relative 04/06/2019 10.0  3.0 - 12.0 % Final  . Eosinophils Relative 04/06/2019 1.7  0.0 - 5.0 % Final  . Basophils Relative 04/06/2019 1.2  0.0 - 3.0 % Final  . Neutro Abs 04/06/2019 3.0  1.4 - 7.7 K/uL Final  . Lymphs Abs 04/06/2019 0.5* 0.7 - 4.0 K/uL Final  . Monocytes Absolute 04/06/2019 0.4  0.1 - 1.0 K/uL Final  . Eosinophils Absolute 04/06/2019 0.1  0.0 - 0.7 K/uL Final  . Basophils Absolute 04/06/2019 0.0  0.0 - 0.1 K/uL Final  . Sodium 04/06/2019 138  135 - 145 mEq/L Final  . Potassium 04/06/2019 4.4  3.5 - 5.1 mEq/L Final  . Chloride 04/06/2019 104  96 - 112 mEq/L Final  . CO2 04/06/2019 29  19 - 32 mEq/L Final  . Glucose, Bld 04/06/2019 88  70 - 99 mg/dL Final  . BUN 04/06/2019 14  6 - 23 mg/dL Final  . Creatinine, Ser 04/06/2019 0.64  0.40 - 1.20 mg/dL Final  . Total Bilirubin 04/06/2019 0.6  0.2 - 1.2 mg/dL Final  . Alkaline Phosphatase 04/06/2019 61  39 - 117 U/L Final  . AST 04/06/2019 18  0 - 37  U/L Final  . ALT 04/06/2019 16  0 - 35 U/L Final  . Total Protein 04/06/2019 7.6  6.0 - 8.3 g/dL Final  . Albumin 04/06/2019 4.5  3.5 - 5.2 g/dL Final  . GFR 04/06/2019 98.59  >60.00 mL/min Final  . Calcium 04/06/2019 9.6  8.4 - 10.5 mg/dL Final  . LDH 04/06/2019 118  100 - 200 U/L Final    Assessment  1. Unintended weight loss   2. Screening for colorectal cancer   3. Autoimmune disorder ([place])      Plan   Weight loss: stable and  Likely multifactorial due to stress, ongoing autoimmunie/inflammatory process, and decrease caloric intake. rec increasing calories with lean fats (olive oil, avocado, peanut butter) and protein with protein shakes. Consider PPI.   Refer for colonoscopy.   Breast mammogram and MRI soon.   Follow up: No follow-ups on file.  07/20/2019  Orders Placed This Encounter  Procedures  . Ambulatory referral to Gastroenterology   No orders of the defined types were placed in this encounter.     I  reviewed the patients updated PMH, FH, and SocHx.    Patient Active Problem List   Diagnosis Date Noted  . Undifferentiated connective tissue disease ([place]) 03/16/2019  . Sicca syndrome ([place]) 02/23/2019  . Vitamin D deficiency 07/17/2018  . Pancytopenia ([place]) 07/02/2017  . Fibroid uterus 06/21/2017  . Chronic urticaria 05/06/2014  . Autoimmune disorder ([place]) 04/08/2012  . Hypothyroidism 04/08/2012  . Rhinitis 04/08/2012  . Infertility, female 11/13/2010  . Thrombocytopenia ([place]) 11/13/2010   Current Meds  Medication Sig  . Ascorbic Acid (VITAMIN C) 100 MG CHEW Chew 1 tablet by mouth daily.  . clobetasol ointment (TEMOVATE) [id number] % Apply 1 application topically 2 (two) times daily.  Marland Kitchen desonide (DESOWEN) 0.05 % cream APPLY TOPICALLY 2 (TWO) TIMES DAILY.  . hydroxychloroquine (PLAQUENIL) 200 MG tablet Take 200 mg by mouth daily.  Vladimir Faster Glycol-Propyl Glycol 0.4-0.3 % SOLN Apply to eye.  . predniSONE 2 MG TBEC Take 4 tablets by mouth daily.   . Vitamin D, Ergocalciferol, (DRISDOL) 1.25 MG (50000 UNIT) CAPS capsule Take 50,000 Units by mouth every 7 (seven) days.    Allergies: Patient is allergic to lactose intolerance (gi); latex; and tape. Family History: Patient family history includes Lupus in her sister. Social History:  Patient  reports that she has never smoked. She has never used smokeless tobacco. She reports that she does not drink alcohol or use drugs.  Review of Systems: Constitutional: Negative for fever malaise or anorexia Cardiovascular: negative for chest pain Respiratory: negative for SOB or persistent cough Gastrointestinal: negative for abdominal pain  Objective  Vitals: BP 108/72   Pulse 91   Temp 98.4 F (36.9 C) (Temporal)   Resp 15   Ht 5\' 3"  (1.6 m)   Wt 106 lb 6.4 oz (48.3 kg)   SpO2 99%   BMI 18.85 kg/m  General: no acute distress , A&Ox3   Commons side effects, risks, benefits, and alternatives for medications and treatment plan  prescribed today were discussed, and the patient expressed understanding of the given instructions. Patient is instructed to call or message via MyChart if he/she has any questions or concerns regarding our treatment plan. No barriers to understanding were identified. We discussed Red Flag symptoms and signs in detail. Patient expressed understanding regarding what to do in case of urgent or emergency type symptoms.   Medication list was reconciled, printed and provided to the patient in AVS. Patient  instructions and summary information was reviewed with the patient as documented in the AVS. This note was prepared with assistance of Dragon voice recognition software. Occasional wrong-word or sound-a-like substitutions may have occurred due to the inherent limitations of voice recognition software  This visit occurred during the SARS-CoV-2 public health emergency.  Safety protocols were in place, including screening questions prior to the visit, additional usage of staff PPE, and extensive cleaning of exam room while observing appropriate contact time as indicated for disinfecting solutions.

## 2019-05-05 ENCOUNTER — Other Ambulatory Visit: Payer: Self-pay | Admitting: Family Medicine

## 2019-05-05 ENCOUNTER — Ambulatory Visit
Admission: RE | Admit: 2019-05-05 | Discharge: 2019-05-05 | Disposition: A | Discharge status: Home / Self Care | Payer: 59 | Source: Ambulatory Visit | Attending: Family Medicine | Admitting: Family Medicine

## 2019-05-05 ENCOUNTER — Ambulatory Visit (INDEPENDENT_AMBULATORY_CARE_PROVIDER_SITE_OTHER): Payer: 59 | Admitting: Internal Medicine

## 2019-05-05 ENCOUNTER — Encounter: Payer: Self-pay | Admitting: Internal Medicine

## 2019-05-05 VITALS — BP 128/72 | HR 81 | Temp 98.2°F | Ht 63.0 in | Wt 108.0 lb

## 2019-05-05 DIAGNOSIS — R634 Abnormal weight loss: Secondary | ICD-10-CM | POA: Diagnosis not present

## 2019-05-05 DIAGNOSIS — Z1211 Encounter for screening for malignant neoplasm of colon: Secondary | ICD-10-CM | POA: Diagnosis not present

## 2019-05-05 DIAGNOSIS — Z1231 Encounter for screening mammogram for malignant neoplasm of breast: Secondary | ICD-10-CM

## 2019-05-05 DIAGNOSIS — R6881 Early satiety: Secondary | ICD-10-CM

## 2019-05-05 DIAGNOSIS — R921 Mammographic calcification found on diagnostic imaging of breast: Secondary | ICD-10-CM

## 2019-05-05 DIAGNOSIS — K219 Gastro-esophageal reflux disease without esophagitis: Secondary | ICD-10-CM | POA: Diagnosis not present

## 2019-05-05 MED ORDER — SUTAB 1479-225-188 MG PO TABS: 1.0000 | ORAL_TABLET | ORAL | 0 refills | Status: DC

## 2019-05-05 MED FILL — SUTAB 1479-225-188 MG TABS: 1479-225-18 | 2 days supply | Qty: 24 | Fill #0

## 2019-05-05 NOTE — Patient Instructions (Signed)
You have been scheduled for an endoscopy and colonoscopy. Please follow the written instructions given to you at your visit today. Please pick up your prep supplies at the pharmacy within the next 1-3 days. If you use inhalers (even only as needed), please bring them with you on the day of your procedure. Your physician has requested that you go to www.startemmi.com and enter the access code given to you at your visit today. This web site gives a general overview about your procedure. However, you should still follow specific instructions given to you by our office regarding your preparation for the procedure.  If you are age 43 or older, your body mass index should be between 23-30. Your Body mass index is 19.13 kg/m. If this is out of the aforementioned range listed, please consider follow up with your Primary Care Provider.  If you are age 30 or younger, your body mass index should be between 19-25. Your Body mass index is 19.13 kg/m. If this is out of the aformentioned range listed, please consider follow up with your Primary Care Provider.   Due to recent changes in healthcare laws, you may see the results of your imaging and laboratory studies on MyChart before your provider has had a chance to review them.  We understand that in some cases there may be results that are confusing or concerning to you. Not all laboratory results come back in the same time frame and the provider may be waiting for multiple results in order to interpret others.  Please give [place] 48 hours in order for your provider to thoroughly review all the results before contacting the office for clarification of your results.

## 2019-05-05 NOTE — Progress Notes (Signed)
Patient ID: Sarah [name], female   DOB: [birth year], 49 y.o.   MRN: [id number] HPI: Grettel Pfeiler is a 49 year old female with a history of Sjogren's syndrome, gallstones status post cholecystectomy, hypothyroidism, chronic thrombocytopenia felt related to autoimmune disease who is seen in consultation at the request of Billey Chang, MD to evaluate weight loss.  She is here alone today.  She reports that over the last 4 to 5 years she has lost about 15 pounds.  This is largely unexplained and unintentional.  In April 2016 she weighed 120 pounds and today she is 106.  She reports that her weight loss has worried her as colleagues mention frequently that she is then or appears to have lost weight.  This is despite a good appetite.  She does feel that she has slow GI motility and will feel full quickly with eating.  She is dealt on and off with acid reflux symptoms for years.  For her this is heartburn and tends to be most significant if she eats late at night.  It can be associated with cough if she lies down shortly after eating particularly late at night.  She does not have heartburn during the day.  There is no dysphagia or odynophagia symptoms.  No nausea or vomiting.  She reports she has self diagnosed lactose intolerance and avoids milk and ice cream.  She has used Lactaid tablets with success.  When she eats lactose she will have loose stools and cramping abdominal pain.  Outside of that she does not have abdominal pain.  Her bowel movements are regular and no change in bowel movement.  No blood in her stool or melena.  She did have prior constipation which improved after cholecystectomy.  She sees Dr. Karilyn Cota at [facility] rheumatology for Sjogren's.  She is weaning off of prednisone and has been started on Plaquenil.  Family history negative for GI illness/malignancy.  Her sister has SLE.  She works as a Psychologist, educational for [employer].  Past Medical History:  Diagnosis Date  . ANA positive    ?Sjogrens  . Cholecystitis    . Cholelithiasis   . Dry eye   . Gall stones   . Hypothyroidism    Takes synthroid  . Infertility, female   . Keratitis 2020  . Lactose intolerance   . Leukopenia   . Sjogren's disease ([place])   . Thrombocytopenia ([place])   . Urticaria   . Urticaria    episodes since childhood   . Vitamin D deficiency disease    Takes Vit D  . Weight loss     Past Surgical History:  Procedure Laterality Date  . ANTERIOR CRUCIATE LIGAMENT REPAIR Left ~ 2005  . CHOLECYSTECTOMY N/A 07/13/2014   Procedure: LAPAROSCOPIC CHOLECYSTECTOMY WITH INTRAOPERATIVE CHOLANGIOGRAM;  Surgeon: Rolm Bookbinder, MD;  Location: [facility];  Service: General;  Laterality: N/A;  . ENDOMETRIAL BIOPSY    . FERTILITY SURGERY  ~ 2008; ~ 2011; ~ 2012  . LAPAROSCOPIC CHOLECYSTECTOMY  07/13/2014  . WISDOM TOOTH EXTRACTION      Outpatient Medications Prior to Visit  Medication Sig Dispense Refill  . Ascorbic Acid (VITAMIN C) 100 MG CHEW Chew 1 tablet by mouth daily.    . clobetasol ointment (TEMOVATE) [id number] % Apply 1 application topically 2 (two) times daily. 30 g 0  . desonide (DESOWEN) 0.05 % cream APPLY TOPICALLY 2 (TWO) TIMES DAILY. 30 g 0  . hydroxychloroquine (PLAQUENIL) 200 MG tablet Take 200 mg by mouth daily.    Vladimir Faster  Glycol-Propyl Glycol 0.4-0.3 % SOLN Apply to eye.    . predniSONE 2 MG TBEC Take 1 tablet by mouth daily.     . Vitamin D, Ergocalciferol, (DRISDOL) 1.25 MG (50000 UNIT) CAPS capsule Take 50,000 Units by mouth every 7 (seven) days.     No facility-administered medications prior to visit.    Allergies  Allergen Reactions  . Lactose Intolerance (Gi)   . Latex   . Tape Rash    Family History  Problem Relation Age of Onset  . Lupus Sister   . Colon cancer Neg Hx   . Stomach cancer Neg Hx   . Pancreatic cancer Neg Hx   . Esophageal cancer Neg Hx     Social History   Tobacco Use  . Smoking status: Never Smoker  . Smokeless tobacco: Never Used  Substance Use Topics  . Alcohol use: No     Alcohol/week: 0.0 standard drinks  . Drug use: No    ROS: As per history of present illness, otherwise negative  BP 128/72   Pulse 81   Temp 98.2 F (36.8 C)   Ht 5\' 3"  (1.6 m)   Wt 108 lb (49 kg)   LMP 04/15/2019   BMI 19.13 kg/m  Gen: awake, alert, NAD HEENT: anicteric CV: RRR, no mrg Pulm: CTA b/l Abd: soft, NT/ND, +BS throughout Ext: no c/c/e Neuro: nonfocal  RELEVANT LABS AND IMAGING: CBC    Component Value Date/Time   WBC 4.0 04/06/2019 0940   RBC 3.94 04/06/2019 0940   HGB 12.7 04/06/2019 0940   HGB 12.8 09/12/2016 0941   HCT 37.8 04/06/2019 0940   HCT 39.1 09/12/2016 0941   PLT 101.0 (L) 04/06/2019 0940   PLT 94 (LL) 09/12/2016 0941   MCV 95.9 04/06/2019 0940   MCV 93 09/12/2016 0941   MCH 30.5 09/12/2016 0941   MCH 29.9 07/15/2014 0532   MCHC 33.6 04/06/2019 0940   RDW 13.0 04/06/2019 0940   RDW 13.7 09/12/2016 0941   LYMPHSABS 0.5 (L) 04/06/2019 0940   LYMPHSABS 1.7 09/12/2016 0941   MONOABS 0.4 04/06/2019 0940   EOSABS 0.1 04/06/2019 0940   EOSABS 0.1 09/12/2016 0941   BASOSABS 0.0 04/06/2019 0940   BASOSABS 0.0 09/12/2016 0941    CMP     Component Value Date/Time   NA 138 04/06/2019 0940   NA 138 09/12/2016 0941   K 4.4 04/06/2019 0940   CL 104 04/06/2019 0940   CO2 29 04/06/2019 0940   GLUCOSE 88 04/06/2019 0940   BUN 14 04/06/2019 0940   BUN 14 09/12/2016 0941   CREATININE 0.64 04/06/2019 0940   CALCIUM 9.6 04/06/2019 0940   PROT 7.6 04/06/2019 0940   PROT 7.8 09/12/2016 0941   ALBUMIN 4.5 04/06/2019 0940   ALBUMIN 4.6 09/12/2016 0941   AST 18 04/06/2019 0940   ALT 16 04/06/2019 0940   ALKPHOS 61 04/06/2019 0940   BILITOT 0.6 04/06/2019 0940   BILITOT 0.5 09/12/2016 0941   GFRNONAA 99 09/12/2016 0941   GFRAA 114 09/12/2016 0941    ASSESSMENT/PLAN: 49 year old female with a history of Sjogren's syndrome, gallstones status post cholecystectomy, hypothyroidism, chronic thrombocytopenia felt related to autoimmune disease who is  seen in consultation at the request of Billey Chang, MD to evaluate weight loss.  1. Weight loss/GERD symptom/early fullness --we discussed how there can be GI dysmotility associated with autoimmune conditions including Sjogren's syndrome.  I think her weight loss warrants investigation to exclude GI pathology.  She is also due  for colon cancer screening at age 34.  Her GERD symptoms are intermittent and do not sound alarming.  We will proceed to upper endoscopy and colonoscopy.  We discussed the risk, benefits and alternatives and she is agreeable and wishes to proceed  2.  Sjogren's syndrome --she will continue to follow with [facility] rheumatology.  She is on Plaquenil and weaning down prednisone    Cc:Leamon Arnt, [address] [place],  [place] [phone]

## 2019-05-06 MED FILL — predniSONE 1 MG TABS: 1 | 30 days supply | Qty: 120 | Fill #1

## 2019-05-08 ENCOUNTER — Ambulatory Visit
Admission: RE | Admit: 2019-05-08 | Discharge: 2019-05-08 | Disposition: A | Discharge status: Home / Self Care | Payer: 59 | Source: Ambulatory Visit | Attending: Family Medicine | Admitting: Family Medicine

## 2019-05-08 ENCOUNTER — Other Ambulatory Visit: Payer: Self-pay | Admitting: Family Medicine

## 2019-05-08 ENCOUNTER — Other Ambulatory Visit: Payer: Self-pay

## 2019-05-08 DIAGNOSIS — R921 Mammographic calcification found on diagnostic imaging of breast: Secondary | ICD-10-CM

## 2019-05-08 DIAGNOSIS — R922 Inconclusive mammogram: Secondary | ICD-10-CM | POA: Diagnosis not present

## 2019-05-25 DIAGNOSIS — H169 Unspecified keratitis: Secondary | ICD-10-CM | POA: Diagnosis not present

## 2019-05-25 DIAGNOSIS — M3500 Sicca syndrome, unspecified: Secondary | ICD-10-CM | POA: Diagnosis not present

## 2019-05-25 DIAGNOSIS — R5383 Other fatigue: Secondary | ICD-10-CM | POA: Diagnosis not present

## 2019-05-25 DIAGNOSIS — D696 Thrombocytopenia, unspecified: Secondary | ICD-10-CM | POA: Diagnosis not present

## 2019-05-25 DIAGNOSIS — Z681 Body mass index (BMI) 19 or less, adult: Secondary | ICD-10-CM | POA: Diagnosis not present

## 2019-06-02 DIAGNOSIS — H04123 Dry eye syndrome of bilateral lacrimal glands: Secondary | ICD-10-CM | POA: Diagnosis not present

## 2019-06-02 DIAGNOSIS — H16223 Keratoconjunctivitis sicca, not specified as Sjogren's, bilateral: Secondary | ICD-10-CM | POA: Diagnosis not present

## 2019-06-05 ENCOUNTER — Other Ambulatory Visit: Payer: Self-pay

## 2019-06-05 ENCOUNTER — Ambulatory Visit
Admission: RE | Admit: 2019-06-05 | Discharge: 2019-06-05 | Disposition: A | Discharge status: Home / Self Care | Payer: 59 | Source: Ambulatory Visit | Attending: Family Medicine | Admitting: Family Medicine

## 2019-06-05 ENCOUNTER — Encounter: Payer: Self-pay | Admitting: Internal Medicine

## 2019-06-05 DIAGNOSIS — Z1239 Encounter for other screening for malignant neoplasm of breast: Secondary | ICD-10-CM

## 2019-06-05 DIAGNOSIS — N6311 Unspecified lump in the right breast, upper outer quadrant: Secondary | ICD-10-CM | POA: Diagnosis not present

## 2019-06-05 DIAGNOSIS — R634 Abnormal weight loss: Secondary | ICD-10-CM

## 2019-06-05 DIAGNOSIS — N6312 Unspecified lump in the right breast, upper inner quadrant: Secondary | ICD-10-CM | POA: Diagnosis not present

## 2019-06-05 MED ORDER — GADOBUTROL 1 MMOL/ML IV SOLN
5.0000 mL | Freq: Once | INTRAVENOUS | Status: AC | PRN
  Administered 2019-06-05: 5 mL via INTRAVENOUS

## 2019-06-08 ENCOUNTER — Other Ambulatory Visit: Payer: Self-pay

## 2019-06-08 ENCOUNTER — Telehealth: Payer: Self-pay

## 2019-06-08 ENCOUNTER — Other Ambulatory Visit: Payer: Self-pay | Admitting: Family Medicine

## 2019-06-08 DIAGNOSIS — R928 Other abnormal and inconclusive findings on diagnostic imaging of breast: Secondary | ICD-10-CM

## 2019-06-08 DIAGNOSIS — R921 Mammographic calcification found on diagnostic imaging of breast: Secondary | ICD-10-CM

## 2019-06-08 NOTE — Telephone Encounter (Signed)
Please see staff message from Volanda Napoleon with [facility]

## 2019-06-08 NOTE — Telephone Encounter (Signed)
Which additional tests.  I believe they have scheduled her for biopsy and so have given her results??

## 2019-06-08 NOTE — Telephone Encounter (Addendum)
Sarah [name] with [facility] [phone] called in to have additional test ordered per radiologist. I have ordered the additional tests. They have not contacted patient with results. Do you want me to contact patient or wait until additional tests are ran? Please advise

## 2019-06-09 ENCOUNTER — Other Ambulatory Visit: Payer: Self-pay | Admitting: Family Medicine

## 2019-06-09 DIAGNOSIS — R928 Other abnormal and inconclusive findings on diagnostic imaging of breast: Secondary | ICD-10-CM

## 2019-06-10 ENCOUNTER — Ambulatory Visit
Admission: RE | Admit: 2019-06-10 | Discharge: 2019-06-10 | Disposition: A | Discharge status: Home / Self Care | Payer: 59 | Source: Ambulatory Visit | Attending: Family Medicine | Admitting: Family Medicine

## 2019-06-10 ENCOUNTER — Other Ambulatory Visit: Payer: Self-pay

## 2019-06-10 DIAGNOSIS — R921 Mammographic calcification found on diagnostic imaging of breast: Secondary | ICD-10-CM | POA: Diagnosis not present

## 2019-06-10 DIAGNOSIS — D0502 Lobular carcinoma in situ of left breast: Secondary | ICD-10-CM | POA: Diagnosis not present

## 2019-06-10 DIAGNOSIS — N6092 Unspecified benign mammary dysplasia of left breast: Secondary | ICD-10-CM | POA: Diagnosis not present

## 2019-06-15 ENCOUNTER — Inpatient Hospital Stay: Payer: 59 | Attending: Genetic Counselor | Admitting: Licensed Clinical Social Worker

## 2019-06-15 ENCOUNTER — Ambulatory Visit (AMBULATORY_SURGERY_CENTER): Payer: 59 | Admitting: Internal Medicine

## 2019-06-15 ENCOUNTER — Other Ambulatory Visit: Payer: Self-pay | Admitting: Licensed Clinical Social Worker

## 2019-06-15 ENCOUNTER — Encounter: Payer: Self-pay | Admitting: Licensed Clinical Social Worker

## 2019-06-15 ENCOUNTER — Encounter: Payer: Self-pay | Admitting: Internal Medicine

## 2019-06-15 ENCOUNTER — Other Ambulatory Visit: Payer: Self-pay

## 2019-06-15 ENCOUNTER — Inpatient Hospital Stay: Payer: 59

## 2019-06-15 VITALS — BP 144/79 | HR 62 | Temp 97.1°F | Resp 12 | Ht 63.0 in | Wt 108.0 lb

## 2019-06-15 DIAGNOSIS — D0502 Lobular carcinoma in situ of left breast: Secondary | ICD-10-CM

## 2019-06-15 DIAGNOSIS — Z1211 Encounter for screening for malignant neoplasm of colon: Secondary | ICD-10-CM

## 2019-06-15 DIAGNOSIS — B9681 Helicobacter pylori [H. pylori] as the cause of diseases classified elsewhere: Secondary | ICD-10-CM | POA: Diagnosis not present

## 2019-06-15 DIAGNOSIS — K2970 Gastritis, unspecified, without bleeding: Secondary | ICD-10-CM

## 2019-06-15 DIAGNOSIS — K2950 Unspecified chronic gastritis without bleeding: Secondary | ICD-10-CM

## 2019-06-15 DIAGNOSIS — R634 Abnormal weight loss: Secondary | ICD-10-CM | POA: Diagnosis not present

## 2019-06-15 DIAGNOSIS — K219 Gastro-esophageal reflux disease without esophagitis: Secondary | ICD-10-CM | POA: Diagnosis not present

## 2019-06-15 MED ORDER — SODIUM CHLORIDE 0.9 % IV SOLN: 500.0000 mL | Freq: Once | INTRAVENOUS | Status: DC

## 2019-06-15 NOTE — Patient Instructions (Signed)
Upper Endoscopy:  Handout on Gastritis given to you today  Await pathology results on gastric biopsies   Colonoscopy: Normal, no biopsies done   YOU HAD AN ENDOSCOPIC PROCEDURE TODAY AT [facility]:   Refer to the procedure report that was given to you for any specific questions about what was found during the examination.  If the procedure report does not answer your questions, please call your gastroenterologist to clarify.  If you requested that your care partner not be given the details of your procedure findings, then the procedure report has been included in a sealed envelope for you to review at your convenience later.  YOU SHOULD EXPECT: Some feelings of bloating in the abdomen. Passage of more gas than usual.  Walking can help get rid of the air that was put into your GI tract during the procedure and reduce the bloating. If you had a lower endoscopy (such as a colonoscopy or flexible sigmoidoscopy) you may notice spotting of blood in your stool or on the toilet paper. If you underwent a bowel prep for your procedure, you may not have a normal bowel movement for a few days.  Please Note:  You might notice some irritation and congestion in your nose or some drainage.  This is from the oxygen used during your procedure.  There is no need for concern and it should clear up in a day or so.  SYMPTOMS TO REPORT IMMEDIATELY:   Following lower endoscopy (colonoscopy or flexible sigmoidoscopy):  Excessive amounts of blood in the stool  Significant tenderness or worsening of abdominal pains  Swelling of the abdomen that is new, acute  Fever of 100F or higher   Following upper endoscopy (EGD)  Vomiting of blood or coffee ground material  New chest pain or pain under the shoulder blades  Painful or persistently difficult swallowing  New shortness of breath  Fever of 100F or higher  Black, tarry-looking stools  For urgent or emergent issues, a gastroenterologist can  be reached at any hour by calling [phone]. Do not use MyChart messaging for urgent concerns.    DIET:  We do recommend a small meal at first, but then you may proceed to your regular diet.  Drink plenty of fluids but you should avoid alcoholic beverages for 24 hours.  ACTIVITY:  You should plan to take it easy for the rest of today and you should NOT DRIVE or use heavy machinery until tomorrow (because of the sedation medicines used during the test).    FOLLOW UP: Our staff will call the number listed on your records 48-72 hours following your procedure to check on you and address any questions or concerns that you may have regarding the information given to you following your procedure. If we do not reach you, we will leave a message.  We will attempt to reach you two times.  During this call, we will ask if you have developed any symptoms of COVID 19. If you develop any symptoms (ie: fever, flu-like symptoms, shortness of breath, cough etc.) before then, please call [phone].  If you test positive for Covid 19 in the 2 weeks post procedure, please call and report this information to [place].    If any biopsies were taken you will be contacted by phone or by letter within the next 1-3 weeks.  Please call us at [phone] if you have not heard about the biopsies in 3 weeks.    SIGNATURES/CONFIDENTIALITY: You and/or your care  partner have signed paperwork which will be entered into your electronic medical record.  These signatures attest to the fact that that the information above on your After Visit Summary has been reviewed and is understood.  Full responsibility of the confidentiality of this discharge information lies with you and/or your care-partner.

## 2019-06-15 NOTE — Progress Notes (Signed)
Called to room to assist during endoscopic procedure.  Patient ID and intended procedure confirmed with present staff. Received instructions for my participation in the procedure from the performing physician.  

## 2019-06-15 NOTE — Progress Notes (Signed)
REFERRING PROVIDER: Self-referred  PRIMARY PROVIDER:  Leamon Arnt, MD  PRIMARY REASON FOR VISIT:  1. Breast neoplasm, Tis (LCIS), left      HISTORY OF PRESENT ILLNESS:   Sarah [name], a 49 y.o. female, was seen for a [place] cancer genetics consultation due to her personal history of LCIS.  Sarah [name] presents to clinic today to discuss the possibility of a hereditary predisposition to cancer, genetic testing, and to further clarify her future cancer risks, as well as potential cancer risks for family members.   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  Patient also notes she has been on immunosuppressants and been through fertility treatments 6 times. Patient notes she is considering having bilateral mastectomies.   CANCER HISTORY:  Oncology History   No history exists.     RISK FACTORS:  Menarche was at age 65.  Ovaries intact: yes.  Hysterectomy: no.  Colonoscopy: yes; normal. Mammogram within the last year: yes. Number of breast biopsies: 1. Up to date with pelvic exams: yes. Any excessive radiation exposure in the past: no  Past Medical History:  Diagnosis Date  . ANA positive    ?Sjogrens  . Breast neoplasm, Tis (LCIS), left   . Cholecystitis   . Cholelithiasis   . Dry eye   . Gall stones   . GERD (gastroesophageal reflux disease)   . Hypothyroidism    Takes synthroid  . Infertility, female   . Keratitis 2020  . Lactose intolerance   . Leukopenia   . Sjogren's disease ([place])   . Thrombocytopenia ([place])   . Urticaria   . Urticaria    episodes since childhood   . Vitamin D deficiency disease    Takes Vit D  . Weight loss     Past Surgical History:  Procedure Laterality Date  . ANTERIOR CRUCIATE LIGAMENT REPAIR Left ~ 2005  . CHOLECYSTECTOMY N/A 07/13/2014   Procedure: LAPAROSCOPIC CHOLECYSTECTOMY WITH INTRAOPERATIVE CHOLANGIOGRAM;  Surgeon: Rolm Bookbinder, MD;  Location: [facility];  Service: General;  Laterality: N/A;  . ENDOMETRIAL BIOPSY    . FERTILITY SURGERY  ~ 2008; ~ 2011; ~ 2012  . LAPAROSCOPIC CHOLECYSTECTOMY  07/13/2014  . WISDOM TOOTH EXTRACTION      Social History   Socioeconomic History  . Marital status: Married    Spouse name: Not on file  . Number of children: 0  . Years of education: Not on file  . Highest education level: Not on file  Occupational History  . Occupation: internal medicine physician/ hospitalist    Employer: [employer]  Tobacco Use  . Smoking status: Never Smoker  . Smokeless tobacco: Never Used  Substance and Sexual Activity  . Alcohol use: No    Alcohol/week: 0.0 standard drinks  . Drug use: No  . Sexual activity: Yes    Birth control/protection: Other-see comments    Comment: infertile  Other Topics Concern  . Not on file  Social History Narrative   Married   Social Determinants of Health   Financial Resource Strain:   . Difficulty of Paying Living Expenses:   Food Insecurity:   . Worried About Charity fundraiser in the Last Year:   . Arboriculturist in the Last Year:   Transportation Needs:   .  Lack of Transportation (Medical):   Marland Kitchen Lack of Transportation (Non-Medical):   Physical Activity:   . Days of Exercise per Week:   . Minutes of Exercise per Session:   Stress:   . Feeling of Stress :   Social Connections:   . Frequency of Communication with Friends and Family:   . Frequency of Social Gatherings with Friends and Family:   . Attends Religious Services:   . Active Member of Clubs or Organizations:   . Attends Archivist Meetings:   Marland Kitchen Marital Status:      FAMILY HISTORY:  We obtained a detailed, 4-generation family history.  Significant diagnoses are listed below: Family History  Problem Relation Age of Onset  . Lupus Sister   . Colon cancer Neg Hx   .  Stomach cancer Neg Hx   . Pancreatic cancer Neg Hx   . Esophageal cancer Neg Hx    Sarah [name] does not have children. She has 1 sister, 60, who has lupus and also does not have children.  Sarah [name] mother is living at 59. Patient has 2 maternal aunts, no cancers. No known cancers in maternal first cousins. Possible cousin with triple negative breast cancer at 72, patient is unsure if this individual is related to her but does believe she is a distant cousin. Maternal grandparents passed in their 65s.  Sarah [name] father is living at 26, no cancers. Patient has 2 paternal uncles, no cancers. No known cancers in paternal cousins. Paternal grandmother died in an accident. Grandfather passed in his 15s.  Sarah [name] is unaware of previous family history of genetic testing for hereditary cancer risks. Patient's maternal ancestors are of [place] descent, and paternal ancestors are of [place] descent. There is no reported Ashkenazi Jewish ancestry. There is no known consanguinity.  GENETIC COUNSELING ASSESSMENT: Sarah [name] is a 49 y.o. female with a personal history of LCIS which is not particularly suggestive of a hereditary cancer syndrome, however Sarah [name] was very interested in pursuing testing. We, therefore, discussed and recommended the following at today's visit.   DISCUSSION: We discussed that 5 - 10% of breast cancer is hereditary, with most cases associated with BRCA1/BRCA2 mutations.  There are other genes that can be associated with hereditary breast cancer syndromes.  These include CHEK2, ATM, PALB2.   We discussed that testing is beneficial for several reasons including surgical decision-making for breast cancer, knowing how to follow individuals after completing their treatment, and understand if other family members could be at risk for cancer and allow them to undergo genetic testing.   We reviewed the characteristics, features and inheritance patterns of hereditary cancer syndromes. We also discussed  genetic testing, including the appropriate family members to test, the process of testing, insurance coverage and turn-around-time for results. We discussed the implications of a negative, positive and/or variant of uncertain significant result. In order to get genetic test results in a timely manner so that Sarah [name] can use these genetic test results for surgical decisions, we recommended Sarah [name] pursue genetic testing for the [facility] Breast Cancer STAT Panel. Once complete, we recommend Sarah [name] pursue reflex genetic testing to the Multi-Cancer gene panel.   The STAT Breast cancer panel offered by [facility] includes sequencing and rearrangement analysis for the following 9 genes:  ATM, BRCA1, BRCA2, CDH1, CHEK2, PALB2, PTEN, STK11 and TP53.    The Multi-Cancer Panel offered by [facility] includes sequencing and/or deletion duplication testing of the following 85 genes: AIP, ALK, APC,  ATM, AXIN2,BAP1,  BARD1, BLM, BMPR1A, BRCA1, BRCA2, BRIP1, CASR, CDC73, CDH1, CDK4, CDKN1B, CDKN1C, CDKN2A (p14ARF), CDKN2A (p16INK4a), CEBPA, CHEK2, CTNNA1, DICER1, DIS3L2, EGFR (c.2369C>T, p.Thr790Met variant only), EPCAM (Deletion/duplication testing only), FH, FLCN, GATA2, GPC3, GREM1 (Promoter region deletion/duplication testing only), HOXB13 (c.251G>A, p.Gly84Glu), HRAS, KIT, MAX, MEN1, MET, MITF (c.952G>A, p.Glu318Lys variant only), MLH1, MSH2, MSH3, MSH6, MUTYH, NBN, NF1, NF2, NTHL1, PALB2, PDGFRA, PHOX2B, PMS2, POLD1, POLE, POT1, PRKAR1A, PTCH1, PTEN, RAD50, RAD51C, RAD51D, RB1, RECQL4, RET, RNF43, RUNX1, SDHAF2, SDHA (sequence changes only), SDHB, SDHC, SDHD, SMAD4, SMARCA4, SMARCB1, SMARCE1, STK11, SUFU, TERC, TERT, TMEM127, TP53, TSC1, TSC2, VHL, WRN and WT1.   Based on Sarah [name]'s personal history of LCIS and no family history of cancer, she does not meet medical criteria for genetic testing and will pay the patient pay price of $250.   PLAN: After considering the risks, benefits, and limitations, Sarah [name] provided informed  consent to pursue genetic testing and the blood sample was sent to [facility] for analysis of the Breast Cancer STAT Panel + Multi-Cancer Panel. Results should be available within approximately 1 weeks' time, at which point they will be disclosed by telephone to Sarah [name], as will any additional recommendations warranted by these results. Sarah [name] will receive a summary of her genetic counseling visit and a copy of her results once available. This information will also be available in Epic.   Sarah [name] questions were answered to her satisfaction today. Our contact information was provided should additional questions or concerns arise. Thank you for the referral and allowing [place] to share in the care of your patient.   Faith Rogue, Sarah [name], [facility] Genetic Counselor [email or web address] Phone: [phone]  The patient was seen for a total of 30 minutes in face-to-face genetic counseling. Patient's husband was also present.  Drs. Magrinat, Lindi Adie and/or Burr Medico were available for discussion regarding this case.   _______________________________________________________________________ For Office Staff:  Number of people involved in session: 2 Was an Intern/ student involved with case: no]

## 2019-06-15 NOTE — Op Note (Signed)
[facility] Patient Name: Sarah [name] Procedure Date: 06/15/2019 10:35 AM MRN: [id number] Endoscopist: Jerene Bears , MD Age: 49 Referring MD:  Date of Birth: 06/05/1970 Gender: Female Account #: [id number] Procedure:                Colonoscopy Indications:              Screening for colorectal malignant neoplasm, This                            is the patient's first colonoscopy Medicines:                Monitored Anesthesia Care Procedure:                Pre-Anesthesia Assessment:                           - Prior to the procedure, a History and Physical                            was performed, and patient medications and                            allergies were reviewed. The patient's tolerance of                            previous anesthesia was also reviewed. The risks                            and benefits of the procedure and the sedation                            options and risks were discussed with the patient.                            All questions were answered, and informed consent                            was obtained. Prior Anticoagulants: The patient has                            taken no previous anticoagulant or antiplatelet                            agents. ASA Grade Assessment: II - A patient with                            mild systemic disease. After reviewing the risks                            and benefits, the patient was deemed in                            satisfactory condition to undergo the procedure.  After obtaining informed consent, the colonoscope                            was passed under direct vision. Throughout the                            procedure, the patient's blood pressure, pulse, and                            oxygen saturations were monitored continuously. The                            Colonoscope was introduced through the anus and                            advanced to the terminal ileum.  The colonoscopy was                            performed without difficulty. The patient tolerated                            the procedure well. The quality of the bowel                            preparation was excellent. The terminal ileum,                            ileocecal valve, appendiceal orifice, and rectum                            were photographed. Scope In: 10:58:41 AM Scope Out: 11:12:17 AM Scope Withdrawal Time: 0 hours 8 minutes 40 seconds  Total Procedure Duration: 0 hours 13 minutes 36 seconds  Findings:                 The digital rectal exam was normal.                           The terminal ileum appeared normal.                           The entire examined colon appeared normal on direct                            and retroflexion views. Complications:            No immediate complications. Estimated Blood Loss:     Estimated blood loss: none. Impression:               - The examined portion of the ileum was normal.                           - The entire examined colon is normal on direct and                            retroflexion  views.                           - No specimens collected. Recommendation:           - Patient has a contact number available for                            emergencies. The signs and symptoms of potential                            delayed complications were discussed with the                            patient. Return to normal activities tomorrow.                            Written discharge instructions were provided to the                            patient.                           - Resume previous diet.                           - Continue present medications.                           - Repeat colonoscopy in 10 years for screening                            purposes. Jerene Bears, MD 06/15/2019 11:15:16 AM This report has been signed electronically.

## 2019-06-15 NOTE — Op Note (Signed)
[facility] Patient Name: Sarah [name] Procedure Date: 06/15/2019 10:36 AM MRN: [id number] Endoscopist: Jerene Bears , MD Age: 49 Referring MD:  Date of Birth: 09/29/1970 Gender: Female Account #: [id number] Procedure:                Upper GI endoscopy Indications:              Gastro-esophageal reflux disease, Weight loss Medicines:                Monitored Anesthesia Care Procedure:                Pre-Anesthesia Assessment:                           - Prior to the procedure, a History and Physical                            was performed, and patient medications and                            allergies were reviewed. The patient's tolerance of                            previous anesthesia was also reviewed. The risks                            and benefits of the procedure and the sedation                            options and risks were discussed with the patient.                            All questions were answered, and informed consent                            was obtained. Prior Anticoagulants: The patient has                            taken no previous anticoagulant or antiplatelet                            agents. ASA Grade Assessment: II - A patient with                            mild systemic disease. After reviewing the risks                            and benefits, the patient was deemed in                            satisfactory condition to undergo the procedure.                           After obtaining informed consent, the endoscope was  passed under direct vision. Throughout the                            procedure, the patient's blood pressure, pulse, and                            oxygen saturations were monitored continuously. The                            Endoscope was introduced through the mouth, and                            advanced to the second part of duodenum. The upper                            GI endoscopy was  accomplished without difficulty.                            The patient tolerated the procedure well. Scope In: Scope Out: Findings:                 The examined esophagus was normal. Z-line is                            regular at 40 cm from the incisors.                           Mild to moderate inflammation characterized by                            erythema was found in the cardia, in the gastric                            fundus and in the gastric body. Biopsies were taken                            with a cold forceps for histology and Helicobacter                            pylori testing.                           The incisura and gastric antrum were normal.                           The examined duodenum was normal. Biopsies were                            taken with a cold forceps for histology. Complications:            No immediate complications. Estimated Blood Loss:     Estimated blood loss was minimal. Impression:               - Normal esophagus.                           -  Gastritis. Biopsied.                           - Normal examined duodenum. Biopsied. Recommendation:           - Patient has a contact number available for                            emergencies. The signs and symptoms of potential                            delayed complications were discussed with the                            patient. Return to normal activities tomorrow.                            Written discharge instructions were provided to the                            patient.                           - Resume previous diet.                           - Continue present medications.                           - Await pathology results. Jerene Bears, MD 06/15/2019 11:22:27 AM This report has been signed electronically.

## 2019-06-15 NOTE — Progress Notes (Signed)
Temp by LS. VS by Martin Majestic

## 2019-06-15 NOTE — Progress Notes (Signed)
pt tolerated well. VSS. awake and to recovery. Report given to RN.  

## 2019-06-17 ENCOUNTER — Telehealth: Payer: Self-pay | Admitting: *Deleted

## 2019-06-17 ENCOUNTER — Telehealth: Payer: Self-pay

## 2019-06-17 ENCOUNTER — Other Ambulatory Visit: Payer: Self-pay

## 2019-06-17 MED ORDER — PANTOPRAZOLE SODIUM 40 MG PO TBEC
40.0000 mg | DELAYED_RELEASE_TABLET | Freq: Two times a day (BID) | ORAL | 0 refills | Status: DC
Start: 2019-06-17 — End: 2019-07-16

## 2019-06-17 MED ORDER — BIS SUBCIT-METRONID-TETRACYC 140-125-125 MG PO CAPS: 3.0000 | ORAL_CAPSULE | Freq: Three times a day (TID) | ORAL | 0 refills | Status: DC

## 2019-06-17 MED FILL — PANTOPRAZOLE SOD DR 40 MG T: 40 | 10 days supply | Qty: 20 | Fill #0

## 2019-06-17 MED FILL — predniSONE 1 MG TABS: 1 | 30 days supply | Qty: 120 | Fill #0

## 2019-06-17 MED FILL — HYDROXYCHLOROQUINE SULFATE: 200 | 30 days supply | Qty: 45 | Fill #1

## 2019-06-17 MED FILL — PYLERA CAPSULE: 140-125-125 | 10 days supply | Qty: 120 | Fill #0

## 2019-06-17 NOTE — Telephone Encounter (Signed)
Second follow up call attempt.  Message left now and earlier this morning.

## 2019-06-17 NOTE — Telephone Encounter (Signed)
No answer, left message to call back later today, B.Mayanna Garlitz RN. 

## 2019-06-18 DIAGNOSIS — D0502 Lobular carcinoma in situ of left breast: Secondary | ICD-10-CM | POA: Diagnosis not present

## 2019-06-19 ENCOUNTER — Ambulatory Visit
Admission: RE | Admit: 2019-06-19 | Discharge: 2019-06-19 | Disposition: A | Discharge status: Home / Self Care | Payer: 59 | Source: Ambulatory Visit | Attending: Family Medicine | Admitting: Family Medicine

## 2019-06-19 ENCOUNTER — Other Ambulatory Visit: Payer: Self-pay | Admitting: Diagnostic Radiology

## 2019-06-19 ENCOUNTER — Other Ambulatory Visit: Payer: Self-pay | Admitting: General Surgery

## 2019-06-19 ENCOUNTER — Other Ambulatory Visit: Payer: Self-pay

## 2019-06-19 DIAGNOSIS — R928 Other abnormal and inconclusive findings on diagnostic imaging of breast: Secondary | ICD-10-CM

## 2019-06-19 DIAGNOSIS — D0511 Intraductal carcinoma in situ of right breast: Secondary | ICD-10-CM | POA: Diagnosis not present

## 2019-06-19 DIAGNOSIS — R634 Abnormal weight loss: Secondary | ICD-10-CM

## 2019-06-19 DIAGNOSIS — N6011 Diffuse cystic mastopathy of right breast: Secondary | ICD-10-CM | POA: Diagnosis not present

## 2019-06-19 DIAGNOSIS — N6012 Diffuse cystic mastopathy of left breast: Secondary | ICD-10-CM | POA: Diagnosis not present

## 2019-06-19 DIAGNOSIS — D0512 Intraductal carcinoma in situ of left breast: Secondary | ICD-10-CM | POA: Diagnosis not present

## 2019-06-19 MED ORDER — GADOBUTROL 1 MMOL/ML IV SOLN
5.0000 mL | Freq: Once | INTRAVENOUS | Status: AC | PRN
  Administered 2019-06-19: 5 mL via INTRAVENOUS

## 2019-06-22 ENCOUNTER — Ambulatory Visit
Admission: RE | Admit: 2019-06-22 | Discharge: 2019-06-22 | Disposition: A | Discharge status: Home / Self Care | Payer: 59 | Source: Ambulatory Visit | Attending: General Surgery | Admitting: General Surgery

## 2019-06-22 ENCOUNTER — Ambulatory Visit
Admission: RE | Admit: 2019-06-22 | Discharge: 2019-06-22 | Disposition: A | Discharge status: Home / Self Care | Payer: Self-pay | Source: Ambulatory Visit | Attending: General Surgery | Admitting: General Surgery

## 2019-06-22 ENCOUNTER — Other Ambulatory Visit: Payer: Self-pay

## 2019-06-22 ENCOUNTER — Other Ambulatory Visit: Payer: Self-pay | Admitting: General Surgery

## 2019-06-22 ENCOUNTER — Other Ambulatory Visit: Payer: 59

## 2019-06-22 DIAGNOSIS — R634 Abnormal weight loss: Secondary | ICD-10-CM

## 2019-06-22 DIAGNOSIS — C50912 Malignant neoplasm of unspecified site of left female breast: Secondary | ICD-10-CM | POA: Diagnosis not present

## 2019-06-22 DIAGNOSIS — D259 Leiomyoma of uterus, unspecified: Secondary | ICD-10-CM | POA: Diagnosis not present

## 2019-06-22 MED ORDER — IOPAMIDOL (ISOVUE-300) INJECTION 61%
100.0000 mL | Freq: Once | INTRAVENOUS | Status: AC | PRN
  Administered 2019-06-22: 100 mL via INTRAVENOUS

## 2019-06-23 ENCOUNTER — Other Ambulatory Visit: Payer: 59

## 2019-06-23 DIAGNOSIS — D0502 Lobular carcinoma in situ of left breast: Secondary | ICD-10-CM | POA: Diagnosis not present

## 2019-06-24 ENCOUNTER — Other Ambulatory Visit: Payer: 59

## 2019-06-25 ENCOUNTER — Telehealth: Payer: Self-pay | Admitting: Licensed Clinical Social Worker

## 2019-06-25 NOTE — Telephone Encounter (Signed)
Revealed negative genetic testing on the [facility] Breast Cancer STAT Panel. Discussed that we will call her back when the remainder of testing is complete.

## 2019-06-26 ENCOUNTER — Other Ambulatory Visit: Payer: Self-pay | Admitting: Oncology

## 2019-07-01 ENCOUNTER — Encounter: Payer: Self-pay | Admitting: Licensed Clinical Social Worker

## 2019-07-01 ENCOUNTER — Other Ambulatory Visit: Payer: Self-pay

## 2019-07-01 ENCOUNTER — Ambulatory Visit: Payer: Self-pay | Admitting: Licensed Clinical Social Worker

## 2019-07-01 ENCOUNTER — Telehealth: Payer: Self-pay | Admitting: Licensed Clinical Social Worker

## 2019-07-01 DIAGNOSIS — D0502 Lobular carcinoma in situ of left breast: Secondary | ICD-10-CM

## 2019-07-01 DIAGNOSIS — Z1379 Encounter for other screening for genetic and chromosomal anomalies: Secondary | ICD-10-CM

## 2019-07-01 NOTE — Progress Notes (Signed)
HPI:  Sarah [name] was previously seen in the [place] clinic due to her personal history of LCIS, unknown/limited family history and concerns regarding a hereditary predisposition to cancer. Please refer to our prior cancer genetics clinic note for more information regarding our discussion, assessment and recommendations, at the time. Sarah [name] recent genetic test results were disclosed to her, as were recommendations warranted by these results. These results and recommendations are discussed in more detail below.  CANCER HISTORY:  Oncology History   No history exists.    FAMILY HISTORY:  We obtained a detailed, 4-generation family history.  Significant diagnoses are listed below: Family History  Problem Relation Age of Onset  . Lupus Sister   . Colon cancer Neg Hx   . Stomach cancer Neg Hx   . Pancreatic cancer Neg Hx   . Esophageal cancer Neg Hx     Sarah [name] does not have children. She has 1 sister, 47, who has lupus and also does not have children.  Sarah [name] mother is living at 25. Patient has 2 maternal aunts, no cancers. No known cancers in maternal first cousins. Possible cousin with triple negative breast cancer at 20, patient is unsure if this individual is related to her but does believe she is a distant cousin. Maternal grandparents passed in their 80s.  Sarah [name] father is living at 35, no cancers. Patient has 2 paternal uncles, no cancers. No known cancers in paternal cousins. Paternal grandmother died in an accident. Grandfather passed in his 26s.  Sarah [name] is unaware of previous family history of genetic testing for hereditary cancer risks. Patient's maternal ancestors are of [place] descent, and paternal ancestors are of [place] descent. There is no reported Ashkenazi Jewish ancestry. There is no known consanguinity.   GENETIC TEST RESULTS: Genetic testing reported out on 07/01/2019 through the [facility] Breast [facility] STAT Panel + Multi- cancer panel found no  pathogenic mutations.   The STAT Breast cancer panel offered by [facility] includes sequencing and rearrangement analysis for the following 9 genes:  ATM, BRCA1, BRCA2, CDH1, CHEK2, PALB2, PTEN, STK11 and TP53.    The Multi-Cancer Panel offered by [facility] includes sequencing and/or deletion duplication testing of the following 85 genes: AIP, ALK, APC, ATM, AXIN2,BAP1,  BARD1, BLM, BMPR1A, BRCA1, BRCA2, BRIP1, CASR, CDC73, CDH1, CDK4, CDKN1B, CDKN1C, CDKN2A (p14ARF), CDKN2A (p16INK4a), CEBPA, CHEK2, CTNNA1, DICER1, DIS3L2, EGFR (c.2369C>T, p.Thr790Met variant only), EPCAM (Deletion/duplication testing only), FH, FLCN, GATA2, GPC3, GREM1 (Promoter region deletion/duplication testing only), HOXB13 (c.251G>A, p.Gly84Glu), HRAS, KIT, MAX, MEN1, MET, MITF (c.952G>A, p.Glu318Lys variant only), MLH1, MSH2, MSH3, MSH6, MUTYH, NBN, NF1, NF2, NTHL1, PALB2, PDGFRA, PHOX2B, PMS2, POLD1, POLE, POT1, PRKAR1A, PTCH1, PTEN, RAD50, RAD51C, RAD51D, RB1, RECQL4, RET, RNF43, RUNX1, SDHAF2, SDHA (sequence changes only), SDHB, SDHC, SDHD, SMAD4, SMARCA4, SMARCB1, SMARCE1, STK11, SUFU, TERC, TERT, TMEM127, TP53, TSC1, TSC2, VHL, WRN and WT1.   The test report has been scanned into EPIC and is located under the Molecular Pathology section of the Results Review tab.  A portion of the result report is included below for reference.      We discussed with Sarah [name] that because current genetic testing is not perfect, it is possible there may be a gene mutation in one of these genes that current testing cannot detect, but that chance is small.  We also discussed, that there could be another gene that has not yet been discovered, or that we have not yet tested, that is responsible for the cancer diagnoses  in the family. It is also possible there is a hereditary cause for the cancer in the family that Sarah [name] did not inherit and therefore was not identified in her testing.  Therefore, it is important to remain in touch with cancer genetics in  the future so that we can continue to offer Sarah [name] the most up to date genetic testing.   Genetic testing did identify a variant of uncertain significance (VUS) was identified in the POLE gene called c.2113C>T.  At this time, it is unknown if this variant is associated with increased cancer risk or if this is a normal finding, but most variants such as this get reclassified to being inconsequential. It should not be used to make medical management decisions. With time, we suspect the lab will determine the significance of this variant, if any. If we do learn more about it, we will try to contact Sarah [name] to discuss it further. However, it is important to stay in touch with [place] periodically and keep the address and phone number up to date.  ADDITIONAL GENETIC TESTING: We discussed with Sarah [name] that her genetic testing was fairly extensive.  If there are genes identified to increase cancer risk that can be analyzed in the future, we would be happy to discuss and coordinate this testing at that time.    CANCER SCREENING RECOMMENDATIONS: Sarah [name] test result is considered negative (normal).  This means that we have not identified a hereditary cause for her personal history of LCIS at this time.   While reassuring, this does not definitively rule out a hereditary predisposition to cancer. It is still possible that there could be genetic mutations that are undetectable by current technology. There could be genetic mutations in genes that have not been tested or identified to increase cancer risk.  Therefore, it is recommended she continue to follow the cancer management and screening guidelines provided by her  primary healthcare provider.   An individual's cancer risk and medical management are not determined by genetic test results alone. Overall cancer risk assessment incorporates additional factors, including personal medical history, family history, and any available genetic information that may result in a  personalized plan for cancer prevention and surveillance.  RECOMMENDATIONS FOR FAMILY MEMBERS:  Relatives in this family might be at some increased risk of developing cancer, over the general population risk, simply due to the family history of cancer.  We recommended female relatives in this family have a yearly mammogram beginning at age 11, or 22 years younger than the earliest onset of cancer, an annual clinical breast exam, and perform monthly breast self-exams. Female relatives in this family should also have a gynecological exam as recommended by their primary provider. All family members should have a colonoscopy by age 79, or as directed by their physicians.  FOLLOW-UP: Lastly, we discussed with Sarah [name] that cancer genetics is a rapidly advancing field and it is possible that new genetic tests will be appropriate for her and/or her family members in the future. We encouraged her to remain in contact with cancer genetics on an annual basis so we can update her personal and family histories and let her know of advances in cancer genetics that may benefit this family.   Our contact number was provided. Ms. Reining questions were answered to her satisfaction, and she knows she is welcome to call us at anytime with additional questions or concerns.   Faith Rogue, MS, [facility] Genetic Counselor [email or web address] Phone: [phone]

## 2019-07-01 NOTE — Telephone Encounter (Signed)
Revealed negative genetic testing.  Revealed that a VUS in POLE was identified. This normal result is reassuring. It is unlikely that there is an increased risk of cancer due to a mutation in one of these genes.  However, genetic testing is not perfect, and cannot definitively rule out a hereditary cause.  It will be important for her to keep in contact with genetics to learn if any additional testing may be needed in the future.

## 2019-07-02 ENCOUNTER — Ambulatory Visit (INDEPENDENT_AMBULATORY_CARE_PROVIDER_SITE_OTHER): Payer: 59 | Admitting: Obstetrics & Gynecology

## 2019-07-02 ENCOUNTER — Encounter: Payer: Self-pay | Admitting: Obstetrics & Gynecology

## 2019-07-02 ENCOUNTER — Other Ambulatory Visit: Payer: Self-pay

## 2019-07-02 VITALS — BP 116/68 | Ht 63.25 in | Wt 111.0 lb

## 2019-07-02 DIAGNOSIS — R634 Abnormal weight loss: Secondary | ICD-10-CM

## 2019-07-02 DIAGNOSIS — Z01419 Encounter for gynecological examination (general) (routine) without abnormal findings: Secondary | ICD-10-CM | POA: Diagnosis not present

## 2019-07-02 NOTE — Progress Notes (Signed)
49 y.o. G64P0010 Married [place] female here for annual exam/new patient exam.  She was referred from Dr. Serita Grammes.     Had negative genetic testing that was negative except for one heterogenous variant of unknown significant.    Had colonoscopy due to weight loss.  Has positive h pylori and gastritis.  Is undergoing treatment.    Last MMG 2015.  Then had MMG 2021.  Had new calcified lesion.  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 - FLAT EPITHELIAL ATYPIA WITH CALCIFICATIONS. - SEE MICROSCOPIC DESCRIPTION 2. Breast, left, needle core biopsy, upper outer quadrant middle depth calcifications - ATYPICAL LOBULAR HYPERPLASIA WITH CALCIFICATIONS. - FLAT EPITHELIAL ATYPIA WITH CALCIFICATIONS. - SEE MICROSCOPIC DESCRIPTION.  She sees Dr. Donne Hazel later today to discuss plan.     Saw Dr. Iran Planas 06/23/2019.  Reports Dr. Iran Planas is concerned about the pt having implants due to hx of auto-immune d/o.  Pt is considering just having mastectomy, bilaterally, and being done.    Menstrual cycles are regular.  Flow lasts 3-4 days.  Did have IVF for 6 cycles.  Had one miscarriage.  Was in her late 17's.  Does not feel she needs contraception.  Patient's last menstrual period was 06/27/2019 (exact date).          Sexually active: Yes.    The current method of family planning is none.    Exercising: Yes.    walking Smoker:  no  Health Maintenance: Pap:  [id number] neg HPV HR neg  History of abnormal Pap:  no MMG:  Left breast cancer see reports Colonoscopy:  06-15-2019 f/u 81yrs per patient BMD:   Not done TDaP:  2020 Pneumonia vaccine(s):  Not done Shingrix:   Not done Hep C testing: patient believes she had it tested before Screening Labs: hep c obtained today   reports that she has never smoked. She has never used smokeless tobacco. She reports that she does not drink alcohol or use drugs.  Past Medical History:  Diagnosis Date  . ANA positive    ?Sjogrens  .  Breast neoplasm, Tis (LCIS), left   . Cholecystitis   . Cholelithiasis   . Dry eye   . Fibroid   . Gall stones   . GERD (gastroesophageal reflux disease)   . Hypothyroidism    Takes synthroid  . Infertility, female   . Keratitis 2020  . Lactose intolerance   . Leukopenia   . Sjogren's disease ([place])   . Thrombocytopenia ([place])   . Urticaria    episodes since childhood   . Vitamin D deficiency disease    Takes Vit D  . Weight loss     Past Surgical History:  Procedure Laterality Date  . CHOLECYSTECTOMY N/A 07/13/2014   Procedure: LAPAROSCOPIC CHOLECYSTECTOMY WITH INTRAOPERATIVE CHOLANGIOGRAM;  Surgeon: Rolm Bookbinder, MD;  Location: [facility];  Service: General;  Laterality: N/A;  . egg retrieval  ~ 2008; ~ 2011; ~ 2012  . ENDOMETRIAL BIOPSY    . WISDOM TOOTH EXTRACTION      Current Outpatient Medications  Medication Sig Dispense Refill  . Ascorbic Acid (VITAMIN C) 100 MG CHEW Chew 1 tablet by mouth as needed.     . bismuth-metronidazole-tetracycline (PYLERA) 140-125-125 MG capsule Take 3 capsules by mouth 4 (four) times daily -  before meals and at bedtime. 120 capsule 0  . cycloSPORINE, PF, (CEQUA) 0.09 % SOLN APLLY 1 DROP TO EYE 2 TIMES DAILY    . hydroxychloroquine (PLAQUENIL) 200 MG tablet Take 200  mg by mouth daily.    . pantoprazole (PROTONIX) 40 MG tablet Take 1 tablet (40 mg total) by mouth 2 (two) times daily. 20 tablet 0  . Polyethyl Glycol-Propyl Glycol 0.4-0.3 % SOLN Apply to eye.    . predniSONE 2 MG TBEC Take 1 tablet by mouth daily.     Marland Kitchen UNABLE TO FIND Platelet rich growth factor eye drops     No current facility-administered medications for this visit.    Family History  Problem Relation Age of Onset  . Lupus Sister   . Thyroid disease Sister   . Thyroid disease Mother     Review of Systems  Constitutional: Negative.   HENT: Negative.   Eyes: Negative.   Respiratory: Negative.   Cardiovascular: Negative.   Gastrointestinal: Negative.    Endocrine: Negative.   Genitourinary: Negative.   Musculoskeletal: Negative.   Skin: Negative.   Allergic/Immunologic: Negative.   Neurological: Negative.   Hematological: Negative.   Psychiatric/Behavioral: Negative.     Exam:   Ht 5' 3.25" (1.607 m)   Wt 111 lb (50.3 kg)   LMP 06/27/2019 (Exact Date)   BMI 19.51 kg/m   Height: 5' 3.25" (160.7 cm)  General appearance: alert, cooperative and appears stated age Head: Normocephalic, without obvious abnormality, atraumatic Neck: no adenopathy, supple, symmetrical, trachea midline and thyroid normal to inspection and palpation Lungs: clear to auscultation bilaterally Breasts: bruising and tenderness from recent biopsies, no discrete masses or LAD noted today Heart: regular rate and rhythm Abdomen: soft, non-tender; bowel sounds normal; no masses,  no organomegaly Extremities: extremities normal, atraumatic, no cyanosis or edema Skin: Skin color, texture, turgor normal. No rashes or lesions Lymph nodes: Cervical, supraclavicular, and axillary nodes normal. No abnormal inguinal nodes palpated Neurologic: Grossly normal   Pelvic: External genitalia:  no lesions              Urethra:  normal appearing urethra with no masses, tenderness or lesions              Bartholins and Skenes: normal                 Vagina: normal appearing vagina with normal color and discharge, no lesions              Cervix: no lesions              Pap taken: No. Bimanual Exam:  Uterus:  enlarged, 10-12 weeks size, globular and enlarged posteriorly              Adnexa: no mass, fullness, tenderness               Rectovaginal: Confirms               Anus:  normal sphincter tone, no lesions  Chaperone, Terence Lux, CMA, was present for exam.  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  Not indicated today.   Hep C antibody obtained today Will see Dr. Donne Hazel for discussion of treatment plan Will follow up with GI regarding GERD/gastritis/weight loss return annually or prn

## 2019-07-03 DIAGNOSIS — D0500 Lobular carcinoma in situ of unspecified breast: Secondary | ICD-10-CM | POA: Diagnosis not present

## 2019-07-03 LAB — HEPATITIS C ANTIBODY: Hep C Virus Ab: <0.1 s/co ratio (ref 0.0–0.9)

## 2019-07-06 ENCOUNTER — Encounter: Payer: Self-pay | Admitting: Obstetrics & Gynecology

## 2019-07-08 ENCOUNTER — Other Ambulatory Visit: Payer: Self-pay | Admitting: General Surgery

## 2019-07-08 ENCOUNTER — Telehealth: Payer: Self-pay | Admitting: Family Medicine

## 2019-07-08 DIAGNOSIS — D0502 Lobular carcinoma in situ of left breast: Secondary | ICD-10-CM

## 2019-07-08 NOTE — Telephone Encounter (Signed)
OK to schedule in virtual slot

## 2019-07-08 NOTE — Telephone Encounter (Signed)
Called patient to reschedule appointment for 07/28/19 physical, next available is not until November, patient states that insurance is needing a physical before July, could we schedule in virtual for 07/28/19?

## 2019-07-10 ENCOUNTER — Other Ambulatory Visit: Payer: Self-pay

## 2019-07-10 ENCOUNTER — Encounter: Payer: Self-pay | Admitting: Physical Therapy

## 2019-07-10 ENCOUNTER — Ambulatory Visit: Payer: 59 | Attending: General Surgery | Admitting: Physical Therapy

## 2019-07-10 DIAGNOSIS — D0502 Lobular carcinoma in situ of left breast: Secondary | ICD-10-CM | POA: Insufficient documentation

## 2019-07-10 DIAGNOSIS — R293 Abnormal posture: Secondary | ICD-10-CM | POA: Insufficient documentation

## 2019-07-10 NOTE — Therapy (Signed)
[place], [place], [address] Phone: [phone]   Fax:  [phone]  Physical Therapy Evaluation  Patient Details  Name: Sarah [name] MRN: [id number] Date of Birth: 03-13-1970 Referring Provider (PT): Donne Hazel   Encounter Date: 07/10/2019   PT End of Session - 07/10/19 0917    Visit Number 1    Number of Visits 2    Date for PT Re-Evaluation 09/04/19    PT Start Time 0806    PT Stop Time 0845    PT Time Calculation (min) 39 min    Activity Tolerance Patient tolerated treatment well    Behavior During Therapy [facility] for tasks assessed/performed           Past Medical History:  Diagnosis Date  . ANA positive    ?Sjogrens  . Breast neoplasm, Tis (LCIS), left   . Cholecystitis   . Cholelithiasis   . Dry eye   . Fibroid   . Gall stones   . GERD (gastroesophageal reflux disease)   . Hypothyroidism    Takes synthroid  . Infertility, female   . Keratitis 2020  . Lactose intolerance   . Leukopenia   . Sjogren's disease ([place])   . Thrombocytopenia ([place])   . Urticaria    episodes since childhood   . Vitamin D deficiency disease    Takes Vit D  . Weight loss     Past Surgical History:  Procedure Laterality Date  . CHOLECYSTECTOMY N/A 07/13/2014   Procedure: LAPAROSCOPIC CHOLECYSTECTOMY WITH INTRAOPERATIVE CHOLANGIOGRAM;  Surgeon: Rolm Bookbinder, MD;  Location: [place];  Service: General;  Laterality: N/A;  . egg retrieval  2008; 2011; 2012   multiple IVF cycles  . ENDOMETRIAL BIOPSY    . WISDOM TOOTH EXTRACTION      There were no vitals filed for this visit.    Subjective Assessment - 07/10/19 0809    Subjective Pt reports to PT today for baseline measurements for newly diagnosed L breast cancer.    Pertinent History L breast neoplasm LCIS, 07/15/19- will undergo bilateral mastectomy with L SLND, sjogrens disease/lupus    Patient Stated Goals reduce lymphedema risk and learn post op  shoulder ex    Currently in Pain? Yes    Pain Score 2     Pain Location Shoulder    Pain Orientation Left;Right    Pain Descriptors / Indicators Aching;Tightness    Pain Type Chronic pain    Pain Onset More than a month ago    Pain Frequency Constant    Aggravating Factors  pt is unsure    Pain Relieving Factors massage    Effect of Pain on Daily Activities no effect              [facility] PT Assessment - 07/10/19 0001      Assessment   Medical Diagnosis L breast cancer    Referring Provider (PT) Donne Hazel    Onset Date/Surgical Date 07/15/19    Hand Dominance Right;Left    Prior Therapy 2019 and 2011 for upper shoulder and back pain that is chronic      Precautions   Precautions Other (comment)    Precaution Comments active cancer      Restrictions   Weight Bearing Restrictions No      Balance Screen   Has the patient fallen in the past 6 months No    Has the patient had a decrease in activity level because of a fear of falling?  No    Is the patient reluctant to leave their home because of a fear of falling?  No      Home Environment   Living Environment Private residence    Living Arrangements Spouse/significant other    Available Help at Discharge Family    Type of [place]      Prior Function   Level of [place] Full time employment    Vocation Requirements hospitalist- physician    Leisure pt does not currently exercise      Cognition   Overall Cognitive Status Within Functional Limits for tasks assessed      Posture/Postural Control   Posture/Postural Control Postural limitations    Postural Limitations Rounded Shoulders;Forward head      ROM / Strength   AROM / PROM / Strength AROM      AROM   AROM Assessment Site Shoulder    Right/Left Shoulder Right;Left    Right Shoulder Extension 40 Degrees    Right Shoulder Flexion 172 Degrees    Right Shoulder ABduction 180 Degrees    Right Shoulder Internal Rotation 65 Degrees     Right Shoulder External Rotation 90 Degrees    Left Shoulder Extension 39 Degrees    Left Shoulder Flexion 173 Degrees    Left Shoulder ABduction 180 Degrees    Left Shoulder Internal Rotation 78 Degrees    Left Shoulder External Rotation 81 Degrees             LYMPHEDEMA/ONCOLOGY QUESTIONNAIRE - 07/10/19 0001      Type   Cancer Type left breast cancer      Surgeries   Mastectomy Date 07/15/19    Sentinel Lymph Node Biopsy Date 07/15/19      Lymphedema Assessments   Lymphedema Assessments Upper extremities      Right Upper Extremity Lymphedema   15 cm Proximal to Olecranon Process 23.4 cm    Olecranon Process 20.2 cm    15 cm Proximal to Ulnar Styloid Process 20.2 cm    Just Proximal to Ulnar Styloid Process 14 cm    Across Hand at [employer] 17.4 cm    At [place] of 2nd Digit 5.5 cm      Left Upper Extremity Lymphedema   15 cm Proximal to Olecranon Process 23 cm    Olecranon Process 21.3 cm    15 cm Proximal to Ulnar Styloid Process 19.6 cm    Just Proximal to Ulnar Styloid Process 14 cm    Across Hand at [employer] 17.3 cm    At [place] of 2nd Digit 5.5 cm           L-DEX FLOWSHEETS - 07/10/19 0900      L-DEX LYMPHEDEMA SCREENING   Measurement Type Unilateral    L-DEX MEASUREMENT EXTREMITY Upper Extremity    POSITION  Standing    DOMINANT SIDE Left    At Risk Side Left    BASELINE SCORE (UNILATERAL) -7.6          The patient was assessed using the L-Dex machine today to produce a lymphedema index baseline score. The patient will be reassessed on a regular basis (typically every 3 months) to obtain new L-Dex scores. If the score is > 6.5 points away from his/her baseline score indicating onset of subclinical lymphedema, it will be recommended to wear a compression garment for 4 weeks, 12 hours per day and then be reassessed. If the score continues to be >  6.5 points from baseline at reassessment, we will initiate lymphedema treatment. Assessing in this  manner has a 95% rate of preventing clinically significant lymphedema.     Katina Dung - 07/10/19 0001    Open a tight or new jar No difficulty    Do heavy household chores (wash walls, wash floors) No difficulty    Carry a shopping bag or briefcase No difficulty    Wash your back No difficulty    Use a knife to cut food No difficulty    Recreational activities in which you take some force or impact through your arm, shoulder, or hand (golf, hammering, tennis) No difficulty    During the past week, to what extent has your arm, shoulder or hand problem interfered with your normal social activities with family, friends, neighbors, or groups? Not at all    During the past week, to what extent has your arm, shoulder or hand problem limited your work or other regular daily activities Not at all    Arm, shoulder, or hand pain. Mild    Tingling (pins and needles) in your arm, shoulder, or hand None    Difficulty Sleeping No difficulty    DASH Score 2.27 %            Objective measurements completed on examination: See above findings.      Patient was instructed today in a home exercise program today for post op shoulder range of motion. These included active assist shoulder flexion in sitting, scapular retraction, wall walking with shoulder abduction, and hands behind head external rotation.  She was encouraged to do these twice a day, holding 3 seconds and repeating 5 times when permitted by her physician.                  PT Long Term Goals - 07/10/19 8916      PT LONG TERM GOAL #1   Title Pt will demonstrate she has regained full shoulder ROM and function post operatively compared to baselines.    Time 8    Period Weeks    Status New    Target Date 09/04/19           Breast Clinic Goals - 07/10/19 9450      Patient will be able to verbalize understanding of pertinent lymphedema risk reduction practices relevant to her diagnosis specifically related to skin care.     Status Achieved      Patient will be able to return demonstrate and/or verbalize understanding of the post-op home exercise program related to regaining shoulder range of motion.   Status Achieved      Patient will be able to verbalize understanding of the importance of attending the postoperative After Breast Cancer Class for further lymphedema risk reduction education and therapeutic exercise.   Status Achieved                 Plan - 07/10/19 3888    Clinical Impression Statement Pt seen with recently diagnosed left breast cancer and will undergo bilateral mastectomy and L SLNB on 07/15/19. Pt currently has full shoulder ROM. She will benfeit from a post op PT reassessment to determine needs and from L-Dex screeenings every 3 months to dectect subclinical lymphedema.    Personal Factors and Comorbidities Comorbidity 1    Comorbidities sjogrens/lupus mixed disease    Stability/Clinical Decision Making Stable/Uncomplicated    Clinical Decision Making Low    Rehab Potential Excellent    PT Frequency --  eval and 1 f/u post op then L-Dex screenigns every 3 months   PT Duration 8 weeks    PT Treatment/Interventions ADLs/Self Care Home Management;Patient/family education;Therapeutic exercise    PT Next Visit Plan will reassess 3-4 weeks post op to determine needs    PT Home Exercise Plan post op breast exercises    Consulted and Agree with Plan of Care Patient           Patient will benefit from skilled therapeutic intervention in order to improve the following deficits and impairments:  Postural dysfunction, Decreased knowledge of precautions  Visit Diagnosis: Abnormal posture  Lobular carcinoma in situ (LCIS) of left breast   Patient will follow up at outpatient cancer rehab 3-4 weeks following surgery.  If the patient requires physical therapy at that time, a specific plan will be dictated and sent to the referring physician for approval. The patient was educated today  on appropriate basic range of motion exercises to begin post operatively and the importance of attending the After Breast Cancer class following surgery.  Patient was educated today on lymphedema risk reduction practices as it pertains to recommendations that will benefit the patient immediately following surgery.  She verbalized good understanding.     Problem List Patient Active Problem List   Diagnosis Date Noted  . Genetic testing 07/01/2019  . Breast neoplasm, Tis (LCIS), left 06/15/2019  . Undifferentiated connective tissue disease ([place]) 03/16/2019  . 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 Vitamin D deficiency 07/17/2018  . Pancytopenia ([place]) 07/02/2017  . Fibroid uterus 06/21/2017  . Chronic urticaria 05/06/2014  . Autoimmune disorder ([place]) 04/08/2012  . Hypothyroidism 04/08/2012  . Rhinitis 04/08/2012  . ANA positive 04/08/2012  . Infertility, female 11/13/2010  . Thrombocytopenia ([place]) 11/13/2010    [facility] 07/10/2019, 9:24 AM  [place], [place], [address] Phone: [phone]   Fax:  [phone]  Name: Sarah [name] MRN: [id number] Date of Birth: August 13, 1970  Manus Gunning, PT 07/10/19 9:25 AM

## 2019-07-13 ENCOUNTER — Other Ambulatory Visit (HOSPITAL_COMMUNITY)
Admission: RE | Admit: 2019-07-13 | Discharge: 2019-07-13 | Disposition: A | Discharge status: Home / Self Care | Payer: 59 | Source: Ambulatory Visit | Attending: General Surgery | Admitting: General Surgery

## 2019-07-13 DIAGNOSIS — Z20822 Contact with and (suspected) exposure to covid-19: Secondary | ICD-10-CM | POA: Insufficient documentation

## 2019-07-13 DIAGNOSIS — R921 Mammographic calcification found on diagnostic imaging of breast: Secondary | ICD-10-CM | POA: Diagnosis not present

## 2019-07-13 DIAGNOSIS — Z01812 Encounter for preprocedural laboratory examination: Secondary | ICD-10-CM | POA: Insufficient documentation

## 2019-07-13 DIAGNOSIS — D0502 Lobular carcinoma in situ of left breast: Secondary | ICD-10-CM | POA: Diagnosis not present

## 2019-07-13 DIAGNOSIS — N6311 Unspecified lump in the right breast, upper outer quadrant: Secondary | ICD-10-CM | POA: Diagnosis not present

## 2019-07-13 DIAGNOSIS — N6321 Unspecified lump in the left breast, upper outer quadrant: Secondary | ICD-10-CM | POA: Diagnosis not present

## 2019-07-13 DIAGNOSIS — N979 Female infertility, unspecified: Secondary | ICD-10-CM | POA: Diagnosis not present

## 2019-07-13 LAB — SARS CORONAVIRUS 2 (TAT 6-24 HRS): SARS Coronavirus 2: NEGATIVE

## 2019-07-13 NOTE — Pre-Procedure Instructions (Signed)
[place], [place] - [address] [facility]. [address] [place] [address] Phone: [phone] Fax: [place], [place] [address] Phone: [phone] Fax: [phone]    Your procedure is scheduled on Wed., July 15, 2019 from 8:29AM-10:30AM  Report to [facility] Entrance "A" at 6:30AM  Call this number if you have problems the morning of surgery:  [phone]   Remember:  Do not eat after midnight on June 15th  You may drink clear liquids until 3 hours (5:30AM) prior to surgery time.  Clear liquids allowed are: Water, Juice (non-citric and without pulp - diabetics please choose diet or no sugar options), Carbonated beverages - (diabetics please choose diet or no sugar options), Clear Tea, Black Coffee only (no creamer, milk or cream including half and half), Plain Jell-O only (diabetics please choose diet or no sugar options), Gatorade (diabetics please choose diet or no sugar options) and Plain Popsicles only   Please complete your PRE-SURGERY ENSURE drink that was provided to you by (5:30AM) the morning of surgery.  Please, if able, drink it in one setting. DO NOT SIP.     Take these medicines the morning of surgery with A SIP OF WATER: PredniSONE (DELTASONE)  If Needed: Systane Eye drops  As of today, STOP taking all Aspirin (unless instructed by your doctor) and Other Aspirin containing products, Vitamins, Fish oils, and Herbal medications. Also stop all NSAIDS i.e. Advil, Ibuprofen, Motrin, Aleve, Anaprox, Naproxen, BC, Goody Powders, and all Supplements.  No Smoking of any kind, Tobacco, or Alcohol products 24 hours prior to your procedure. If you use a Cpap at night, you may bring all equipment for your overnight stay.  Special instructions: Sarah [name]- Preparing For Surgery  Before surgery, you can play an important  role. Because skin is not sterile, your skin needs to be as free of germs as possible. You can reduce the number of germs on your skin by washing with CHG (chlorahexidine gluconate) Soap before surgery.  CHG is an antiseptic cleaner which kills germs and bonds with the skin to continue killing germs even after washing.    Please do not use if you have an allergy to CHG or antibacterial soaps. If your skin becomes reddened/irritated stop using the CHG.  Do not shave (including legs and underarms) for at least 48 hours prior to first CHG shower. It is OK to shave your face.  Please follow these instructions carefully.   1. Shower the NIGHT BEFORE SURGERY and the MORNING OF SURGERY with CHG.   2. If you chose to wash your hair, wash your hair first as usual with your normal shampoo.  3. After you shampoo, rinse your hair and body thoroughly to remove the shampoo.  4. Use CHG as you would any other liquid soap. You can apply CHG directly to the skin and wash gently with a scrungie or a clean washcloth.   5. Apply the CHG Soap to your body ONLY FROM THE NECK DOWN.  Do not use on open wounds or open sores. Avoid contact with your eyes, ears, mouth and genitals (private parts). Wash Face and genitals (private parts)  with your normal soap.  6. Wash thoroughly, paying special attention to the area where your surgery will be performed.  7. Thoroughly rinse your body with warm water from the neck down.  8. DO NOT shower/wash with  your normal soap after using and rinsing off the CHG Soap.  9. Pat yourself dry with a CLEAN TOWEL.  10. Wear CLEAN PAJAMAS to bed the night before surgery, wear comfortable clothes the morning of surgery  11. Place CLEAN SHEETS on your bed the night of your first shower and DO NOT SLEEP WITH PETS.  Day of Surgery:            Remember to brush your teeth WITH YOUR REGULAR TOOTHPASTE.  Do not wear jewelry, make-up or nail polish.  Do not wear lotions, powders, or  perfumes, or deodorant.  Do not shave 48 hours prior to surgery.    Do not bring valuables to the hospital.  [facility] is not responsible for any belongings or valuables.  Contacts, dentures or bridgework may not be worn into surgery.   For patients admitted to the hospital, discharge time will be determined by your treatment team.  Patients discharged the day of surgery will not be allowed to drive home, and someone age 82 and over needs to stay with them for 24 hours.   Please wear clean clothes to the hospital/surgery center.    Please read over the following fact sheets that you were given.

## 2019-07-14 ENCOUNTER — Other Ambulatory Visit: Payer: Self-pay | Admitting: General Surgery

## 2019-07-14 ENCOUNTER — Other Ambulatory Visit: Payer: Self-pay

## 2019-07-14 ENCOUNTER — Encounter (HOSPITAL_COMMUNITY): Payer: Self-pay

## 2019-07-14 ENCOUNTER — Encounter (HOSPITAL_COMMUNITY)
Admission: RE | Admit: 2019-07-14 | Discharge: 2019-07-14 | Disposition: A | Discharge status: Home / Self Care | Payer: 59 | Source: Ambulatory Visit | Attending: General Surgery | Admitting: General Surgery

## 2019-07-14 DIAGNOSIS — Z01818 Encounter for other preprocedural examination: Secondary | ICD-10-CM | POA: Diagnosis not present

## 2019-07-14 DIAGNOSIS — N6321 Unspecified lump in the left breast, upper outer quadrant: Secondary | ICD-10-CM | POA: Diagnosis not present

## 2019-07-14 DIAGNOSIS — R921 Mammographic calcification found on diagnostic imaging of breast: Secondary | ICD-10-CM | POA: Diagnosis not present

## 2019-07-14 DIAGNOSIS — N6311 Unspecified lump in the right breast, upper outer quadrant: Secondary | ICD-10-CM | POA: Diagnosis not present

## 2019-07-14 LAB — CBC
HCT: 38.8 % (ref 36.0–46.0)
Hemoglobin: 12.5 g/dL (ref 12.0–15.0)
MCH: 31.7 pg (ref 26.0–34.0)
MCHC: 32.2 g/dL (ref 30.0–36.0)
MCV: 98.5 fL (ref 80.0–100.0)
Platelets: 131 10*3/uL — ABNORMAL LOW (ref 150–400)
RBC: 3.94 MIL/uL (ref 3.87–5.11)
RDW: 12.3 % (ref 11.5–15.5)
WBC: 3 10*3/uL — ABNORMAL LOW (ref 4.0–10.5)
nRBC: 0 % (ref 0.0–0.2)

## 2019-07-14 MED ORDER — HYDROCORTISONE NA SUCCINATE PF 1000 MG IJ SOLR
100.0000 mg | Freq: Once | INTRAMUSCULAR | Status: AC
  Administered 2019-07-15: 100 mg via INTRAVENOUS

## 2019-07-14 NOTE — Anesthesia Preprocedure Evaluation (Addendum)
Anesthesia Evaluation  Patient identified by MRN, date of birth, ID band Patient awake    Reviewed: Allergy & Precautions, NPO status , Patient's Chart, lab work & pertinent test results  History of Anesthesia Complications Negative for: history of anesthetic complications  Airway Mallampati: II  TM Distance: >3 FB Neck ROM: Full    Dental  (+) Dental Advisory Given   Pulmonary neg pulmonary ROS,  07/13/2019 SARS coronavirus NEG   breath sounds clear to auscultation       Cardiovascular negative cardio ROS   Rhythm:Regular Rate:Normal     Neuro/Psych negative neurological ROS     GI/Hepatic Neg liver ROS, GERD  Medicated and Controlled,  Endo/Other  sjogren's  Renal/GU negative Renal ROS     Musculoskeletal   Abdominal   Peds  Hematology negative hematology ROS (+)   Anesthesia Other Findings Breast cancer  Reproductive/Obstetrics                            Anesthesia Physical Anesthesia Plan  ASA: II  Anesthesia Plan: General   Post-op Pain Management: GA combined w/ Regional for post-op pain   Induction: Intravenous  PONV Risk Score and Plan: 3 and Ondansetron, Dexamethasone and Scopolamine patch - Pre-op  Airway Management Planned: LMA  Additional Equipment: None  Intra-op Plan:   Post-operative Plan:   Informed Consent: I have reviewed the patients History and Physical, chart, labs and discussed the procedure including the risks, benefits and alternatives for the proposed anesthesia with the patient or authorized representative who has indicated his/her understanding and acceptance.     Dental advisory given  Plan Discussed with: Surgeon  Anesthesia Plan Comments: (Plan GA- LMA OK, bilat pectoralis blocks for post op analgesia )       Anesthesia Quick Evaluation

## 2019-07-14 NOTE — Progress Notes (Signed)
PCP - Dr. Cathlean Marseilles  Cardiologist - Denies  Chest x-ray - Denies  EKG - Denies  Stress Test - Denies  ECHO - Denies  Cardiac Cath - Denies  AICD-na PM-na LOOP-na  Sleep Study - Denies CPAP - Denies  LABS- 07/13/19: COVID- Neg 07/14/19: CBC  ASA- Denies  ERAS- Yes- 1 Ensure  HA1C- Denies   Anesthesia- No  Pt denies having chest pain, sob, or fever at this time. All instructions explained to the pt, with a verbal understanding of the material. Pt agrees to go over the instructions while at home for a better understanding. Pt also instructed to self quarantine after being tested for COVID-19. The opportunity to ask questions was provided.   Coronavirus Screening  Have you experienced the following symptoms:  Cough yes/no: No Fever (>100.65F)  yes/no: No Runny nose yes/no: No Sore throat yes/no: No Difficulty breathing/shortness of breath  yes/no: No  Have you or a family member traveled in the last 14 days and where? yes/no: No   If the patient indicates "YES" to the above questions, their PAT will be rescheduled to limit the exposure to others and, the surgeon will be notified. THE PATIENT WILL NEED TO BE ASYMPTOMATIC FOR 14 DAYS.   If the patient is not experiencing any of these symptoms, the PAT nurse will instruct them to NOT bring anyone with them to their appointment since they may have these symptoms or traveled as well.   Please remind your patients and families that hospital visitation restrictions are in effect and the importance of the restrictions.

## 2019-07-15 ENCOUNTER — Encounter (HOSPITAL_COMMUNITY): Payer: Self-pay | Admitting: General Surgery

## 2019-07-15 ENCOUNTER — Other Ambulatory Visit: Payer: Self-pay

## 2019-07-15 ENCOUNTER — Observation Stay (HOSPITAL_COMMUNITY): Payer: 59

## 2019-07-15 ENCOUNTER — Encounter (HOSPITAL_COMMUNITY)
Admission: RE | Admit: 2019-07-15 | Discharge: 2019-07-15 | Disposition: A | Discharge status: Home / Self Care | Payer: 59 | Source: Ambulatory Visit | Attending: General Surgery | Admitting: General Surgery

## 2019-07-15 ENCOUNTER — Ambulatory Visit (HOSPITAL_COMMUNITY): Payer: 59 | Admitting: Anesthesiology

## 2019-07-15 ENCOUNTER — Encounter (HOSPITAL_COMMUNITY)
Admission: RE | Disposition: A | Discharge status: Home / Self Care | Payer: Self-pay | Source: Home / Self Care | Attending: General Surgery

## 2019-07-15 ENCOUNTER — Observation Stay (HOSPITAL_COMMUNITY)
Admission: RE | Admit: 2019-07-15 | Discharge: 2019-07-16 | Disposition: A | Discharge status: Home / Self Care | Payer: 59 | Attending: General Surgery | Admitting: General Surgery

## 2019-07-15 DIAGNOSIS — N6012 Diffuse cystic mastopathy of left breast: Secondary | ICD-10-CM | POA: Diagnosis not present

## 2019-07-15 DIAGNOSIS — E039 Hypothyroidism, unspecified: Secondary | ICD-10-CM | POA: Diagnosis not present

## 2019-07-15 DIAGNOSIS — K219 Gastro-esophageal reflux disease without esophagitis: Secondary | ICD-10-CM | POA: Insufficient documentation

## 2019-07-15 DIAGNOSIS — E559 Vitamin D deficiency, unspecified: Secondary | ICD-10-CM | POA: Insufficient documentation

## 2019-07-15 DIAGNOSIS — D0512 Intraductal carcinoma in situ of left breast: Secondary | ICD-10-CM | POA: Diagnosis not present

## 2019-07-15 DIAGNOSIS — Z79899 Other long term (current) drug therapy: Secondary | ICD-10-CM | POA: Insufficient documentation

## 2019-07-15 DIAGNOSIS — R109 Unspecified abdominal pain: Secondary | ICD-10-CM | POA: Diagnosis not present

## 2019-07-15 DIAGNOSIS — M3500 Sicca syndrome, unspecified: Secondary | ICD-10-CM | POA: Diagnosis not present

## 2019-07-15 DIAGNOSIS — G8918 Other acute postprocedural pain: Secondary | ICD-10-CM | POA: Diagnosis not present

## 2019-07-15 DIAGNOSIS — N6011 Diffuse cystic mastopathy of right breast: Secondary | ICD-10-CM | POA: Diagnosis not present

## 2019-07-15 DIAGNOSIS — Z79890 Hormone replacement therapy (postmenopausal): Secondary | ICD-10-CM | POA: Diagnosis not present

## 2019-07-15 DIAGNOSIS — D0502 Lobular carcinoma in situ of left breast: Principal | ICD-10-CM | POA: Insufficient documentation

## 2019-07-15 DIAGNOSIS — C50912 Malignant neoplasm of unspecified site of left female breast: Secondary | ICD-10-CM | POA: Diagnosis not present

## 2019-07-15 DIAGNOSIS — Z7952 Long term (current) use of systemic steroids: Secondary | ICD-10-CM | POA: Diagnosis not present

## 2019-07-15 DIAGNOSIS — N6489 Other specified disorders of breast: Secondary | ICD-10-CM | POA: Diagnosis not present

## 2019-07-15 DIAGNOSIS — Z1501 Genetic susceptibility to malignant neoplasm of breast: Secondary | ICD-10-CM | POA: Diagnosis not present

## 2019-07-15 DIAGNOSIS — R921 Mammographic calcification found on diagnostic imaging of breast: Secondary | ICD-10-CM | POA: Diagnosis not present

## 2019-07-15 DIAGNOSIS — D0500 Lobular carcinoma in situ of unspecified breast: Secondary | ICD-10-CM | POA: Diagnosis present

## 2019-07-15 HISTORY — PX: SIMPLE MASTECTOMY WITH AXILLARY SENTINEL NODE BIOPSY: SHX6098

## 2019-07-15 LAB — POCT PREGNANCY, URINE: Preg Test, Ur: NEGATIVE

## 2019-07-15 SURGERY — SIMPLE MASTECTOMY WITH AXILLARY SENTINEL NODE BIOPSY
Anesthesia: Regional | Site: Breast | Laterality: Bilateral

## 2019-07-15 MED ORDER — PREDNISONE 1 MG PO TABS
2.0000 mg | ORAL_TABLET | Freq: Every day | ORAL | Status: DC
  Filled 2019-07-15 (×2): qty 2

## 2019-07-15 MED ORDER — MIDAZOLAM HCL 2 MG/2ML IJ SOLN
INTRAMUSCULAR | Status: AC
  Filled 2019-07-15: qty 2

## 2019-07-15 MED ORDER — SODIUM CHLORIDE 0.9 % IV SOLN: INTRAVENOUS | Status: DC

## 2019-07-15 MED ORDER — PROMETHAZINE HCL 25 MG/ML IJ SOLN: 6.2500 mg | INTRAMUSCULAR | Status: DC | PRN

## 2019-07-15 MED ORDER — SCOPOLAMINE 1 MG/3DAYS TD PT72
MEDICATED_PATCH | TRANSDERMAL | Status: AC
  Administered 2019-07-15: 1.5 mg via TRANSDERMAL
  Filled 2019-07-15: qty 1

## 2019-07-15 MED ORDER — ALUM & MAG HYDROXIDE-SIMETH 200-200-20 MG/5ML PO SUSP: 30.0000 mL | Freq: Four times a day (QID) | ORAL | Status: DC | PRN

## 2019-07-15 MED ORDER — CHLORHEXIDINE GLUCONATE 0.12 % MT SOLN
OROMUCOSAL | Status: AC
  Administered 2019-07-15: 15 mL via OROMUCOSAL
  Filled 2019-07-15: qty 15

## 2019-07-15 MED ORDER — SODIUM CHLORIDE FLUSH 0.9 % IV SOLN
INTRAVENOUS | Status: AC
  Filled 2019-07-15: qty 10

## 2019-07-15 MED ORDER — HYDROMORPHONE HCL 1 MG/ML IJ SOLN
0.2500 mg | INTRAMUSCULAR | Status: DC | PRN
  Administered 2019-07-15 (×2): 0.5 mg via INTRAVENOUS

## 2019-07-15 MED ORDER — KETAMINE HCL 10 MG/ML IJ SOLN
INTRAMUSCULAR | Status: DC | PRN
  Administered 2019-07-15 (×2): 20 mg via INTRAVENOUS

## 2019-07-15 MED ORDER — STERILE WATER FOR IRRIGATION IR SOLN
Status: DC | PRN
  Administered 2019-07-15: 1000 mL

## 2019-07-15 MED ORDER — ACETAMINOPHEN 500 MG PO TABS: 1000.0000 mg | ORAL_TABLET | ORAL | Status: AC

## 2019-07-15 MED ORDER — ACETAMINOPHEN 500 MG PO TABS
1000.0000 mg | ORAL_TABLET | Freq: Four times a day (QID) | ORAL | Status: DC
  Administered 2019-07-15 – 2019-07-16 (×3): 1000 mg via ORAL
  Filled 2019-07-15 (×3): qty 2

## 2019-07-15 MED ORDER — PHENYLEPHRINE 40 MCG/ML (10ML) SYRINGE FOR IV PUSH (FOR BLOOD PRESSURE SUPPORT)
PREFILLED_SYRINGE | INTRAVENOUS | Status: AC
  Filled 2019-07-15: qty 10

## 2019-07-15 MED ORDER — LACTATED RINGERS IV SOLN: INTRAVENOUS | Status: DC

## 2019-07-15 MED ORDER — ORAL CARE MOUTH RINSE: 15.0000 mL | Freq: Once | OROMUCOSAL | Status: AC

## 2019-07-15 MED ORDER — OXYCODONE HCL 5 MG PO TABS
5.0000 mg | ORAL_TABLET | ORAL | Status: DC | PRN
  Administered 2019-07-16: 5 mg via ORAL
  Filled 2019-07-15: qty 1

## 2019-07-15 MED ORDER — ONDANSETRON HCL 4 MG/2ML IJ SOLN: 4.0000 mg | Freq: Four times a day (QID) | INTRAMUSCULAR | Status: DC | PRN

## 2019-07-15 MED ORDER — OXYCODONE HCL 5 MG PO TABS: 5.0000 mg | ORAL_TABLET | Freq: Once | ORAL | Status: DC | PRN

## 2019-07-15 MED ORDER — 0.9 % SODIUM CHLORIDE (POUR BTL) OPTIME
TOPICAL | Status: DC | PRN
  Administered 2019-07-15: 1000 mL

## 2019-07-15 MED ORDER — POLYETHYL GLYCOL-PROPYL GLYCOL 0.4-0.3 % OP GEL: OPHTHALMIC | Status: DC | PRN

## 2019-07-15 MED ORDER — HYDROMORPHONE HCL 1 MG/ML IJ SOLN
INTRAMUSCULAR | Status: AC
  Filled 2019-07-15: qty 1

## 2019-07-15 MED ORDER — FENTANYL CITRATE (PF) 100 MCG/2ML IJ SOLN
INTRAMUSCULAR | Status: DC | PRN
  Administered 2019-07-15: 25 ug via INTRAVENOUS
  Administered 2019-07-15: 100 ug via INTRAVENOUS

## 2019-07-15 MED ORDER — ENSURE PRE-SURGERY PO LIQD: 296.0000 mL | Freq: Once | ORAL | Status: DC

## 2019-07-15 MED ORDER — DEXAMETHASONE SODIUM PHOSPHATE 4 MG/ML IJ SOLN
INTRAMUSCULAR | Status: DC | PRN
  Administered 2019-07-15: 5 mg via INTRAVENOUS

## 2019-07-15 MED ORDER — CYCLOSPORINE 0.05 % OP EMUL
1.0000 [drp] | OPHTHALMIC | Status: DC
  Administered 2019-07-16: 1 [drp] via OPHTHALMIC
  Filled 2019-07-15 (×2): qty 1

## 2019-07-15 MED ORDER — ONDANSETRON HCL 4 MG/2ML IJ SOLN
INTRAMUSCULAR | Status: AC
  Filled 2019-07-15: qty 2

## 2019-07-15 MED ORDER — ACETAMINOPHEN 500 MG PO TABS
ORAL_TABLET | ORAL | Status: AC
  Administered 2019-07-15: 1000 mg via ORAL
  Filled 2019-07-15: qty 2

## 2019-07-15 MED ORDER — MEPERIDINE HCL 25 MG/ML IJ SOLN: 6.2500 mg | INTRAMUSCULAR | Status: DC | PRN

## 2019-07-15 MED ORDER — OXYCODONE HCL 5 MG PO TABS: 5.0000 mg | ORAL_TABLET | Freq: Four times a day (QID) | ORAL | 0 refills | Status: DC | PRN

## 2019-07-15 MED ORDER — PHENYLEPHRINE 40 MCG/ML (10ML) SYRINGE FOR IV PUSH (FOR BLOOD PRESSURE SUPPORT)
PREFILLED_SYRINGE | INTRAVENOUS | Status: DC | PRN
  Administered 2019-07-15: 120 ug via INTRAVENOUS

## 2019-07-15 MED ORDER — TECHNETIUM TC 99M SULFUR COLLOID FILTERED
1.0000 | Freq: Once | INTRAVENOUS | Status: AC | PRN
  Administered 2019-07-15: 1 via INTRADERMAL

## 2019-07-15 MED ORDER — CEFAZOLIN SODIUM-DEXTROSE 2-4 GM/100ML-% IV SOLN
INTRAVENOUS | Status: AC
  Filled 2019-07-15: qty 100

## 2019-07-15 MED ORDER — PROPOFOL 10 MG/ML IV BOLUS
INTRAVENOUS | Status: DC | PRN
  Administered 2019-07-15: 150 mg via INTRAVENOUS

## 2019-07-15 MED ORDER — ONDANSETRON HCL 4 MG/2ML IJ SOLN
INTRAMUSCULAR | Status: DC | PRN
  Administered 2019-07-15: 4 mg via INTRAVENOUS

## 2019-07-15 MED ORDER — SIMETHICONE 80 MG PO CHEW: 40.0000 mg | CHEWABLE_TABLET | Freq: Four times a day (QID) | ORAL | Status: DC | PRN

## 2019-07-15 MED ORDER — MIDAZOLAM HCL 2 MG/2ML IJ SOLN: 0.5000 mg | Freq: Once | INTRAMUSCULAR | Status: DC | PRN

## 2019-07-15 MED ORDER — POLYVINYL ALCOHOL 1.4 % OP SOLN
1.0000 [drp] | OPHTHALMIC | Status: DC | PRN
  Filled 2019-07-15: qty 15

## 2019-07-15 MED ORDER — BUPIVACAINE-EPINEPHRINE (PF) 0.25% -1:200000 IJ SOLN
INTRAMUSCULAR | Status: DC | PRN
Start: 2019-07-15 — End: 2019-07-15
  Administered 2019-07-15 (×2): 25 mL

## 2019-07-15 MED ORDER — KETOROLAC TROMETHAMINE 15 MG/ML IJ SOLN
15.0000 mg | INTRAMUSCULAR | Status: AC
  Administered 2019-07-15: 15 mg via INTRAVENOUS

## 2019-07-15 MED ORDER — LIDOCAINE 2% (20 MG/ML) 5 ML SYRINGE
INTRAMUSCULAR | Status: DC | PRN
  Administered 2019-07-15: 60 mg via INTRAVENOUS

## 2019-07-15 MED ORDER — KETOROLAC TROMETHAMINE 15 MG/ML IJ SOLN: 15.0000 mg | Freq: Four times a day (QID) | INTRAMUSCULAR | Status: DC | PRN

## 2019-07-15 MED ORDER — PANTOPRAZOLE SODIUM 40 MG IV SOLR
40.0000 mg | INTRAVENOUS | Status: DC
  Administered 2019-07-15: 40 mg via INTRAVENOUS
  Filled 2019-07-15: qty 40

## 2019-07-15 MED ORDER — GABAPENTIN 100 MG PO CAPS
ORAL_CAPSULE | ORAL | Status: AC
  Administered 2019-07-15: 100 mg via ORAL
  Filled 2019-07-15: qty 1

## 2019-07-15 MED ORDER — ONDANSETRON 4 MG PO TBDP: 4.0000 mg | ORAL_TABLET | Freq: Four times a day (QID) | ORAL | Status: DC | PRN

## 2019-07-15 MED ORDER — CEFAZOLIN SODIUM-DEXTROSE 2-4 GM/100ML-% IV SOLN
2.0000 g | INTRAVENOUS | Status: AC
  Administered 2019-07-15: 2 g via INTRAVENOUS

## 2019-07-15 MED ORDER — HYDROXYCHLOROQUINE SULFATE 200 MG PO TABS: 200.0000 mg | ORAL_TABLET | Freq: Every evening | ORAL | Status: DC

## 2019-07-15 MED ORDER — METHYLENE BLUE 0.5 % INJ SOLN
INTRAVENOUS | Status: AC
  Filled 2019-07-15: qty 10

## 2019-07-15 MED ORDER — ALUM & MAG HYDROXIDE-SIMETH 200-200-20 MG/5ML PO SUSP
10.0000 mL | Freq: Once | ORAL | Status: DC
  Filled 2019-07-15 (×3): qty 30

## 2019-07-15 MED ORDER — GABAPENTIN 100 MG PO CAPS: 100.0000 mg | ORAL_CAPSULE | ORAL | Status: AC

## 2019-07-15 MED ORDER — CHLORHEXIDINE GLUCONATE 0.12 % MT SOLN: 15.0000 mL | Freq: Once | OROMUCOSAL | Status: AC

## 2019-07-15 MED ORDER — KETAMINE HCL 50 MG/5ML IJ SOSY
PREFILLED_SYRINGE | INTRAMUSCULAR | Status: AC
  Filled 2019-07-15: qty 5

## 2019-07-15 MED ORDER — METHOCARBAMOL 500 MG PO TABS
500.0000 mg | ORAL_TABLET | Freq: Three times a day (TID) | ORAL | Status: DC
  Administered 2019-07-15: 500 mg via ORAL
  Filled 2019-07-15: qty 1

## 2019-07-15 MED ORDER — SCOPOLAMINE 1 MG/3DAYS TD PT72: 1.0000 | MEDICATED_PATCH | TRANSDERMAL | Status: DC

## 2019-07-15 MED ORDER — LIDOCAINE 2% (20 MG/ML) 5 ML SYRINGE
INTRAMUSCULAR | Status: AC
  Filled 2019-07-15: qty 5

## 2019-07-15 MED ORDER — MIDAZOLAM HCL 5 MG/5ML IJ SOLN
INTRAMUSCULAR | Status: DC | PRN
  Administered 2019-07-15 (×2): 1 mg via INTRAVENOUS

## 2019-07-15 MED ORDER — MORPHINE SULFATE (PF) 2 MG/ML IV SOLN: 1.0000 mg | INTRAVENOUS | Status: DC | PRN

## 2019-07-15 MED ORDER — DEXAMETHASONE SODIUM PHOSPHATE 10 MG/ML IJ SOLN
INTRAMUSCULAR | Status: AC
  Filled 2019-07-15: qty 1

## 2019-07-15 MED ORDER — FENTANYL CITRATE (PF) 250 MCG/5ML IJ SOLN
INTRAMUSCULAR | Status: AC
  Filled 2019-07-15: qty 5

## 2019-07-15 MED ORDER — KETOROLAC TROMETHAMINE 15 MG/ML IJ SOLN
INTRAMUSCULAR | Status: AC
  Filled 2019-07-15: qty 1

## 2019-07-15 MED ORDER — HYDROCORTISONE NA SUCCINATE PF 250 MG IJ SOLR
INTRAMUSCULAR | Status: AC
  Filled 2019-07-15: qty 250

## 2019-07-15 MED ORDER — OXYCODONE HCL 5 MG/5ML PO SOLN: 5.0000 mg | Freq: Once | ORAL | Status: DC | PRN

## 2019-07-15 MED ORDER — PROPOFOL 10 MG/ML IV BOLUS
INTRAVENOUS | Status: AC
  Filled 2019-07-15: qty 20

## 2019-07-15 MED FILL — oxyCODONE HCL 5 MG TABS: 5 | 3 days supply | Qty: 10 | Fill #0

## 2019-07-15 SURGICAL SUPPLY — 36 items
APPLIER CLIP 9.375 MED OPEN (MISCELLANEOUS) ×2
BINDER BREAST MEDIUM (GAUZE/BANDAGES/DRESSINGS) ×2 IMPLANT
BIOPATCH RED 1 DISK 7.0 (GAUZE/BANDAGES/DRESSINGS) ×4 IMPLANT
CANISTER SUCT 3000ML PPV (MISCELLANEOUS) ×2 IMPLANT
CHLORAPREP W/TINT 26 (MISCELLANEOUS) ×4 IMPLANT
CLIP APPLIE 9.375 MED OPEN (MISCELLANEOUS) ×1 IMPLANT
COVER PROBE W GEL 5X96 (DRAPES) ×2 IMPLANT
COVER SURGICAL LIGHT HANDLE (MISCELLANEOUS) ×2 IMPLANT
DERMABOND ADVANCED (GAUZE/BANDAGES/DRESSINGS) ×1
DERMABOND ADVANCED .7 DNX12 (GAUZE/BANDAGES/DRESSINGS) ×1 IMPLANT
DRAIN CHANNEL 19F RND (DRAIN) ×4 IMPLANT
DRAPE ORTHO SPLIT 77X108 STRL (DRAPES) ×2
DRAPE SURG ORHT 6 SPLT 77X108 (DRAPES) ×2 IMPLANT
DRSG TEGADERM 4X4.75 (GAUZE/BANDAGES/DRESSINGS) ×2 IMPLANT
ELECT REM PT RETURN 9FT ADLT (ELECTROSURGICAL) ×2
ELECTRODE REM PT RTRN 9FT ADLT (ELECTROSURGICAL) ×1 IMPLANT
EVACUATOR SILICONE 100CC (DRAIN) ×4 IMPLANT
GLOVE BIOGEL PI IND STRL 7.5 (GLOVE) ×1 IMPLANT
GLOVE BIOGEL PI INDICATOR 7.5 (GLOVE) ×1
GLOVE SURG SS PI 7.0 STRL IVOR (GLOVE) ×2 IMPLANT
GOWN STRL REUS W/ TWL LRG LVL3 (GOWN DISPOSABLE) ×2 IMPLANT
GOWN STRL REUS W/TWL LRG LVL3 (GOWN DISPOSABLE) ×2
KIT BASIN OR (CUSTOM PROCEDURE TRAY) ×2 IMPLANT
KIT TURNOVER KIT B (KITS) ×2 IMPLANT
NS IRRIG 1000ML POUR BTL (IV SOLUTION) ×2 IMPLANT
PACK GENERAL/GYN (CUSTOM PROCEDURE TRAY) ×2 IMPLANT
PAD ARMBOARD 7.5X6 YLW CONV (MISCELLANEOUS) ×2 IMPLANT
PENCIL SMOKE EVACUATOR (MISCELLANEOUS) ×2 IMPLANT
PIN SAFETY STERILE (MISCELLANEOUS) ×2 IMPLANT
STRIP CLOSURE SKIN 1/2X4 (GAUZE/BANDAGES/DRESSINGS) ×4 IMPLANT
SUT ETHILON 2 0 FS 18 (SUTURE) ×4 IMPLANT
SUT MNCRL AB 4-0 PS2 18 (SUTURE) ×4 IMPLANT
SUT SILK 2 0 PERMA HAND 18 BK (SUTURE) ×2 IMPLANT
SUT VIC AB 2-0 SH 18 (SUTURE) ×2 IMPLANT
SUT VIC AB 3-0 SH 8-18 (SUTURE) ×4 IMPLANT
TOWEL GREEN STERILE FF (TOWEL DISPOSABLE) ×2 IMPLANT

## 2019-07-15 NOTE — Discharge Instructions (Signed)
[facility] °[phone] ° °MASTECTOMY: POST OP INSTRUCTIONS °Take 400 mg of ibuprofen every 8 hours or 650 mg tylenol every 6 hours for next 72 hours then as needed. Use ice several times daily also. °Always review your discharge instruction sheet given to you by the facility where your surgery was performed. ° °IF YOU HAVE DISABILITY OR FAMILY LEAVE FORMS, YOU MUST BRING THEM TO THE OFFICE FOR PROCESSING.   °DO NOT GIVE THEM TO YOUR DOCTOR. °A prescription for pain medication may be given to you upon discharge.  Take your pain medication as prescribed, if needed.  If narcotic pain medicine is not needed, then you may take acetaminophen (Tylenol), naprosyn (Alleve) or ibuprofen (Advil) as needed. °1. Take your usually prescribed medications unless otherwise directed. °2. If you need a refill on your pain medication, please contact your pharmacy.  They will contact our office to request authorization.  Prescriptions will not be filled after 5pm or on week-ends. °3. You should follow a light diet the first few days after arrival home, such as soup and crackers, etc.  Resume your normal diet the day after surgery. °4. Most patients will experience some swelling and bruising on the chest and underarm.  Ice packs will help.  Swelling and bruising can take several days to resolve. Wear the binder day and night until you return to the office.  °5. It is common to experience some constipation if taking pain medication after surgery.  Increasing fluid intake and taking a stool softener (such as Colace) will usually help or prevent this problem from occurring.  A mild laxative (Milk of Magnesia or Miralax) should be taken according to package instructions if there are no bowel movements after 48 hours. °6. Unless discharge instructions indicate otherwise, leave your bandage dry and in place until your next appointment in 3-5 days.  You may take a limited sponge bath.  No tube baths or showers until the  drains are removed.  You may have steri-strips (small skin tapes) in place directly over the incision.  These strips should be left on the skin for 7-10 days. If you have glue it will come off in next couple week.  Any sutures will be removed at an office visit °7. DRAINS:  If you have drains in place, it is important to keep a list of the amount of drainage produced each day in your drains.  Before leaving the hospital, you should be instructed on drain care.  Call our office if you have any questions about your drains. I will remove your drains when they put out less than 30 cc or ml for 2 consecutive days. °8. ACTIVITIES:  You may resume regular (light) daily activities beginning the next day--such as daily self-care, walking, climbing stairs--gradually increasing activities as tolerated.  You may have sexual intercourse when it is comfortable.  Refrain from any heavy lifting or straining until approved by your doctor. °a. You may drive when you are no longer taking prescription pain medication, you can comfortably wear a seatbelt, and you can safely maneuver your car and apply brakes. °b. RETURN TO WORK:  __________________________________________________________ °9. You should see your doctor in the office for a follow-up appointment approximately 3-5 days after your surgery.  Your doctor’s nurse will typically make your follow-up appointment when she calls you with your pathology report.  Expect your pathology report 3-4business days after surgery. °10. OTHER INSTRUCTIONS: ______________________________________________________________________________________________ ____________________________________________________________________________________________ °WHEN TO CALL YOUR DR Nanami Whitelaw: °1. Fever over 101.0 °  2. Nausea and/or vomiting °3. Extreme swelling or bruising °4. Continued bleeding from incision. °5. Increased pain, redness, or drainage from the incision. °The clinic staff is available to answer your  questions during regular business hours.  Please don’t hesitate to call and ask to speak to one of the nurses for clinical concerns.  If you have a medical emergency, go to the nearest emergency room or call 911.  A surgeon from [facility] is always on call at the hospital. °[address], [address], [place], [place]  [address] ? [address] [address], [place], [place]   [address] °[phone] ? [phone] ? [phone] [phone] °Web site: [email or web address] ° °

## 2019-07-15 NOTE — Op Note (Signed)
Preoperative diagnosis: 1.  Left pleomorphic lobular carcinoma in situ 2.  High risk for breast cancer with a calculated Tyer Cuzick score of 70% risk Postoperative diagnosis: Same as above Procedure: 1.  Right risk reducing mastectomy 2.  Left mastectomy 3.  Left axillary sentinel lymph node biopsy Surgeon: Dr. Serita Grammes Assistant: Judyann Munson Anesthesia: General with bilateral pectoral blocks Specimens: 1.  Right breast tissue marked short superior, long lateral 2.  Left breast tissue marked short superior, long lateral 3.  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 she has recent dx of sjogrens disease and is on plaquenil and low dose prednisone. she cannot get off the prednisone now without symptoms. she is followed at [facility] by rheum and ophtho. she has weight loss not really explained. she has no prior breast history. she has no real mass or dc. she has no family history. she has recent negative egd and colonoscopy. she has a mm in april that shows d density breasts. she had calcs noted in the left breast. these measure 16x10x11 mm in size. biopsy was done that shows pleomorphic lcis and fea with alh and fea at other site. she also has mri with numerous findings that include: IMPRESSION: 1. Suspicious clumped non mass enhancement in the upper outer quadrant of the LEFT breast measuring 2.2 cm. 2. Focal non mass enhancement in the lower inner LEFT breast measuring 0.9 cm is indeterminate. 3. Enhancing mass in the upper central RIGHT breast measuring 0.6 cm. 4. Two additional small areas of suspicious non mass enhancement in the outer and lower outer RIGHT breast as above. she has undergone additional left and right breast biopsies by mr of the most suspicious areas after  my conversation with Dr Rosana Hoes. these are all pash, fcc and concordant.  She elected to not undergo any reconstruction.  We discussed all of her options and she eventually elected to proceed with a risk reducing mastectomy on the right and a mastectomy on the left side.  Procedure: After informed consent was obtained the patient underwent bilateral pectoral blocks.  She was given antibiotics.  SCDs were in place.  She was given 100 mg of hydrocortisone per her rheumatologist recommendations.  She was also given Toradol and Tylenol.  She was then placed under general anesthesia without complication.  She was prepped and draped in the standard sterile surgical fashion.  A surgical timeout was then performed.  I first did the right mastectomy.  I made an elliptical incision to encompass the nipple and the areola.  I made flaps to the clavicle, parasternal area, inframammary fold, and latissimus laterally.  The breast was then removed from the pectoralis muscle to include the fascia.  This was then passed off the table.  Hemostasis was obtained.  I placed a 43 [place] Blake drain and secured this with a 2-0 nylon suture.  I then closed this with 3-0 Vicryl.  I tacked the lateral portion down with 2-0 Vicryl's.  The skin was closed with 4-0 Monocryl and glue.  I then proceeded to do the left mastectomy.  I made a similar incision and carried flaps in the same positions.  I then remove the breast tissue to include the pectoralis fascia.  This was marked as above.  I then entered into the axilla.  There  was what appeared to be one sentinel node with a count listed as above that was removed.  I passed this off the table and there was no real background radioactivity.  I then closed the axilla after obtaining hemostasis with 2-0 Vicryl suture.  I placed a 14 [place] Blake drain and secured this with a 2-0 nylon suture.  I then obtained hemostasis throughout the rest.  I tacked the lateral portion down with a 2-0 Vicryl.   This was closed with a 3-0 Vicryl and a 4-0 Monocryl.  Glue was placed.  She tolerated this well was extubated and transferred to recovery in stable condition.

## 2019-07-15 NOTE — H&P (Signed)
Sarah [name] is an 49 y.o. female.   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 she has recent dx of sjogrens disease and is on plaquenil and low dose prednisone. she cannot get off the prednisone now without symptoms. she is followed at [facility] by rheum and ophtho. she has weight loss not really explained. she has no prior breast history. she has no real mass or dc. she has no family history. she has recent negative egd and colonoscopy. she has a mm in april that shows d density breasts. she had calcs noted in the left breast. these measure 16x10x11 mm in size. biopsy was done that shows pleomorphic lcis and fea with alh and fea at other site. she also has mri with numerous findings that include: IMPRESSION: 1. Suspicious clumped non mass enhancement in the upper outer quadrant of the LEFT breast measuring 2.2 cm. 2. Focal non mass enhancement in the lower inner LEFT breast measuring 0.9 cm is indeterminate. 3. Enhancing mass in the upper central RIGHT breast measuring 0.6 cm. 4. Two additional small areas of suspicious non mass enhancement in the outer and lower outer RIGHT breast as above. she has undergone additional left and right breast biopsies by mr of the most suspicious areas after my conversation with Dr Rosana Hoes. these are all pash, fcc and concordant. she has also seen plastic surgery now she is here to discuss options    Past Medical History:  Diagnosis Date  . ANA positive    ?Sjogrens  . Breast neoplasm, Tis (LCIS), left   . Cholecystitis   . Cholelithiasis   . Dry eye   . Fibroid   . Gall stones   . GERD (gastroesophageal reflux disease)   . Hypothyroidism    Takes synthroid  . Infertility, female   . Keratitis 2020  . Lactose intolerance   . Leukopenia   . Sjogren's disease ([place])   . Thrombocytopenia ([place])   . Urticaria    episodes since childhood   . Vitamin D deficiency disease    Takes Vit D  . Weight loss      Past Surgical History:  Procedure Laterality Date  . CHOLECYSTECTOMY N/A 07/13/2014   Procedure: LAPAROSCOPIC CHOLECYSTECTOMY WITH INTRAOPERATIVE CHOLANGIOGRAM;  Surgeon: Rolm Bookbinder, MD;  Location: [facility];  Service: General;  Laterality: N/A;  . egg retrieval  2008; 2011; 2012   multiple IVF cycles  . ENDOMETRIAL BIOPSY    . WISDOM TOOTH EXTRACTION      Family History  Problem Relation Age of Onset  . Lupus Sister   . Thyroid disease Sister   . Thyroid disease Mother    Social History:  reports that she has never smoked. She has never used smokeless tobacco. She reports that she does not drink alcohol and does not use drugs.  Allergies:  Allergies  Allergen Reactions  . Lactose Intolerance (Gi) Diarrhea    Stomach pain  . Lifitegrast Other (See Comments)    corneal lesion  . Latex Rash  . Tape Rash    Medications Prior to Admission  Medication Sig Dispense Refill  . cycloSPORINE, PF, (CEQUA) 0.09 % SOLN Place 1 drop into both eyes in the morning and at bedtime.     . hydroxychloroquine (PLAQUENIL) 200 MG tablet Take 200 mg by mouth every evening.     Vladimir Faster Glycol-Propyl Glycol (SYSTANE OP) Place 1 drop into both eyes every 4 (four) hours as needed (  dry eyes).    . predniSONE (DELTASONE) 1 MG tablet Take 2 mg by mouth daily.    Marland Kitchen UNABLE TO FIND Place 1 drop into both eyes 6 (six) times daily. Platelet rich growth factor eye drops    . bismuth-metronidazole-tetracycline (PYLERA) 140-125-125 MG capsule Take 3 capsules by mouth 4 (four) times daily -  before meals and at bedtime. (Patient not taking: Reported on 07/09/2019) 120 capsule 0  . pantoprazole (PROTONIX) 40 MG tablet Take 1 tablet (40 mg total) by mouth 2 (two) times daily. (Patient not taking: Reported on 07/09/2019) 20 tablet 0    Results for orders placed or performed during the hospital encounter of 07/14/19 (from the past 48 hour(s))  CBC     Status: Abnormal   Collection Time: 07/14/19  9:35 AM   Result Value Ref Range   WBC 3.0 (L) 4.0 - 10.5 K/uL   RBC 3.94 3.87 - 5.11 MIL/uL   Hemoglobin 12.5 12.0 - 15.0 g/dL   HCT 38.8 36 - 46 %   MCV 98.5 80.0 - 100.0 fL   MCH 31.7 26.0 - 34.0 pg   MCHC 32.2 30.0 - 36.0 g/dL   RDW 12.3 11.5 - 15.5 %   Platelets 131 (L) 150 - 400 K/uL    Comment: REPEATED TO VERIFY   nRBC 0.0 0.0 - 0.2 %    Comment: Performed at [facility], [place] [address], [place], [place] [address]   No results found.  Review of Systems  All other systems reviewed and are negative.   Last menstrual period 06/27/2019. 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 At minimum she needs pLCIS excised (this is treated like dcis) with lumpectomy and then the other FEA area excised. due to her calculated TC risk of over 70% and the LCIS in multiple areas she would like to consider left mastectomy (she has requested sn biopsy) and right rrm. I think this is reasonable to due her risk and lcis. this follows national guidelines also. she understands mastectomy is not 100% preventive and there still is small chance of breast cancer. she does not want to undergo plastic surgery reconstruction at all either alot due to her other autoimmune issues right now. she is going to check with provider treating her with steroids about plan also. I discussed mastectomy bilaterally with her with flat closure. risks and recovery discussed. we also discussed sn biopsy (I think reasonable given lcis and mastectomy) but this does carry risk of lymphedema, neuropathy and pain with it.  Rolm Bookbinder, MD 07/15/2019, 6:44 AM

## 2019-07-15 NOTE — Transfer of Care (Addendum)
Immediate Anesthesia Transfer of Care Note  Patient: Sarah [name]  Procedure(s) Performed: RIGHT RISK REDUCING MASTECTOMY AND LEFT MASTECTOMY  WITH LEFT AXILLARY SENTINEL NODE BIOPSY (Bilateral Breast)  Patient Location: PACU  Anesthesia Type:General and Regional  Level of Consciousness: awake and drowsy  Airway & Oxygen Therapy: Patient Spontanous Breathing and Patient connected to nasal cannula oxygen  Post-op Assessment: Report given to RN and Post -op Vital signs reviewed and stable  Post vital signs: Reviewed and stable  Last Vitals:  Vitals Value Taken Time  BP 138/84 07/15/19 1029  Temp    Pulse 65 07/15/19 1031  Resp 25 07/15/19 1031  SpO2 100 % 07/15/19 1031  Vitals shown include unvalidated device data.  Last Pain:  Vitals:   07/15/19 0736  TempSrc:   PainSc: 0-No pain         Complications: No complications documented.

## 2019-07-15 NOTE — Anesthesia Procedure Notes (Signed)
Procedure Name: LMA Insertion Date/Time: 07/15/2019 8:43 AM Performed by: Trinna Post., CRNA Pre-anesthesia Checklist: Patient identified, Emergency Drugs available, Suction available, Patient being monitored and Timeout performed Patient Re-evaluated:Patient Re-evaluated prior to induction Oxygen Delivery Method: Circle system utilized Preoxygenation: Pre-oxygenation with 100% oxygen Induction Type: IV induction Ventilation: Mask ventilation without difficulty LMA: LMA inserted LMA Size: 4.0 Number of attempts: 1 Placement Confirmation: positive ETCO2 and breath sounds checked- equal and bilateral Tube secured with: Tape Dental Injury: Teeth and Oropharynx as per pre-operative assessment

## 2019-07-15 NOTE — Plan of Care (Signed)
  Problem: Education: Goal: Knowledge of disease or condition will improve Outcome: Progressing   

## 2019-07-15 NOTE — Anesthesia Procedure Notes (Addendum)
Anesthesia Regional Block: Pectoralis block   Pre-Anesthetic Checklist: ,, timeout performed, Correct Patient, Correct Site, Correct Laterality, Correct Procedure, Correct Position, site marked, Risks and benefits discussed,  Surgical consent,  Pre-op evaluation,  At surgeon's request and post-op pain management  Laterality: Right  Prep: chloraprep       Needles:  Injection technique: Single-shot  Needle Type: Echogenic Needle     Needle Length: 9cm  Needle Gauge: 21     Additional Needles:   Procedures:,,,, ultrasound used (permanent image in chart),,,,  Narrative:  Start time: 07/15/2019 7:57 AM End time: 07/15/2019 8:03 AM Injection made incrementally with aspirations every 5 mL.  Performed by: Personally  Anesthesiologist: Annye Asa, MD  Additional Notes: Pt identified in Holding room.  Monitors applied. Working IV access confirmed. Sterile prep, drape R clavicle and pec.  #21ga ECHOgenic needle between pec and serratus, between ribs 4,5 with US guidance.  25cc 0.25% Bupivacaine with 1:200k epi injected incrementally after negative test dose.  Patient asymptomatic, VSS, no heme aspirated, tolerated well.  Jenita Seashore, MD

## 2019-07-15 NOTE — Anesthesia Procedure Notes (Signed)
Anesthesia Regional Block: Pectoralis block   Pre-Anesthetic Checklist: ,, timeout performed, Correct Patient, Correct Site, Correct Laterality, Correct Procedure, Correct Position, site marked, Risks and benefits discussed,  Surgical consent,  Pre-op evaluation,  At surgeon's request and post-op pain management  Laterality: Left  Prep: chloraprep       Needles:  Injection technique: Single-shot  Needle Type: Echogenic Needle     Needle Length: 9cm  Needle Gauge: 21     Additional Needles:   Procedures:,,,, ultrasound used (permanent image in chart),,,,  Narrative:  Start time: 07/15/2019 8:04 AM End time: 07/15/2019 8:06 AM Injection made incrementally with aspirations every 5 mL.  Performed by: Personally  Anesthesiologist: Annye Asa, MD  Additional Notes: Pt identified in Holding room.  Monitors applied. Working IV access confirmed. Sterile prep, drape L clavicle and pec.  #21ga ECHOgenic needle between serratus and pec, ribs 4,5 with US guidance.  25cc 0.25% Bupivacaine with 1:200k epi injected incrementally after negative test dose.  Patient asymptomatic, VSS, no heme aspirated, tolerated well.  Jenita Seashore, MD

## 2019-07-15 NOTE — Anesthesia Postprocedure Evaluation (Signed)
Anesthesia Post Note  Patient: Sarah [name]  Procedure(s) Performed: RIGHT RISK REDUCING MASTECTOMY AND LEFT MASTECTOMY  WITH LEFT AXILLARY SENTINEL NODE BIOPSY (Bilateral Breast)     Patient location during evaluation: PACU Anesthesia Type: Regional Level of consciousness: awake and alert, oriented and patient cooperative Pain management: pain level controlled Vital Signs Assessment: post-procedure vital signs reviewed and stable Respiratory status: spontaneous breathing, nonlabored ventilation and respiratory function stable Cardiovascular status: blood pressure returned to baseline and stable Postop Assessment: no apparent nausea or vomiting Anesthetic complications: no Comments: Pt had Abdominal pain in PACU, now relieved      No complications documented.  Last Vitals:  Vitals:   07/15/19 1200 07/15/19 1248  BP: 112/71 106/71  Pulse: 68 72  Resp: 17 16  Temp:  36.9 C  SpO2: 97% 98%    Last Pain:  Vitals:   07/15/19 1248  TempSrc: Oral  PainSc:                  Ari Engelbrecht,E. Sotiria Keast

## 2019-07-16 ENCOUNTER — Encounter (HOSPITAL_COMMUNITY): Payer: Self-pay | Admitting: General Surgery

## 2019-07-16 DIAGNOSIS — Z79890 Hormone replacement therapy (postmenopausal): Secondary | ICD-10-CM | POA: Diagnosis not present

## 2019-07-16 DIAGNOSIS — Z79899 Other long term (current) drug therapy: Secondary | ICD-10-CM | POA: Diagnosis not present

## 2019-07-16 DIAGNOSIS — D0502 Lobular carcinoma in situ of left breast: Secondary | ICD-10-CM | POA: Diagnosis not present

## 2019-07-16 DIAGNOSIS — N6489 Other specified disorders of breast: Secondary | ICD-10-CM | POA: Diagnosis not present

## 2019-07-16 DIAGNOSIS — N6011 Diffuse cystic mastopathy of right breast: Secondary | ICD-10-CM | POA: Diagnosis not present

## 2019-07-16 DIAGNOSIS — N6012 Diffuse cystic mastopathy of left breast: Secondary | ICD-10-CM | POA: Diagnosis not present

## 2019-07-16 DIAGNOSIS — M3500 Sicca syndrome, unspecified: Secondary | ICD-10-CM | POA: Diagnosis not present

## 2019-07-16 DIAGNOSIS — Z7952 Long term (current) use of systemic steroids: Secondary | ICD-10-CM | POA: Diagnosis not present

## 2019-07-16 DIAGNOSIS — E039 Hypothyroidism, unspecified: Secondary | ICD-10-CM | POA: Diagnosis not present

## 2019-07-16 DIAGNOSIS — N6092 Unspecified benign mammary dysplasia of left breast: Secondary | ICD-10-CM | POA: Diagnosis not present

## 2019-07-16 DIAGNOSIS — K219 Gastro-esophageal reflux disease without esophagitis: Secondary | ICD-10-CM | POA: Diagnosis not present

## 2019-07-16 DIAGNOSIS — N6021 Fibroadenosis of right breast: Secondary | ICD-10-CM | POA: Diagnosis not present

## 2019-07-16 DIAGNOSIS — E559 Vitamin D deficiency, unspecified: Secondary | ICD-10-CM | POA: Diagnosis not present

## 2019-07-16 DIAGNOSIS — G8918 Other acute postprocedural pain: Secondary | ICD-10-CM | POA: Diagnosis not present

## 2019-07-16 MED ORDER — PANTOPRAZOLE SODIUM 40 MG PO TBEC
40.0000 mg | DELAYED_RELEASE_TABLET | Freq: Two times a day (BID) | ORAL | 1 refills | Status: DC
Start: 2019-07-16 — End: 2020-10-17

## 2019-07-16 MED ORDER — OXYCODONE HCL 5 MG PO TABS: 5.0000 mg | ORAL_TABLET | ORAL | 0 refills | Status: DC | PRN

## 2019-07-16 MED ORDER — METHOCARBAMOL 500 MG PO TABS: 500.0000 mg | ORAL_TABLET | Freq: Three times a day (TID) | ORAL | 0 refills | Status: DC

## 2019-07-16 MED FILL — PANTOPRAZOLE SOD DR 40 MG T: 40 | 10 days supply | Qty: 20 | Fill #0

## 2019-07-16 MED FILL — METHOCARBAMOL 500 MG TABS: 500 | 7 days supply | Qty: 20 | Fill #0

## 2019-07-16 NOTE — Discharge Summary (Signed)
Physician Discharge Summary  Patient ID: Sarah [name] MRN: [id number] DOB/AGE: 1970/03/27 49 y.o.  Admit date: 07/15/2019 Discharge date: 07/16/2019  Admission Diagnoses: sjogrens syndrome Pleomorphic lcis  High risk breast cancer ANA positive Keratitis  Discharge Diagnoses:  Active Problems:   Breast neoplasm, Tis (LCIS)   Discharged Condition: good  Hospital Course: 51 yof elected to undergo bilateral mastectomies with left ax sn biopsy. Had some abdominal pain postop responsive to PPI and Maalox. Film negative and improved.  She was doing well following am and discharged home.  Consults: None  Significant Diagnostic Studies: none  Treatments: surgery: bilateral mastectomies, left ax sn biopsy  Discharge Exam: Blood pressure (!) 150/88, pulse 70, temperature 97.6 F (36.4 C), temperature source Oral, resp. rate 18, height 5\' 3"  (1.6 m), weight 49.9 kg, last menstrual period 06/27/2019, SpO2 100 %. drains as expected, no hematoma, flaps viable  Disposition: Discharge disposition: 01-Home or Self Care        Allergies as of 07/16/2019      Reactions   Lactose Intolerance (gi) Diarrhea   Stomach pain   Lifitegrast Other (See Comments)   corneal lesion   Latex Rash   Tape Rash      Medication List    TAKE these medications   bismuth-metronidazole-tetracycline 140-125-125 MG capsule Commonly known as: PYLERA Take 3 capsules by mouth 4 (four) times daily -  before meals and at bedtime.   Cequa 0.09 % Soln Generic drug: cycloSPORINE (PF) Place 1 drop into both eyes in the morning and at bedtime.   hydroxychloroquine 200 MG tablet Commonly known as: PLAQUENIL Take 200 mg by mouth every evening.   methocarbamol 500 MG tablet Commonly known as: ROBAXIN Take 1 tablet (500 mg total) by mouth 3 (three) times daily. Notes to patient: Last given 6/16 at 9 pm   oxyCODONE 5 MG immediate release tablet Commonly known as: Oxy IR/ROXICODONE Take 1 tablet (5 mg total)  by mouth every 4 (four) hours as needed for moderate pain.   pantoprazole 40 MG tablet Commonly known as: Protonix Take 1 tablet (40 mg total) by mouth 2 (two) times daily.   predniSONE 1 MG tablet Commonly known as: DELTASONE Take 2 mg by mouth daily.   SYSTANE OP Place 1 drop into both eyes every 4 (four) hours as needed (dry eyes).   UNABLE TO FIND Place 1 drop into both eyes 6 (six) times daily. Platelet rich growth factor eye drops       Follow-up Information    Rolm Bookbinder, MD In 1 week.   Specialty: General Surgery Contact information: [address] [place] [address] [phone]               Signed: Rolm Bookbinder 07/16/2019, 1:39 PM

## 2019-07-16 NOTE — Progress Notes (Signed)
Discharge instructions reviewed with patient. Patient able to answer teach back questions re: f/u appointment, pain management, and when to call the surgeon. Husband educated on [place] care. And able to do return demonstration. Medication duplicates clarified with MD. Printed AVS given to patient. Questions answered by RN.

## 2019-07-17 LAB — SURGICAL PATHOLOGY

## 2019-07-20 ENCOUNTER — Encounter: Payer: 59 | Admitting: Family Medicine

## 2019-07-21 NOTE — Progress Notes (Signed)
Pt attempted - unable to reach

## 2019-07-22 ENCOUNTER — Other Ambulatory Visit: Payer: Self-pay

## 2019-07-22 ENCOUNTER — Ambulatory Visit: Payer: 59 | Admitting: Physical Therapy

## 2019-07-22 ENCOUNTER — Encounter: Payer: Self-pay | Admitting: Physical Therapy

## 2019-07-22 DIAGNOSIS — R293 Abnormal posture: Secondary | ICD-10-CM | POA: Diagnosis not present

## 2019-07-22 DIAGNOSIS — D0502 Lobular carcinoma in situ of left breast: Secondary | ICD-10-CM

## 2019-07-22 NOTE — Therapy (Signed)
[place], [place], [address] Phone: [phone]   Fax:  [phone]  Physical Therapy Treatment  Patient Details  Name: Sarah [name] MRN: [id number] Date of Birth: Dec 08, 1970 Referring Provider (PT): Ave Filter Date: 07/22/2019   PT End of Session - 07/22/19 1134    Visit Number 2    Number of Visits 2    Date for PT Re-Evaluation 09/04/19    PT Start Time 1110    PT Stop Time 1128    PT Time Calculation (min) 18 min    Activity Tolerance Treatment limited secondary to medical complications (Comment);Other (comment)   too soon after surgery to begin treatment          Past Medical History:  Diagnosis Date  . ANA positive    ?Sjogrens  . Breast neoplasm, Tis (LCIS), left   . Cholecystitis   . Cholelithiasis   . Dry eye   . Fibroid   . Gall stones   . GERD (gastroesophageal reflux disease)   . Hypothyroidism    Takes synthroid  . Infertility, female   . Keratitis 2020  . Lactose intolerance   . Leukopenia   . Sjogren's disease ([place])   . Thrombocytopenia ([place])   . Urticaria    episodes since childhood   . Vitamin D deficiency disease    Takes Vit D  . Weight loss     Past Surgical History:  Procedure Laterality Date  . CHOLECYSTECTOMY N/A 07/13/2014   Procedure: LAPAROSCOPIC CHOLECYSTECTOMY WITH INTRAOPERATIVE CHOLANGIOGRAM;  Surgeon: Rolm Bookbinder, MD;  Location: [facility];  Service: General;  Laterality: N/A;  . egg retrieval  2008; 2011; 2012   multiple IVF cycles  . ENDOMETRIAL BIOPSY    . SIMPLE MASTECTOMY WITH AXILLARY SENTINEL NODE BIOPSY Bilateral 07/15/2019   Procedure: RIGHT RISK REDUCING MASTECTOMY AND LEFT MASTECTOMY  WITH LEFT AXILLARY SENTINEL NODE BIOPSY;  Surgeon: Rolm Bookbinder, MD;  Location: [facility];  Service: General;  Laterality: Bilateral;  PEC BLOCK  . WISDOM TOOTH EXTRACTION      There were no vitals filed for this visit.   Subjective Assessment  - 07/22/19 1112    Subjective The left side is very painful. It hurts to move. It hurts in the shoulder and around the shoulder blade. It hurts down my arm. I still have a drain in the right side. I have been doing the exercises the best that I can. (therapist educated pt to hold off on exercises until after drains have been removed for a week)    Pertinent History L breast neoplasm LCIS, 07/15/19- will undergo bilateral mastectomy with L SLND, sjogrens disease/lupus    Patient Stated Goals reduce lymphedema risk and learn post op shoulder ex    Currently in Pain? Yes    Pain Score 1     Pain Location Back    Pain Orientation Lower;Left                                     PT Education - 07/22/19 1132    Education Details Stop all post op exercises since they should not be done until about a week after the last drain is removed. Educated pt not to raise arms above shoulder height. Educated pt in gentle pendulum swings on R arm in standing to help decrease discomfort and encouraged pt to flex/extend LUE at elbow to  help decrease discomfort from cording.    Person(s) Educated Patient    Methods Explanation    Comprehension Verbalized understanding               PT Long Term Goals - 07/10/19 0923      PT LONG TERM GOAL #1   Title Pt will demonstrate she has regained full shoulder ROM and function post operatively compared to baselines.    Time 8    Period Weeks    Status New    Target Date 09/04/19                 Plan - 07/22/19 1135    Clinical Impression Statement Pt had rescheduled post op appointment originally scheduled for 08/06/19 to today. It has been 1 week since pt underwent a bilateral mastectomy and L SLNB. Pt is having increased pain in L arm and shoulder. She reports she has been doing her post op exercises but has not been cleared by the surgeon to begin. Educated pt to stop doing post op exercises since she still has a drain in place and  post op exercises should not be done until about a week after the drain has been removed. Also educated pt to not lift arm about shoulder height until drains are removed. No ROM measurements taken today since it is too soon and drains are still in place. Pt walked in holding LUE in guarded postion which was leading to incrased upper trap and levator pain on L side. Educated pt to let arm rest at her side and keep shoulder relaxed. Educated pt she can do gentle shoulder retraction to help decrease shoulder pain and also standing pendulums to help decrease shoulder discomfort. Pt is having pain down entire LUE and cording is palpable. Educated pt to extend/flex elbow to decrease tightness. Made pt a follow up appointment next Friday pending the last drain is removed tomorrow.    PT Frequency --   eval and 1 f/u and L dex screenings every 3 months   PT Duration 8 weeks    PT Next Visit Plan recert for 2x/wk or as needed, remeasure baselines and begin gentle PROM to L shoulder and manual therapy for cording- make sure pt has had drains removed for a least a week    PT Home Exercise Plan post op breast exercises to begin 1 week after drains are removed    Consulted and Agree with Plan of Care Patient           Patient will benefit from skilled therapeutic intervention in order to improve the following deficits and impairments:  Postural dysfunction, Decreased knowledge of precautions  Visit Diagnosis: Abnormal posture  Lobular carcinoma in situ (LCIS) of left breast     Problem List Patient Active Problem List   Diagnosis Date Noted  . Breast neoplasm, Tis (LCIS) 07/15/2019  . Genetic testing 07/01/2019  . Breast neoplasm, Tis (LCIS), left 06/15/2019  . Undifferentiated connective tissue disease ([place]) 03/16/2019  . Sicca syndrome ([place]) 02/23/2019  . Vitamin D deficiency 07/17/2018  . Pancytopenia ([place]) 07/02/2017  . Fibroid uterus 06/21/2017  . Chronic urticaria 05/06/2014  . Autoimmune  disorder ([place]) 04/08/2012  . Hypothyroidism 04/08/2012  . Rhinitis 04/08/2012  . ANA positive 04/08/2012  . Infertility, female 11/13/2010  . Thrombocytopenia ([place]) 11/13/2010    Allyson Sabal [facility] 07/22/2019, 11:41 AM  [place], [place], [address] Phone: [phone]   Fax:  [phone]  Name: Sarah [name] MRN: [id number] Date of Birth: 1970/11/20  Manus Gunning, PT 07/22/19 11:41 AM

## 2019-07-23 ENCOUNTER — Ambulatory Visit: Payer: 59

## 2019-07-28 ENCOUNTER — Ambulatory Visit (INDEPENDENT_AMBULATORY_CARE_PROVIDER_SITE_OTHER): Payer: 59 | Admitting: Family Medicine

## 2019-07-28 ENCOUNTER — Encounter: Payer: Self-pay | Admitting: Family Medicine

## 2019-07-28 ENCOUNTER — Other Ambulatory Visit: Payer: Self-pay

## 2019-07-28 VITALS — BP 120/68 | HR 69 | Temp 98.4°F | Ht 63.0 in | Wt 113.2 lb

## 2019-07-28 DIAGNOSIS — Z9013 Acquired absence of bilateral breasts and nipples: Secondary | ICD-10-CM | POA: Diagnosis not present

## 2019-07-28 DIAGNOSIS — M359 Systemic involvement of connective tissue, unspecified: Secondary | ICD-10-CM | POA: Diagnosis not present

## 2019-07-28 DIAGNOSIS — D61818 Other pancytopenia: Secondary | ICD-10-CM | POA: Diagnosis not present

## 2019-07-28 DIAGNOSIS — D259 Leiomyoma of uterus, unspecified: Secondary | ICD-10-CM | POA: Diagnosis not present

## 2019-07-28 DIAGNOSIS — E039 Hypothyroidism, unspecified: Secondary | ICD-10-CM | POA: Diagnosis not present

## 2019-07-28 DIAGNOSIS — D0502 Lobular carcinoma in situ of left breast: Secondary | ICD-10-CM | POA: Diagnosis not present

## 2019-07-28 DIAGNOSIS — Z0000 Encounter for general adult medical examination without abnormal findings: Secondary | ICD-10-CM

## 2019-07-28 NOTE — Progress Notes (Signed)
Subjective  Chief Complaint  Patient presents with   Annual Exam    HPI: Sarah [name] is a 49 y.o. female who presents to [facility] today for a Female Wellness Visit. She also has the concerns and/or needs as listed above in the chief complaint. These will be addressed in addition to the Health Maintenance Visit.   Wellness Visit: annual visit with health maintenance review and exam without Pap   HM: all up to date. Reviewed recent gyn wellness exam Chronic disease f/u and/or acute problem visit: (deemed necessary to be done in addition to the wellness visit):  Breast cancer: new dx; 2 weeks bilateral preventive mastectomy on left and mastectomy for localized DCIS on right. Recovering ok. Has some left arm cording and undergoing PT to help.   Autoimmune disease on prednisone: keratitis improves with pred but when weans, posterior headaches resume. Weight loss now thought secondary to chronic autoimmune condition. On plaquenil.   Fibroid uterus.   Monitoring blood counts.  H.pylori gastritis by endoscopy on meds.   Assessment  1. Annual physical exam   2. Breast neoplasm, Tis (LCIS), left   3. Undifferentiated connective tissue disease ([place])   4. Pancytopenia ([place])   5. Acquired hypothyroidism   6. Uterine leiomyoma, unspecified location   7. S/P bilateral mastectomy      Plan  Female Wellness Visit:  Age appropriate Health Maintenance and Prevention measures were discussed with patient. Included topics are cancer screening recommendations, ways to keep healthy (see AVS) including dietary and exercise recommendations, regular eye and dental care, use of seat belts, and avoidance of moderate alcohol use and tobacco use.   BMI: discussed patient's BMI and encouraged positive lifestyle modifications to help get to or maintain a target BMI.  HM needs and immunizations were addressed and ordered. See below for orders. See HM and immunization section for  updates.  Routine labs and screening tests ordered including cmp, cbc and lipids where appropriate.  Discussed recommendations regarding Vit D and calcium supplementation (see AVS)  Chronic disease management visit and/or acute problem visit:  Breast cancer s/p mastectomy. Recovering and PT. Has f/u scheduled. Coping fairly well - it has been difficult.   Autoimmune disease: on low dose pred and plaquenil. rec after recovery from surgery, if unable to wean pred, then rheum to consider brain imaging.   Recent labs reviewed.   Follow up: 12 months for cpe  No orders of the defined types were placed in this encounter.  No orders of the defined types were placed in this encounter.     Lifestyle: Body mass index is 20.05 kg/m. Wt Readings from Last 3 Encounters:  07/28/19 113 lb 3.2 oz (51.3 kg)  07/15/19 110 lb (49.9 kg)  07/14/19 111 lb 8 oz (50.6 kg)    Patient Active Problem List   Diagnosis Date Noted   S/P bilateral mastectomy 07/29/2019   Genetic testing 07/01/2019    Negative genetic testing. No pathogenic variants identified on the [facility] Breast Cancer STAT Panel + Multi-Cancer Panel. VUS in POLE called c.2113C>T identified. The report date is 07/01/2019.  The STAT Breast cancer panel offered by [facility] includes sequencing and rearrangement analysis for the following 9 genes:  ATM, BRCA1, BRCA2, CDH1, CHEK2, PALB2, PTEN, STK11 and TP53.    The Multi-Cancer Panel offered by [facility] includes sequencing and/or deletion duplication testing of the following 85 genes: AIP, ALK, APC, ATM, AXIN2,BAP1,  BARD1, BLM, BMPR1A, BRCA1, BRCA2, BRIP1, CASR, CDC73,  CDH1, CDK4, CDKN1B, CDKN1C, CDKN2A (p14ARF), CDKN2A (p16INK4a), CEBPA, CHEK2, CTNNA1, DICER1, DIS3L2, EGFR (c.2369C>T, p.Thr790Met variant only), EPCAM (Deletion/duplication testing only), FH, FLCN, GATA2, GPC3, GREM1 (Promoter region deletion/duplication testing only), HOXB13 (c.251G>A, p.Gly84Glu), HRAS, KIT, MAX, MEN1, MET,  MITF (c.952G>A, p.Glu318Lys variant only), MLH1, MSH2, MSH3, MSH6, MUTYH, NBN, NF1, NF2, NTHL1, PALB2, PDGFRA, PHOX2B, PMS2, POLD1, POLE, POT1, PRKAR1A, PTCH1, PTEN, RAD50, RAD51C, RAD51D, RB1, RECQL4, RET, RNF43, RUNX1, SDHAF2, SDHA (sequence changes only), SDHB, SDHC, SDHD, SMAD4, SMARCA4, SMARCB1, SMARCE1, STK11, SUFU, TERC, TERT, TMEM127, TP53, TSC1, TSC2, VHL, WRN and WT1.      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     Vitamin D deficiency 07/17/2018   Pancytopenia ([place]) 07/02/2017   Fibroid uterus 06/21/2017    Asymptomatic; found during infertility eval    Chronic urticaria 05/06/2014   Autoimmune disorder ([place]) 04/08/2012    ? Sjogren's or other- referred to rheum 2019    Hypothyroidism 04/08/2012   Rhinitis 04/08/2012   ANA positive 04/08/2012   Infertility, female 11/13/2010   Thrombocytopenia ([place]) 11/13/2010    Overview:  Evaluated by heme onc    Health Maintenance  Topic Date Due   INFLUENZA VACCINE  08/30/2019   PAP SMEAR-Modifier  06/22/2022   TETANUS/TDAP  07/16/2028   COVID-19 Vaccine  Completed   Hepatitis C Screening  Completed   HIV Screening  Completed   Immunization History  Administered Date(s) Administered   Influenza-Unspecified 11/13/2010, 12/14/2011   PFIZER SARS-COV-2 Vaccination 01/14/2019, 02/11/2019   Tdap 07/17/2018   We updated and reviewed the patient's past history in detail and it is documented below. Allergies: Patient is allergic to lactose intolerance (gi), lifitegrast, latex, and tape. Past Medical History Patient  has a past medical history of ANA positive, Breast neoplasm, Tis (LCIS), left,  Cholecystitis, Cholelithiasis, Dry eye, Fibroid, Gall stones, GERD (gastroesophageal reflux disease), Hypothyroidism, Infertility, female, Keratitis (2020), Lactose intolerance, Leukopenia, Sjogren's disease ([place]), Thrombocytopenia ([place]), Urticaria, Vitamin D deficiency disease, and Weight loss. Past Surgical History Patient  has a past surgical history that includes Wisdom tooth extraction; Endometrial biopsy; Cholecystectomy (N/A, 07/13/2014); egg retrieval (2008; 2011; 2012); and Simple mastectomy with axillary sentinel node biopsy (Bilateral, 07/15/2019). Family History: Patient family history includes Lupus in her sister; Thyroid disease in her mother and sister. Social History:  Patient  reports that she has never smoked. She has never used smokeless tobacco. She reports that she does not drink alcohol and does not use drugs.  Review of Systems: Constitutional: negative for fever or malaise Ophthalmic: negative for photophobia, double vision or loss of vision Cardiovascular: negative for chest pain, dyspnea on exertion, or new LE swelling Respiratory: negative for SOB or persistent cough Gastrointestinal: negative for abdominal pain, change in bowel habits or melena Genitourinary: negative for dysuria or gross hematuria, no abnormal uterine bleeding or disharge Musculoskeletal: negative for new gait disturbance or muscular weakness Integumentary: negative for new or persistent rashes, no breast lumps Neurological: negative for TIA or stroke symptoms Psychiatric: negative for SI or delusions Allergic/Immunologic: negative for hives  Patient Care Team    Relationship Specialty Notifications Start End  Leamon Arnt, MD PCP - General Family Medicine  06/21/17   Allena Katz  J, MD Consulting Physician Allergy and Immunology  06/21/17   Gavin Pound, MD Consulting Physician Rheumatology  02/23/19     Objective  Vitals: BP 120/68    Pulse 69    Temp 98.4 F (36.9 C) (Temporal)    Ht 5'  3" (1.6 m)    Wt 113 lb 3.2 oz (51.3 kg)    SpO2 99%    BMI 20.05 kg/m  General:  Well developed, well nourished, no acute distress  Psych:  Alert and orientedx3,normal mood and affect HEENT:  Normocephalic, atraumatic, non-icteric sclera,  supple neck without adenopathy, mass or thyromegaly Cardiovascular:  Normal S1, S2, RRR without gallop, rub or murmur Respiratory:  Good breath sounds bilaterally, CTAB with normal respiratory effort Gastrointestinal: normal bowel sounds, soft, non-tender, no noted masses. No HSM MSK: no deformities, contusions. Joints are without erythema or swelling.  Skin:  Warm, no rashes or suspicious lesions noted  Commons side effects, risks, benefits, and alternatives for medications and treatment plan prescribed today were discussed, and the patient expressed understanding of the given instructions. Patient is instructed to call or message via MyChart if he/she has any questions or concerns regarding our treatment plan. No barriers to understanding were identified. We discussed Red Flag symptoms and signs in detail. Patient expressed understanding regarding what to do in case of urgent or emergency type symptoms.   Medication list was reconciled, printed and provided to the patient in AVS. Patient instructions and summary information was reviewed with the patient as documented in the AVS. This note was prepared with assistance of Dragon voice recognition software. Occasional wrong-word or sound-a-like substitutions may have occurred due to the inherent limitations of voice recognition software  This visit occurred during the SARS-CoV-2 public health emergency.  Safety protocols were in place, including screening questions prior to the visit, additional usage of staff PPE, and extensive cleaning of exam room while observing appropriate contact time as indicated for disinfecting solutions.

## 2019-07-28 NOTE — Patient Instructions (Signed)
Please return in 12 months for your annual complete physical; please come fasting.  Please take good care of yourself during your recovery. Take the time that you need!  If you have any questions or concerns, please don't hesitate to send me a message via MyChart or call the office at [phone]. Thank you for visiting with [place] today! It's our pleasure caring for you.

## 2019-07-29 DIAGNOSIS — H04129 Dry eye syndrome of unspecified lacrimal gland: Secondary | ICD-10-CM | POA: Diagnosis not present

## 2019-07-29 DIAGNOSIS — Z8669 Personal history of other diseases of the nervous system and sense organs: Secondary | ICD-10-CM | POA: Diagnosis not present

## 2019-07-29 DIAGNOSIS — Z9013 Acquired absence of bilateral breasts and nipples: Secondary | ICD-10-CM | POA: Insufficient documentation

## 2019-07-29 DIAGNOSIS — H018 Other specified inflammations of eyelid: Secondary | ICD-10-CM | POA: Diagnosis not present

## 2019-07-31 ENCOUNTER — Ambulatory Visit: Payer: 59 | Attending: General Surgery | Admitting: Rehabilitation

## 2019-07-31 ENCOUNTER — Encounter: Payer: Self-pay | Admitting: Rehabilitation

## 2019-07-31 ENCOUNTER — Other Ambulatory Visit: Payer: Self-pay

## 2019-07-31 DIAGNOSIS — D0502 Lobular carcinoma in situ of left breast: Secondary | ICD-10-CM | POA: Diagnosis not present

## 2019-07-31 DIAGNOSIS — M25612 Stiffness of left shoulder, not elsewhere classified: Secondary | ICD-10-CM | POA: Insufficient documentation

## 2019-07-31 DIAGNOSIS — M25611 Stiffness of right shoulder, not elsewhere classified: Secondary | ICD-10-CM | POA: Diagnosis not present

## 2019-07-31 DIAGNOSIS — R293 Abnormal posture: Secondary | ICD-10-CM | POA: Diagnosis not present

## 2019-07-31 DIAGNOSIS — Z483 Aftercare following surgery for neoplasm: Secondary | ICD-10-CM | POA: Diagnosis not present

## 2019-07-31 DIAGNOSIS — Z9013 Acquired absence of bilateral breasts and nipples: Secondary | ICD-10-CM | POA: Diagnosis not present

## 2019-07-31 NOTE — Therapy (Signed)
[place], [place], [address] Phone: [phone]   Fax:  [phone]  Physical Therapy Treatment  Patient Details  Name: Sarah [name] MRN: [id number] Date of Birth: 22-Jul-1970 Referring Provider (PT): Donne Hazel   Encounter Date: 07/31/2019   PT End of Session - 07/31/19 8937    Visit Number 3    Number of Visits 15    Date for PT Re-Evaluation 09/11/19    PT Start Time 1100    PT Stop Time 1155    PT Time Calculation (min) 55 min    Activity Tolerance Patient tolerated treatment well    Behavior During Therapy [facility] for tasks assessed/performed           Past Medical History:  Diagnosis Date  . ANA positive    ?Sjogrens  . Breast neoplasm, Tis (LCIS), left   . Cholecystitis   . Cholelithiasis   . Dry eye   . Fibroid   . Gall stones   . GERD (gastroesophageal reflux disease)   . Hypothyroidism    Takes synthroid  . Infertility, female   . Keratitis 2020  . Lactose intolerance   . Leukopenia   . Sjogren's disease ([place])   . Thrombocytopenia ([place])   . Urticaria    episodes since childhood   . Vitamin D deficiency disease    Takes Vit D  . Weight loss     Past Surgical History:  Procedure Laterality Date  . CHOLECYSTECTOMY N/A 07/13/2014   Procedure: LAPAROSCOPIC CHOLECYSTECTOMY WITH INTRAOPERATIVE CHOLANGIOGRAM;  Surgeon: Rolm Bookbinder, MD;  Location: [facility];  Service: General;  Laterality: N/A;  . egg retrieval  2008; 2011; 2012   multiple IVF cycles  . ENDOMETRIAL BIOPSY    . SIMPLE MASTECTOMY WITH AXILLARY SENTINEL NODE BIOPSY Bilateral 07/15/2019   Procedure: RIGHT RISK REDUCING MASTECTOMY AND LEFT MASTECTOMY  WITH LEFT AXILLARY SENTINEL NODE BIOPSY;  Surgeon: Rolm Bookbinder, MD;  Location: [facility];  Service: General;  Laterality: Bilateral;  PEC BLOCK  . WISDOM TOOTH EXTRACTION      There were no vitals filed for this visit.   Subjective Assessment - 07/31/19 1059     Subjective I got my drains out and surgeon said I can start    Pertinent History L breast neoplasm LCIS, 07/15/19 will undergo bilateral mastectomy with L SLND with 3 negative lymph nodes removed on the left , sjogrens disease/lupus    Patient Stated Goals reduce lymphedema risk and learn post op shoulder ex    Currently in Pain? Yes    Pain Score 1     Pain Location Arm    Pain Orientation Left    Pain Descriptors / Indicators Aching    Pain Type Surgical pain    Pain Onset 1 to 4 weeks ago    Pain Frequency Constant    Aggravating Factors  straighten the left arm              [facility] PT Assessment - 07/31/19 0001      AROM   Right Shoulder Extension 40 Degrees    Right Shoulder Flexion 131 Degrees   pull   Right Shoulder ABduction 125 Degrees   pull   Right Shoulder Internal Rotation 70 Degrees    Right Shoulder External Rotation 80 Degrees   pull   Left Shoulder Flexion 75 Degrees   pull in the arm   Left Shoulder ABduction 50 Degrees   pain in arm from cording  Left Shoulder External Rotation 70 Degrees   pull                        [facility] Adult PT Treatment/Exercise - 07/31/19 0001      Exercises   Exercises Shoulder      Shoulder Exercises: Stretch   Other Shoulder Stretches supine flexion AAROM with opposite UE x 5, butterfly stretch bil supine, seated median nerve/cording stretch with arm abduction, extension, and wrist flossing x 10 right at pain in arm point      Manual Therapy   Manual Therapy Passive ROM;Soft tissue mobilization    Manual therapy comments rechecked AROM    Soft tissue mobilization to the forearm, antecubital fossa, and into the upper arm petrissage and skin stretching longitundinally to release cords mild pressure due to first session. Then seated trigger point release and STM to the upper trapezius     Passive ROM to the left shoulder into flexion, and abduction gently with focus on minimizing pectoralis spasm and vcs for relaxation                   PT Education - 07/31/19 1204    Education Details POC, cording anatomy and treatment               PT Long Term Goals - 07/31/19 1210      PT LONG TERM GOAL #1   Title Pt will demonstrate she has regained full shoulder ROM and function post operatively compared to baselines.      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  pt tolerated first treatment session well performing PROM supine monitoring incisions for pull which remained minimal with most pain and pull in the left forearm and elbow.  Able to improve abduction AROM from 50-85 during treatment with pt very encouraged by this.  Pt has had success with dry needling in the past when her neck felt like this so added to the POC if needed.    PT Frequency 2x / week    PT Duration 6 weeks    PT Treatment/Interventions ADLs/Self Care Home Management;Patient/family education;Therapeutic exercise;Neuromuscular re-education;Manual lymph drainage;Manual techniques;Passive range of motion;Dry needling;Taping    PT Next Visit Plan continue Lt UE cording release, AAROM, PROM Lt>Rt, STM and trigger point release to the neck and scapular muscles PRN    PT Home Exercise Plan post op breast exercises to begin 1 week after  drains are  removed    Consulted and Agree with Plan of Care Patient           Patient will benefit from skilled therapeutic intervention in order to improve the following deficits and impairments:     Visit Diagnosis: Abnormal posture  Lobular carcinoma in situ (LCIS) of left breast  Stiffness of left shoulder, not elsewhere classified  Stiffness of right shoulder, not elsewhere classified  History of bilateral mastectomy     Problem List Patient Active Problem List   Diagnosis Date Noted  . S/P bilateral mastectomy 07/29/2019  . Genetic testing 07/01/2019  . 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 Undifferentiated connective tissue disease ([place]) 03/16/2019  . Sicca syndrome ([place]) 02/23/2019  . Vitamin D deficiency 07/17/2018  . Pancytopenia ([place]) 07/02/2017  . Fibroid uterus 06/21/2017  . Chronic urticaria 05/06/2014  . Autoimmune disorder ([place]) 04/08/2012  . Hypothyroidism 04/08/2012  . Rhinitis 04/08/2012  . ANA positive 04/08/2012  . Infertility, female 11/13/2010  . Thrombocytopenia ([place]) 11/13/2010    Stark Bray 07/31/2019, 12:12 PM  [place], [place], [address] Phone: [phone]   Fax:  [phone]  Name: Eldana Isip MRN: [id number] Date of Birth: [birth year]

## 2019-08-04 MED FILL — predniSONE 1 MG TABS: 1 | 30 days supply | Qty: 60 | Fill #1

## 2019-08-04 MED FILL — CEQUA 0.09 % SOLN: 0.09 | 30 days supply | Qty: 60 | Fill #2

## 2019-08-05 ENCOUNTER — Ambulatory Visit: Payer: 59

## 2019-08-05 ENCOUNTER — Other Ambulatory Visit: Payer: Self-pay

## 2019-08-05 DIAGNOSIS — R293 Abnormal posture: Secondary | ICD-10-CM

## 2019-08-05 DIAGNOSIS — Z9013 Acquired absence of bilateral breasts and nipples: Secondary | ICD-10-CM

## 2019-08-05 DIAGNOSIS — M25612 Stiffness of left shoulder, not elsewhere classified: Secondary | ICD-10-CM

## 2019-08-05 DIAGNOSIS — M25611 Stiffness of right shoulder, not elsewhere classified: Secondary | ICD-10-CM

## 2019-08-05 DIAGNOSIS — Z483 Aftercare following surgery for neoplasm: Secondary | ICD-10-CM | POA: Diagnosis not present

## 2019-08-05 DIAGNOSIS — D0502 Lobular carcinoma in situ of left breast: Secondary | ICD-10-CM | POA: Diagnosis not present

## 2019-08-05 MED FILL — HYDROXYCHLOROQUINE SULFATE: 200 | 30 days supply | Qty: 45 | Fill #3

## 2019-08-05 NOTE — Therapy (Signed)
[place], [place], [address] Phone: [phone]   Fax:  [phone]  Physical Therapy Treatment  Patient Details  Name: Sarah [name] MRN: [id number] Date of Birth: 03/09/1970 Referring Provider (PT): Ave Filter Date: 08/05/2019   PT End of Session - 08/05/19 0930    Visit Number 4    Number of Visits 15    Date for PT Re-Evaluation 09/11/19    PT Start Time 0803    PT Stop Time 0912    PT Time Calculation (min) 69 min    Activity Tolerance Patient tolerated treatment well    Behavior During Therapy [facility] for tasks assessed/performed           Past Medical History:  Diagnosis Date  . ANA positive    ?Sjogrens  . Breast neoplasm, Tis (LCIS), left   . Cholecystitis   . Cholelithiasis   . Dry eye   . Fibroid   . Gall stones   . GERD (gastroesophageal reflux disease)   . Hypothyroidism    Takes synthroid  . Infertility, female   . Keratitis 2020  . Lactose intolerance   . Leukopenia   . Sjogren's disease ([place])   . Thrombocytopenia ([place])   . Urticaria    episodes since childhood   . Vitamin D deficiency disease    Takes Vit D  . Weight loss     Past Surgical History:  Procedure Laterality Date  . CHOLECYSTECTOMY N/A 07/13/2014   Procedure: LAPAROSCOPIC CHOLECYSTECTOMY WITH INTRAOPERATIVE CHOLANGIOGRAM;  Surgeon: Rolm Bookbinder, MD;  Location: [facility];  Service: General;  Laterality: N/A;  . egg retrieval  2008; 2011; 2012   multiple IVF cycles  . ENDOMETRIAL BIOPSY    . SIMPLE MASTECTOMY WITH AXILLARY SENTINEL NODE BIOPSY Bilateral 07/15/2019   Procedure: RIGHT RISK REDUCING MASTECTOMY AND LEFT MASTECTOMY  WITH LEFT AXILLARY SENTINEL NODE BIOPSY;  Surgeon: Rolm Bookbinder, MD;  Location: [facility];  Service: General;  Laterality: Bilateral;  PEC BLOCK  . WISDOM TOOTH EXTRACTION      There were no vitals filed for this visit.   Subjective Assessment - 08/05/19 0805     Subjective My Rt shoulder is 70-80% better, but my Lt is struggling. It felt better after the last visit, but the tightness came back.    Pertinent History L breast neoplasm LCIS, 07/15/19 will undergo bilateral mastectomy with L SLND with 3 negative lymph nodes removed on the left , sjogrens disease/lupus    Patient Stated Goals reduce lymphedema risk and learn post op shoulder ex    Currently in Pain? Yes   just pain in Lt arm cording when stretched   Pain Score 3     Pain Location Back    Pain Orientation Upper    Pain Descriptors / Indicators Other (Comment)   annoying   Pain Type Surgical pain    Pain Onset 1 to 4 weeks ago    Pain Frequency Intermittent    Aggravating Factors  positional    Pain Relieving Factors massage                             [facility] Adult PT Treatment/Exercise - 08/05/19 0001      Shoulder Exercises: Supine   Other Supine Exercises Towel roll placed along thoracic spine centered between scapulae for following: bil UE horz abd and then bil UE scaption x10 each with pt returning therapist  demo and VCs throughout to relax and focus on depressing Lt>Rt scapula, then to try "snow angel" with UE's staying resting on floor and bringing them into abduction until gentle stretch felt at pects and hold this position when at home ~1 min, rest, then try a little higher each time. Also began instructing her in Meeks verbally and handout issued      Manual Therapy   Manual Therapy Myofascial release;Scapular mobilization;Soft tissue mobilization;Passive ROM    Soft tissue mobilization to the forearm, antecubital fossa, and into the upper arm petrissage and skin stretching longitundinally to release cords with mild pressure due to tenderness from cording; then into Rt S/L for Lt>Rt trigger point release to medial scapular border, also at Lt UT using Biotone lotion    Myofascial Release To Lt axilla and medial aspect of arm at areas of cording; then briefly at Rt  chest wall superior to incision at cording directed towards axilla diagonally     Scapular Mobilization To Lt scapula into retraction/protraction with trigger point release, mobility improved by end of session    Passive ROM to the left shoulder into flexion, er and abduction gently with vcs for relaxation; then same to Rt but briefer as pts P/ROM is much improved on this side                  PT Education - 08/05/19 0923    Education Details Meeks Decompression exercises to assist pt in decreasing postural muscle guarding, along with supine over towel roll as done today. Also encouraged pt to start working in gentle AA/ROM into her ADLs, like when putting dishes away hold onto first shelf with Lt hand and practice relaxing muscles, then try leaning forward for gentle increased stretch, then can work into turn away from shelf for increased pect stretch    Person(s) Educated Patient    Methods Explanation;Demonstration;Handout    Comprehension Verbalized understanding;Returned demonstration;Need further instruction               PT Long Term Goals - 07/31/19 1210      PT LONG TERM GOAL #1   Title Pt will demonstrate she has regained full shoulder ROM and function post operatively compared to baselines.      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 Her Lt UE she reported felt better briefly  after last session but tightness resumed. So most focus spent here today with MFR to cording noted at antecubital fossa, though this seems improved from last noted as pt was able to achieve full elbow extension in near 90 degrees  abduction. Also worked on P/ROM with and without elbow flexion to allow for increased shoulder ROM without pull of cording limiting this. Also STM to Lt>Rt medial scapular border. Overall pt reports feeling much looser after session today and was engaged with learning new HEP and correct technique. Encouraged pt to cont being aware of working to maintain erect posture with less muscle guarding and she verbalized understanding this.    Personal Factors and Comorbidities Comorbidity 1    Comorbidities sjogrens/lupus mixed disease    Stability/Clinical Decision Making Stable/Uncomplicated    Rehab Potential Excellent    PT Frequency 2x / week    PT Duration 6 weeks    PT Treatment/Interventions ADLs/Self Care Home Management;Patient/family education;Therapeutic exercise;Neuromuscular re-education;Manual lymph drainage;Manual techniques;Passive range of motion;Dry needling;Taping    PT Next Visit Plan continue Lt UE cording release, AAROM, PROM Lt>Rt, STM and trigger point release to the neck and scapular muscles PRN; review HEP issued today    PT Home Exercise Plan post op breast exercises; supine over towel roll for gentle UE ROM and Meeks Decompression exs    Consulted and Agree with Plan of Care Patient           Patient will benefit from skilled therapeutic intervention in order to improve the following deficits and impairments:  Postural dysfunction, Decreased knowledge of precautions  Visit Diagnosis: Abnormal posture  Lobular carcinoma in situ (LCIS) of left breast  Stiffness of left shoulder, not elsewhere classified  Stiffness of right shoulder, not elsewhere classified  History of bilateral mastectomy     Problem List Patient Active Problem List    Diagnosis Date Noted  . S/P bilateral mastectomy 07/29/2019  . Genetic testing 07/01/2019  . 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 Undifferentiated connective tissue disease ([place]) 03/16/2019  . Sicca syndrome ([place]) 02/23/2019  . Vitamin D deficiency 07/17/2018  . Pancytopenia ([place]) 07/02/2017  . Fibroid uterus 06/21/2017  . Chronic urticaria 05/06/2014  . Autoimmune disorder ([place]) 04/08/2012  . Hypothyroidism 04/08/2012  . Rhinitis 04/08/2012  . ANA positive 04/08/2012  . Infertility, female 11/13/2010  . Thrombocytopenia ([place]) 11/13/2010    Otelia Limes, PTA 08/05/2019, 9:41 AM  [place], [place], [address] Phone: [phone]   Fax:  [phone]  Name: Sarah [name] MRN: [id number] Date of Birth: 1970/11/15

## 2019-08-05 NOTE — Patient Instructions (Signed)
1. Decompression Exercise     Cancer Rehab [phone]    Lie on back on firm surface, knees bent, feet flat, arms turned up, out to sides, backs of hands down. Time _5-15__ minutes. Can do this over towel roll. Surface: floor   2. Shoulder Press    Start in Decompression Exercise position. Press shoulders downward towards supporting surface. Hold __2-3__ seconds while counting out loud. Repeat _3-5___ times. Do _1-2___ times per day.   3. Head Press    Bring cervical spine (neck) into neutral position (by either tucking the chin towards the chest or tilting the chin upward). Feel weight on back of head. Press head downward into supporting surface.    Hold _2-3__ seconds. Repeat _3-5__ times. Do _1-2__ times per day.   4. Leg Lengthener    Straighten one leg. Pull toes AND forefoot toward knee, extend heel. Lengthen leg by pulling pelvis away from ribs. Hold _2-3__ seconds. Relax. Repeat __4-6__ times. Do other leg.  Surface: floor   5. Leg Press    Straighten one leg down to floor keeping leg aligned with hip. Pull toes AND forefoot toward knee; extend heel.  Press entire leg downward (as if pressing leg into sandy beach). DO NOT BEND KNEE. Hold _2-3__ seconds. Do __4-6__ times. Repeat with other leg.

## 2019-08-06 ENCOUNTER — Ambulatory Visit: Payer: 59 | Admitting: Physical Therapy

## 2019-08-06 ENCOUNTER — Encounter: Payer: Self-pay | Admitting: Physical Therapy

## 2019-08-06 DIAGNOSIS — M25611 Stiffness of right shoulder, not elsewhere classified: Secondary | ICD-10-CM | POA: Diagnosis not present

## 2019-08-06 DIAGNOSIS — D0502 Lobular carcinoma in situ of left breast: Secondary | ICD-10-CM | POA: Diagnosis not present

## 2019-08-06 DIAGNOSIS — R293 Abnormal posture: Secondary | ICD-10-CM | POA: Diagnosis not present

## 2019-08-06 DIAGNOSIS — M25612 Stiffness of left shoulder, not elsewhere classified: Secondary | ICD-10-CM

## 2019-08-06 DIAGNOSIS — Z9013 Acquired absence of bilateral breasts and nipples: Secondary | ICD-10-CM | POA: Diagnosis not present

## 2019-08-06 DIAGNOSIS — Z483 Aftercare following surgery for neoplasm: Secondary | ICD-10-CM | POA: Diagnosis not present

## 2019-08-06 NOTE — Therapy (Signed)
[place], [place], [address] Phone: [phone]   Fax:  [phone]  Physical Therapy Treatment  Patient Details  Name: Sarah [name] MRN: [id number] Date of Birth: 1970-03-03 Referring Provider (PT): Donne Hazel   Encounter Date: 08/06/2019   PT End of Session - 08/06/19 1001    Visit Number 5    Number of Visits 15    Date for PT Re-Evaluation 09/11/19    PT Start Time 0903    PT Stop Time 1000    PT Time Calculation (min) 57 min    Activity Tolerance Patient tolerated treatment well    Behavior During Therapy [facility] for tasks assessed/performed           Past Medical History:  Diagnosis Date  . ANA positive    ?Sjogrens  . Breast neoplasm, Tis (LCIS), left   . Cholecystitis   . Cholelithiasis   . Dry eye   . Fibroid   . Gall stones   . GERD (gastroesophageal reflux disease)   . Hypothyroidism    Takes synthroid  . Infertility, female   . Keratitis 2020  . Lactose intolerance   . Leukopenia   . Sjogren's disease ([place])   . Thrombocytopenia ([place])   . Urticaria    episodes since childhood   . Vitamin D deficiency disease    Takes Vit D  . Weight loss     Past Surgical History:  Procedure Laterality Date  . CHOLECYSTECTOMY N/A 07/13/2014   Procedure: LAPAROSCOPIC CHOLECYSTECTOMY WITH INTRAOPERATIVE CHOLANGIOGRAM;  Surgeon: Rolm Bookbinder, MD;  Location: [facility];  Service: General;  Laterality: N/A;  . egg retrieval  2008; 2011; 2012   multiple IVF cycles  . ENDOMETRIAL BIOPSY    . SIMPLE MASTECTOMY WITH AXILLARY SENTINEL NODE BIOPSY Bilateral 07/15/2019   Procedure: RIGHT RISK REDUCING MASTECTOMY AND LEFT MASTECTOMY  WITH LEFT AXILLARY SENTINEL NODE BIOPSY;  Surgeon: Rolm Bookbinder, MD;  Location: [facility];  Service: General;  Laterality: Bilateral;  PEC BLOCK  . WISDOM TOOTH EXTRACTION      There were no vitals filed for this visit.   Subjective Assessment - 08/06/19 0903     Subjective The arm is improving the result just doesn't persist.    Pertinent History L breast neoplasm LCIS, 07/15/19 will undergo bilateral mastectomy with L SLND with 3 negative lymph nodes removed on the left , sjogrens disease/lupus    Patient Stated Goals reduce lymphedema risk and learn post op shoulder ex    Currently in Pain? Yes    Pain Score 2     Pain Location Arm    Pain Orientation Left    Pain Descriptors / Indicators Aching    Pain Type Acute pain    Pain Onset 1 to 4 weeks ago    Pain Frequency Constant                             [facility] Adult PT Treatment/Exercise - 08/06/19 0001      Manual Therapy   Soft tissue mobilization to the forearm, antecubital fossa, and into the upper arm and axilla skin stretching longitundinally to release cords with mild pressure due to tenderness from cording    Myofascial Release lightly across left trunk and axilla    Passive ROM to left shoulder in to flexion and abduction with prolonged holds to work on cording and PROM moving elbow in to extension  PT Long Term Goals - 07/31/19 1210      PT LONG TERM GOAL #1   Title Pt will demonstrate she has regained full shoulder ROM and function post operatively compared to baselines.      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 Numerous thick cords palpable in left axilla and then numerous  thin cords palpable at antecubital fossa. Focused today on gentle longitudinal stretching and soft tissue mobilization to cording from wrist to axila and along left trunk to pt's tolerance. Pt's PROM increased greatly by end of session and she demonstrated approximately 150 degrees of abduction per visual estimate. At beginning of session she was unable to fully extend elbow and by end of session elbow was fully extended and LUE abducted to around 150 degrees.    PT Frequency 2x / week    PT Duration 6 weeks    PT Treatment/Interventions ADLs/Self Care Home Management;Patient/family education;Therapeutic exercise;Neuromuscular re-education;Manual lymph drainage;Manual techniques;Passive range of motion;Dry needling;Taping    PT Next Visit Plan continue Lt UE cording release, AAROM, PROM Lt>Rt, STM and trigger point release to the neck and scapular muscles PRN; review HEP issued today    PT Home Exercise Plan post op breast exercises; supine over towel roll for gentle UE ROM and Meeks Decompression exs    Consulted and Agree with Plan of Care Patient           Patient will benefit from skilled therapeutic intervention in order to improve the following deficits and impairments:  Postural dysfunction, Decreased knowledge of precautions  Visit Diagnosis: Stiffness of left shoulder, not elsewhere classified  Abnormal posture     Problem List Patient Active Problem List   Diagnosis Date Noted  . S/P bilateral mastectomy 07/29/2019  . Genetic testing 07/01/2019  . 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 Undifferentiated connective tissue disease ([place]) 03/16/2019  . Sicca syndrome ([place]) 02/23/2019  . Vitamin D deficiency 07/17/2018  . Pancytopenia ([place]) 07/02/2017  . Fibroid uterus 06/21/2017  . Chronic urticaria 05/06/2014  . Autoimmune disorder ([place]) 04/08/2012  . Hypothyroidism 04/08/2012  . Rhinitis 04/08/2012  . ANA positive 04/08/2012  .  Infertility, female 11/13/2010  . Thrombocytopenia ([place]) 11/13/2010    Allyson Sabal [facility] 08/06/2019, 12:33 PM  [place], [place], [address] Phone: [phone]   Fax:  [phone]  Name: Sarah [name] MRN: [id number] Date of Birth: 10-29-1970  Manus Gunning, PT 08/06/19 12:33 PM

## 2019-08-10 ENCOUNTER — Ambulatory Visit: Payer: 59

## 2019-08-10 ENCOUNTER — Encounter: Payer: 59 | Admitting: Obstetrics & Gynecology

## 2019-08-10 ENCOUNTER — Other Ambulatory Visit: Payer: Self-pay

## 2019-08-10 DIAGNOSIS — Z9013 Acquired absence of bilateral breasts and nipples: Secondary | ICD-10-CM | POA: Diagnosis not present

## 2019-08-10 DIAGNOSIS — R293 Abnormal posture: Secondary | ICD-10-CM | POA: Diagnosis not present

## 2019-08-10 DIAGNOSIS — Z483 Aftercare following surgery for neoplasm: Secondary | ICD-10-CM | POA: Diagnosis not present

## 2019-08-10 DIAGNOSIS — D0502 Lobular carcinoma in situ of left breast: Secondary | ICD-10-CM

## 2019-08-10 DIAGNOSIS — M25612 Stiffness of left shoulder, not elsewhere classified: Secondary | ICD-10-CM

## 2019-08-10 DIAGNOSIS — M25611 Stiffness of right shoulder, not elsewhere classified: Secondary | ICD-10-CM | POA: Diagnosis not present

## 2019-08-10 NOTE — Therapy (Signed)
[place], [place], [address] Phone: [phone]   Fax:  [phone]  Physical Therapy Treatment  Patient Details  Name: Sarah [name] MRN: [id number] Date of Birth: August 04, 1970 Referring Provider (PT): Donne Hazel   Encounter Date: 08/10/2019   PT End of Session - 08/10/19 0909    Visit Number 6    Number of Visits 15    Date for PT Re-Evaluation 09/11/19    PT Start Time 0804    PT Stop Time 0908    PT Time Calculation (min) 64 min    Activity Tolerance Patient tolerated treatment well    Behavior During Therapy [facility] for tasks assessed/performed           Past Medical History:  Diagnosis Date  . ANA positive    ?Sjogrens  . Breast neoplasm, Tis (LCIS), left   . Cholecystitis   . Cholelithiasis   . Dry eye   . Fibroid   . Gall stones   . GERD (gastroesophageal reflux disease)   . Hypothyroidism    Takes synthroid  . Infertility, female   . Keratitis 2020  . Lactose intolerance   . Leukopenia   . Sjogren's disease ([place])   . Thrombocytopenia ([place])   . Urticaria    episodes since childhood   . Vitamin D deficiency disease    Takes Vit D  . Weight loss     Past Surgical History:  Procedure Laterality Date  . CHOLECYSTECTOMY N/A 07/13/2014   Procedure: LAPAROSCOPIC CHOLECYSTECTOMY WITH INTRAOPERATIVE CHOLANGIOGRAM;  Surgeon: Rolm Bookbinder, MD;  Location: [facility];  Service: General;  Laterality: N/A;  . egg retrieval  2008; 2011; 2012   multiple IVF cycles  . ENDOMETRIAL BIOPSY    . SIMPLE MASTECTOMY WITH AXILLARY SENTINEL NODE BIOPSY Bilateral 07/15/2019   Procedure: RIGHT RISK REDUCING MASTECTOMY AND LEFT MASTECTOMY  WITH LEFT AXILLARY SENTINEL NODE BIOPSY;  Surgeon: Rolm Bookbinder, MD;  Location: [facility];  Service: General;  Laterality: Bilateral;  PEC BLOCK  . WISDOM TOOTH EXTRACTION      There were no vitals filed for this visit.   Subjective Assessment - 08/10/19 0809     Subjective I am definitely improving. I now can fully straighten my elbow and lift my arm higher before feeling the pull.    Pertinent History L breast neoplasm LCIS, 07/15/19 will undergo bilateral mastectomy with L SLND with 3 negative lymph nodes removed on the left , sjogrens disease/lupus    Patient Stated Goals reduce lymphedema risk and learn post op shoulder ex    Currently in Pain? Yes    Pain Score 3     Pain Location Arm    Pain Orientation Left   forearm   Pain Type Acute pain    Pain Onset 1 to 4 weeks ago    Pain Frequency Intermittent    Aggravating Factors  positional    Pain Relieving Factors stretching                             [facility] Adult PT Treatment/Exercise - 08/10/19 0001      Exercises   Exercises Other Exercises    Other Exercises  Standing with OH reach with Lt arm with Rt side bend for Lt lateral trunk stretch and for cording inferior to ribcage. Then same to Rt side. 3x each returning therapist demo, and cuing to "open to the sky" when in end position  for increased stretch.       Manual Therapy   Manual Therapy Soft tissue mobilization;Myofascial release;Passive ROM;Neural Stretch    Soft tissue mobilization to the forearm, antecubital fossa, and into the upper arm and axilla skin stretching longitundinally to release cords with mild-moderate pressure due to tenderness from cording, but was able to tolerate slight increased pressure today    Myofascial Release lightly across left trunk and axilla with end P/ROM stretching    Passive ROM to left shoulder in to flexion and abduction with prolonged holds to work on cording     Herbalist To Lt UE                       PT Long Term Goals - 07/31/19 1210      PT LONG TERM GOAL #1   Title Pt will demonstrate she has regained full shoulder ROM and function post operatively compared to baselines.      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 She is able to now fully extend elbow with A/ROM of shoulder into abduction. Much less palpable cording today and not as superficial. Still present in antecubital fossa, though now lateral and medial aspects only and only palpable ful elbow extension and ~90-100 degrees shoulder flexion. Her end P/ROM overall much improved since last week as well. Still with deeper cording at bil inferior ribcage areas, so added standing OH stretch with side bending in which pt reported feeling good stretches with this where tight. Pt will benefit from continuing with POC at this time.    Personal Factors and Comorbidities Comorbidity 1    Comorbidities sjogrens/lupus mixed disease    Stability/Clinical Decision Making Stable/Uncomplicated    Rehab Potential Excellent    PT Frequency 2x / week    PT Duration 6 weeks    PT Treatment/Interventions ADLs/Self Care Home Management;Patient/family education;Therapeutic exercise;Neuromuscular re-education;Manual lymph drainage;Manual techniques;Passive range of motion;Dry needling;Taping    PT Next Visit Plan Need review of Meeks decompression exs? Cont stretch over towel roll with P/ROM as tolerable; continue Lt UE cording release, AAROM, PROM Lt>Rt, STM and trigger point release to the neck and scapular muscles PRN    PT Home Exercise Plan post op breast exercises; supine over towel roll for gentle UE ROM and Meeks Decompression exs    Consulted and Agree  with Plan of Care Patient           Patient will benefit from skilled therapeutic intervention in order to improve the following deficits and impairments:  Postural dysfunction, Decreased knowledge of precautions  Visit Diagnosis:  Stiffness of left shoulder, not elsewhere classified  Abnormal posture  Lobular carcinoma in situ (LCIS) of left breast  Stiffness of right shoulder, not elsewhere classified  History of bilateral mastectomy     Problem List Patient Active Problem List   Diagnosis Date Noted  . S/P bilateral mastectomy 07/29/2019  . Genetic testing 07/01/2019  . 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 Undifferentiated connective tissue disease ([place]) 03/16/2019  . Sicca syndrome ([place]) 02/23/2019  . Vitamin D deficiency 07/17/2018  . Pancytopenia ([place]) 07/02/2017  . Fibroid uterus 06/21/2017  . Chronic urticaria 05/06/2014  . Autoimmune disorder ([place]) 04/08/2012  . Hypothyroidism 04/08/2012  . Rhinitis 04/08/2012  . ANA positive 04/08/2012  . Infertility, female 11/13/2010  . Thrombocytopenia ([place]) 11/13/2010    Otelia Limes, PTA 08/10/2019, 9:45 AM  [place], [place], [address] Phone: [phone]   Fax:  [phone]  Name: Sarah [name] MRN: [id number] Date of Birth: 1970/09/20

## 2019-08-11 ENCOUNTER — Encounter: Payer: Self-pay | Admitting: Physical Therapy

## 2019-08-11 ENCOUNTER — Ambulatory Visit: Payer: 59 | Admitting: Physical Therapy

## 2019-08-11 DIAGNOSIS — M25612 Stiffness of left shoulder, not elsewhere classified: Secondary | ICD-10-CM

## 2019-08-11 DIAGNOSIS — D0502 Lobular carcinoma in situ of left breast: Secondary | ICD-10-CM | POA: Diagnosis not present

## 2019-08-11 DIAGNOSIS — Z483 Aftercare following surgery for neoplasm: Secondary | ICD-10-CM

## 2019-08-11 DIAGNOSIS — R293 Abnormal posture: Secondary | ICD-10-CM | POA: Diagnosis not present

## 2019-08-11 DIAGNOSIS — M25611 Stiffness of right shoulder, not elsewhere classified: Secondary | ICD-10-CM

## 2019-08-11 DIAGNOSIS — Z9013 Acquired absence of bilateral breasts and nipples: Secondary | ICD-10-CM | POA: Diagnosis not present

## 2019-08-11 NOTE — Therapy (Signed)
[place], [place], [address] Phone: [phone]   Fax:  [phone]  Physical Therapy Treatment  Patient Details  Name: Sarah [name] MRN: [id number] Date of Birth: [birth year] Referring Provider (PT): Donne Hazel   Encounter Date: 08/11/2019   PT End of Session - 08/11/19 1948    Visit Number 7    Number of Visits 15    Date for PT Re-Evaluation 09/11/19    PT Start Time 1884    PT Stop Time 1500    PT Time Calculation (min) 55 min    Activity Tolerance Patient tolerated treatment well    Behavior During Therapy [facility] for tasks assessed/performed           Past Medical History:  Diagnosis Date  . ANA positive    ?Sjogrens  . Breast neoplasm, Tis (LCIS), left   . Cholecystitis   . Cholelithiasis   . Dry eye   . Fibroid   . Gall stones   . GERD (gastroesophageal reflux disease)   . Hypothyroidism    Takes synthroid  . Infertility, female   . Keratitis 2020  . Lactose intolerance   . Leukopenia   . Sjogren's disease ([place])   . Thrombocytopenia ([place])   . Urticaria    episodes since childhood   . Vitamin D deficiency disease    Takes Vit D  . Weight loss     Past Surgical History:  Procedure Laterality Date  . CHOLECYSTECTOMY N/A 07/13/2014   Procedure: LAPAROSCOPIC CHOLECYSTECTOMY WITH INTRAOPERATIVE CHOLANGIOGRAM;  Surgeon: Rolm Bookbinder, MD;  Location: [place];  Service: General;  Laterality: N/A;  . egg retrieval  2008; 2011; 2012   multiple IVF cycles  . ENDOMETRIAL BIOPSY    . SIMPLE MASTECTOMY WITH AXILLARY SENTINEL NODE BIOPSY Bilateral 07/15/2019   Procedure: RIGHT RISK REDUCING MASTECTOMY AND LEFT MASTECTOMY  WITH LEFT AXILLARY SENTINEL NODE BIOPSY;  Surgeon: Rolm Bookbinder, MD;  Location: [place];  Service: General;  Laterality: Bilateral;  PEC BLOCK  . WISDOM TOOTH EXTRACTION      There were no vitals filed for this visit.   Subjective Assessment - 08/11/19 1406     Subjective Pt is still having problems with cording.    Pertinent History L breast neoplasm LCIS, 07/15/19 will undergo bilateral mastectomy with L SLND with 3 negative lymph nodes removed on the left , sjogrens disease/lupus.  Pt had biateral mastectomy with ALnd on 07/16/2019    Currently in Pain? Yes    Pain Score 3     Pain Location Arm    Pain Orientation Left   bilateral abdomen   Pain Descriptors / Indicators Sharp;Aching    Pain Type Acute pain    Pain Onset 1 to 4 weeks ago                             [facility] Adult PT Treatment/Exercise - 08/11/19 0001      Manual Therapy   Manual Therapy Soft tissue mobilization;Myofascial release;Manual Lymphatic Drainage (MLD);Passive ROM    Manual therapy comments applied small tg soft to left arm and pt felt symptomatic relief     Soft tissue mobilization with coconut oil to left forearm, upper arm and axilla with longtudinal pressure on cord     Myofascial Release across cord at axilla and forearm wiht no "popping" perceived     Passive ROM to left shoulder with elbow bent to allow for  more [place] movment with less traction on cords.  Pt able to achieve nearly full PROM of left shoulder in this manner especially at end of session after soft tissue work                        PT Long Term Goals - 07/31/19 1210      PT LONG TERM GOAL #1   Title Pt will demonstrate she has regained full shoulder ROM and function post operatively compared to baselines.      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 Right arm has some to minor degree. Tried tg soft today and pt felt some relief. She will benefit from compression sleeve for work.  Will send script request to MD. Pt gets symptomatic relief with treatment with better ROM though cords persist.    Personal Factors and Comorbidities Comorbidity 1    Comorbidities sjogrens/lupus mixed disease    Stability/Clinical Decision Making Stable/Uncomplicated    Rehab Potential Excellent    PT Frequency 2x / week    PT Duration 6 weeks    PT Treatment/Interventions ADLs/Self Care Home Management;Patient/family education;Therapeutic exercise;Neuromuscular re-education;Manual lymph drainage;Manual techniques;Passive range of motion;Dry needling;Taping    PT Next Visit Plan Need review of [place] decompression exs? Cont stretch over towel roll with P/ROM as tolerable; continue Lt UE cording release, AAROM, PROM Lt>Rt, STM and trigger point release to the neck and scapular muscles PRN    PT Home Exercise Plan post op breast exercises; supine over towel roll for gentle UE ROM and Meeks Decompression exs           Patient will benefit from skilled therapeutic intervention in order to improve the following deficits and impairments:  Postural dysfunction, Decreased knowledge of precautions  Visit Diagnosis: Stiffness of left shoulder, not elsewhere classified  Abnormal posture  Stiffness of right shoulder, not elsewhere classified  Aftercare following surgery for neoplasm     Problem List Patient Active Problem List   Diagnosis Date Noted  . S/P bilateral mastectomy 07/29/2019  . Genetic testing 07/01/2019  . 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 Undifferentiated connective tissue disease ([place]) 03/16/2019  . Sicca syndrome ([place]) 02/23/2019  . Vitamin D deficiency 07/17/2018  . Pancytopenia ([place])  07/02/2017  . Fibroid uterus 06/21/2017  . Chronic urticaria 05/06/2014  . Autoimmune disorder ([place]) 04/08/2012  . Hypothyroidism 04/08/2012  . Rhinitis 04/08/2012  . ANA positive 04/08/2012  . Infertility, female 11/13/2010  . Thrombocytopenia ([place]) 11/13/2010   Donato Heinz. Owens Shark, PT  Norwood Levo 08/11/2019, 7:52 PM  [facility]  [place], [place], [address] Phone: [phone]   Fax:  [phone]  Name: Aloria Looper MRN: [id number] Date of Birth: November 23, 1970

## 2019-08-17 ENCOUNTER — Ambulatory Visit: Payer: 59

## 2019-08-17 ENCOUNTER — Other Ambulatory Visit: Payer: Self-pay

## 2019-08-17 DIAGNOSIS — M25612 Stiffness of left shoulder, not elsewhere classified: Secondary | ICD-10-CM

## 2019-08-17 DIAGNOSIS — R293 Abnormal posture: Secondary | ICD-10-CM

## 2019-08-17 DIAGNOSIS — M25611 Stiffness of right shoulder, not elsewhere classified: Secondary | ICD-10-CM

## 2019-08-17 DIAGNOSIS — Z9013 Acquired absence of bilateral breasts and nipples: Secondary | ICD-10-CM | POA: Diagnosis not present

## 2019-08-17 DIAGNOSIS — D0502 Lobular carcinoma in situ of left breast: Secondary | ICD-10-CM | POA: Diagnosis not present

## 2019-08-17 DIAGNOSIS — Z483 Aftercare following surgery for neoplasm: Secondary | ICD-10-CM

## 2019-08-17 NOTE — Therapy (Signed)
[place], [place], [address] Phone: [phone]   Fax:  [phone]  Physical Therapy Treatment  Patient Details  Name: Sarah [name] MRN: [id number] Date of Birth: February 08, 1970 Referring Provider (PT): Donne Hazel   Encounter Date: 08/17/2019   PT End of Session - 08/17/19 1407    Visit Number 8    Number of Visits 15    Date for PT Re-Evaluation 09/11/19    PT Start Time 1300    PT Stop Time 1407    PT Time Calculation (min) 67 min    Activity Tolerance Patient tolerated treatment well    Behavior During Therapy [facility] for tasks assessed/performed           Past Medical History:  Diagnosis Date  . ANA positive    ?Sjogrens  . Breast neoplasm, Tis (LCIS), left   . Cholecystitis   . Cholelithiasis   . Dry eye   . Fibroid   . Gall stones   . GERD (gastroesophageal reflux disease)   . Hypothyroidism    Takes synthroid  . Infertility, female   . Keratitis 2020  . Lactose intolerance   . Leukopenia   . Sjogren's disease ([place])   . Thrombocytopenia ([place])   . Urticaria    episodes since childhood   . Vitamin D deficiency disease    Takes Vit D  . Weight loss     Past Surgical History:  Procedure Laterality Date  . CHOLECYSTECTOMY N/A 07/13/2014   Procedure: LAPAROSCOPIC CHOLECYSTECTOMY WITH INTRAOPERATIVE CHOLANGIOGRAM;  Surgeon: Rolm Bookbinder, MD;  Location: [place];  Service: General;  Laterality: N/A;  . egg retrieval  2008; 2011; 2012   multiple IVF cycles  . ENDOMETRIAL BIOPSY    . SIMPLE MASTECTOMY WITH AXILLARY SENTINEL NODE BIOPSY Bilateral 07/15/2019   Procedure: RIGHT RISK REDUCING MASTECTOMY AND LEFT MASTECTOMY  WITH LEFT AXILLARY SENTINEL NODE BIOPSY;  Surgeon: Rolm Bookbinder, MD;  Location: [place];  Service: General;  Laterality: Bilateral;  PEC BLOCK  . WISDOM TOOTH EXTRACTION      There were no vitals filed for this visit.   Subjective Assessment - 08/17/19 1315     Subjective I've started noticing swelling in my forearm. My back felt good for a week after you worked on it last time.    Pertinent History L breast neoplasm LCIS, 07/15/19 will undergo bilateral mastectomy with L SLND with 3 negative lymph nodes removed on the left , sjogrens disease/lupus.  Pt had biateral mastectomy with ALnd on 07/16/2019    Patient Stated Goals reduce lymphedema risk and learn post op shoulder ex    Currently in Pain? Yes    Pain Score 3     Pain Location Arm    Pain Orientation Left;Lower    Pain Descriptors / Indicators [place];Tightness;Aching    Pain Type Acute pain    Pain Onset 1 to 4 weeks ago    Pain Frequency Intermittent    Aggravating Factors  recent increased swelling in forearm    Pain Relieving Factors stretching                  L-DEX FLOWSHEETS - 08/17/19 1700      L-DEX LYMPHEDEMA SCREENING   BASELINE SCORE (UNILATERAL) -7.6    L-DEX SCORE (UNILATERAL) 0.3    VALUE CHANGE (UNILAT) 7.9                     [facility] Adult PT Treatment/Exercise -  08/17/19 0001      Manual Therapy   Manual Therapy Myofascial release;Passive ROM;Manual Lymphatic Drainage (MLD)    Myofascial Release across cord at axilla, antecubital fossa and forearm with no "popping" perceived today; then briefly to Rt anterior abdomen inferior to ribs where cording palpable with Rt UE in end ROM flexion stretch    Manual Lymphatic Drainage (MLD) In Supine: Short neck, superficial and deep abdominals, Lt inguinal nodes and Lt axillo-inguinal anastomosis, (did not use anterior inter-axillary due to cording present on RUQ), then Lt UE from lateral upper arm to dorsal hand then retracing all steps beginning to instruct pt in anatomy of lymphatic system throughout.     Passive ROM to left shoulder with elbow bent to allow for more [place] movment with less traction on cords.  Pt able to achieve nearly full PROM of left shoulder in this manner                         PT Long Term Goals - 07/31/19 1210      PT LONG TERM GOAL #1   Title Pt will demonstrate she has regained full shoulder ROM and function post operatively compared to baselines.      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 Her L-Dex score was a change of 7.9 from baseline so discussed her getting into a compression sleeve for next 4 weeks which can also help support her lymphatic system .Questioning if this is pure lymphedema, or side effect from aggressive cording?? Added manual lymph drainage of Lt UE and continued with Lt, fairly aggressive, MFR to all palpable areas of cording. Encouraged her to cont wearing thick stockinette until she gets a compression sleeve (which she will be given one at tomorrows appt).    Personal Factors and Comorbidities Comorbidity 1    Comorbidities sjogrens/lupus mixed disease    Stability/Clinical Decision Making Stable/Uncomplicated    Rehab Potential Excellent    PT Frequency 2x / week    PT Duration 6 weeks    PT Treatment/Interventions ADLs/Self Care Home Management;Patient/family education;Therapeutic exercise;Neuromuscular re-education;Manual lymph drainage;Manual techniques;Passive range of motion;Dry needling;Taping    PT Next Visit Plan Issue compression sleeve and gauntlet for pt to wear for next 4 weeks; encourage her to not overuse Lt UE when returning to work Wednesday if she still does; Cont MLD and P/ROM as tolerable; continue  Lt UE cording release, AAROM, PROM Lt>Rt, STM and trigger point release to the neck and scapular muscles PRN    PT Home Exercise Plan post op breast exercises; supine over towel roll for gentle UE ROM and Meeks Decompression  exs    Consulted and Agree with Plan of Care Patient           Patient will benefit from skilled therapeutic intervention in order to improve the following deficits and impairments:  Postural dysfunction, Decreased knowledge of precautions  Visit Diagnosis: Stiffness of left shoulder, not elsewhere classified  Abnormal posture  Stiffness of right shoulder, not elsewhere classified  Aftercare following surgery for neoplasm  Lobular carcinoma in situ (LCIS) of left breast  History of bilateral mastectomy     Problem List Patient Active Problem List   Diagnosis Date Noted  . S/P bilateral mastectomy 07/29/2019  . Genetic testing 07/01/2019  . 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 Undifferentiated connective tissue disease ([place]) 03/16/2019  . Sicca syndrome ([place]) 02/23/2019  . Vitamin D deficiency 07/17/2018  . Pancytopenia ([place]) 07/02/2017  . Fibroid uterus 06/21/2017  . Chronic urticaria 05/06/2014  . Autoimmune disorder ([place]) 04/08/2012  . Hypothyroidism 04/08/2012  . Rhinitis 04/08/2012  . ANA positive 04/08/2012  . Infertility, female 11/13/2010  . Thrombocytopenia ([place]) 11/13/2010    Otelia Limes, PTA 08/17/2019, 5:49 PM  [facility] [place], [place], [address] Phone: [phone]   Fax:  [phone]  Name: Sarah [name] MRN: [id number] Date of Birth: April 08, 1970

## 2019-08-18 ENCOUNTER — Ambulatory Visit: Payer: 59

## 2019-08-18 DIAGNOSIS — M25611 Stiffness of right shoulder, not elsewhere classified: Secondary | ICD-10-CM

## 2019-08-18 DIAGNOSIS — M25612 Stiffness of left shoulder, not elsewhere classified: Secondary | ICD-10-CM

## 2019-08-18 DIAGNOSIS — Z483 Aftercare following surgery for neoplasm: Secondary | ICD-10-CM | POA: Diagnosis not present

## 2019-08-18 DIAGNOSIS — R293 Abnormal posture: Secondary | ICD-10-CM

## 2019-08-18 DIAGNOSIS — Z9013 Acquired absence of bilateral breasts and nipples: Secondary | ICD-10-CM

## 2019-08-18 DIAGNOSIS — D0502 Lobular carcinoma in situ of left breast: Secondary | ICD-10-CM

## 2019-08-18 NOTE — Therapy (Signed)
[place], [place], [address] Phone: [phone]   Fax:  [phone]  Physical Therapy Treatment  Patient Details  Name: Sarah [name] MRN: [id number] Date of Birth: 02/15/1970 Referring Provider (PT): Ave Filter Date: 08/18/2019   PT End of Session - 08/18/19 1005    Visit Number 9    Number of Visits 15    Date for PT Re-Evaluation 09/11/19    PT Start Time 0901    PT Stop Time 1006    PT Time Calculation (min) 65 min    Activity Tolerance Patient tolerated treatment well    Behavior During Therapy [facility] for tasks assessed/performed           Past Medical History:  Diagnosis Date  . ANA positive    ?Sjogrens  . Breast neoplasm, Tis (LCIS), left   . Cholecystitis   . Cholelithiasis   . Dry eye   . Fibroid   . Gall stones   . GERD (gastroesophageal reflux disease)   . Hypothyroidism    Takes synthroid  . Infertility, female   . Keratitis 2020  . Lactose intolerance   . Leukopenia   . Sjogren's disease ([place])   . Thrombocytopenia ([place])   . Urticaria    episodes since childhood   . Vitamin D deficiency disease    Takes Vit D  . Weight loss     Past Surgical History:  Procedure Laterality Date  . CHOLECYSTECTOMY N/A 07/13/2014   Procedure: LAPAROSCOPIC CHOLECYSTECTOMY WITH INTRAOPERATIVE CHOLANGIOGRAM;  Surgeon: Rolm Bookbinder, MD;  Location: [place];  Service: General;  Laterality: N/A;  . egg retrieval  2008; 2011; 2012   multiple IVF cycles  . ENDOMETRIAL BIOPSY    . SIMPLE MASTECTOMY WITH AXILLARY SENTINEL NODE BIOPSY Bilateral 07/15/2019   Procedure: RIGHT RISK REDUCING MASTECTOMY AND LEFT MASTECTOMY  WITH LEFT AXILLARY SENTINEL NODE BIOPSY;  Surgeon: Rolm Bookbinder, MD;  Location: [place];  Service: General;  Laterality: Bilateral;  PEC BLOCK  . WISDOM TOOTH EXTRACTION      There were no vitals filed for this visit.       [facility] PT Assessment - 08/18/19 0001       AROM   Right Shoulder Flexion 171 Degrees    Right Shoulder ABduction 181 Degrees    Left Shoulder Flexion 152 Degrees    Left Shoulder ABduction 125 Degrees             LYMPHEDEMA/ONCOLOGY QUESTIONNAIRE - 08/18/19 0001      Left Upper Extremity Lymphedema   15 cm Proximal to Olecranon Process 23.6 cm    Olecranon Process 20.4 cm    15 cm Proximal to Ulnar Styloid Process 19.7 cm    10 cm Proximal to Ulnar Styloid Process 17.8 cm    Just Proximal to Ulnar Styloid Process 13.8 cm    Across Hand at [employer] 16.4 cm    At [place] of 2nd Digit 5.3 cm                      [facility] Adult PT Treatment/Exercise - 08/18/19 0001      Manual Therapy   Manual Therapy Edema management;Soft tissue mobilization;Myofascial release;Manual Lymphatic Drainage (MLD);Passive ROM    Edema Management Measured pt for and issued compression sleeve and gauntlet size II for pt to begin wearing daily for next 4 weeks.    Soft tissue mobilization with coconut oil to left upper arm  and axilla with longitudinal pressure on cord (also applied coconut oil to itchy areas of redness inferior to bil incisions to see if this will offer pt relief)    Myofascial Release across cord at axilla, antecubital fossa and forearm; then briefly to Rt anterior abdomen at area of cording but not palpable today    Manual Lymphatic Drainage (MLD) In Supine: Short neck, superficial and deep abdominals, Lt inguinal nodes and Lt axillo-inguinal anastomosis, (did not use anterior inter-axillary due to cording present on RUQ), then Lt UE from lateral upper arm to dorsal hand then retracing all steps beginning to instruct pt in anatomy of lymphatic system throughout.     Passive ROM to left shoulder with elbow bent to allow for more [place] movment with less traction on cords.  Pt able to achieve nearly full PROM of left shoulder in this manner                        PT Long Term Goals - 07/31/19 1210      PT LONG  TERM GOAL #1   Title Pt will demonstrate she has regained full shoulder ROM and function post operatively compared to baselines.      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 Pt agreeable to this as well. Continued with focus on manual therapy working to decrease fascial tightness throughout LUQ and briefly to Rt anterior abdomen. Pts forearm swelling was visibly improved today from yesterday as her tendons at anterior wrist are now visible and were not yesterday. Pt also reports noting difference. 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 Also to take frequent stretch breaks and be aware of posturing during prolong periods of documenting at computer. Pt agreed and verbalized understanding.    Personal Factors and Comorbidities Comorbidity 1    Comorbidities sjogrens/lupus mixed disease    Stability/Clinical Decision Making Stable/Uncomplicated    Rehab Potential Excellent    PT Frequency 2x / week    PT Duration 6 weeks    PT Treatment/Interventions ADLs/Self Care Home Management;Patient/family education;Therapeutic exercise;Neuromuscular re-education;Manual lymph drainage;Manual techniques;Passive range  of motion;Dry needling;Taping    PT Next Visit Plan Issued compression sleeve and gauntlet for pt to wear for next 4 weeks; how was return to work? Add AA/ROM like pulleys and ball roll up wall. Cont MLD and P/ROM as  tolerable; continue Lt UE cording release, AAROM, PROM Lt>Rt, STM and trigger point release to the neck and scapular muscles PRN    PT Home Exercise Plan post op breast exercises; supine over towel roll for gentle UE ROM and Meeks Decompression exs; wear compression sleeve and gauntlet daily    Consulted and Agree with Plan of Care Patient           Patient will benefit from skilled therapeutic intervention in order to improve the following deficits and impairments:  Postural dysfunction, Decreased knowledge of precautions  Visit Diagnosis: Stiffness of left shoulder, not elsewhere classified  Abnormal posture  Stiffness of right shoulder, not elsewhere classified  Aftercare following surgery for neoplasm  Lobular carcinoma in situ (LCIS) of left breast  History of bilateral mastectomy     Problem List Patient Active Problem List   Diagnosis Date Noted  . S/P bilateral mastectomy 07/29/2019  . Genetic testing 07/01/2019  . 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 Undifferentiated connective tissue disease ([place]) 03/16/2019  . 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 Vitamin D deficiency 07/17/2018  . Pancytopenia ([place]) 07/02/2017  . Fibroid uterus 06/21/2017  . Chronic urticaria 05/06/2014  . Autoimmune disorder ([place]) 04/08/2012  . Hypothyroidism 04/08/2012  . Rhinitis 04/08/2012  . ANA positive 04/08/2012  . Infertility, female 11/13/2010  . Thrombocytopenia ([place]) 11/13/2010    Otelia Limes, PTA 08/18/2019, 1:45 PM  [place], [place], [address] Phone: [phone]   Fax:  [phone]  Name: Srihitha Tagliaferri MRN: [id number] Date of Birth:  Feb 19, 1970

## 2019-08-25 ENCOUNTER — Other Ambulatory Visit: Payer: Self-pay

## 2019-08-25 ENCOUNTER — Ambulatory Visit: Payer: 59

## 2019-08-25 DIAGNOSIS — Z9013 Acquired absence of bilateral breasts and nipples: Secondary | ICD-10-CM

## 2019-08-25 DIAGNOSIS — R293 Abnormal posture: Secondary | ICD-10-CM | POA: Diagnosis not present

## 2019-08-25 DIAGNOSIS — D0502 Lobular carcinoma in situ of left breast: Secondary | ICD-10-CM | POA: Diagnosis not present

## 2019-08-25 DIAGNOSIS — M25612 Stiffness of left shoulder, not elsewhere classified: Secondary | ICD-10-CM

## 2019-08-25 DIAGNOSIS — Z483 Aftercare following surgery for neoplasm: Secondary | ICD-10-CM

## 2019-08-25 DIAGNOSIS — M25611 Stiffness of right shoulder, not elsewhere classified: Secondary | ICD-10-CM

## 2019-08-25 NOTE — Therapy (Signed)
[place], [place], [address] Phone: [phone]   Fax:  [phone]  Physical Therapy Treatment  Patient Details  Name: Sarah [name] MRN: [id number] Date of Birth: 1970-04-16 Referring Provider (PT): Ave Filter Date: 08/25/2019   PT End of Session - 08/25/19 1256    Visit Number 10    Number of Visits 15    Date for PT Re-Evaluation 09/11/19    PT Start Time 1112    PT Stop Time 1212    PT Time Calculation (min) 60 min    Activity Tolerance Patient tolerated treatment well    Behavior During Therapy [facility] for tasks assessed/performed           Past Medical History:  Diagnosis Date  . ANA positive    ?Sjogrens  . Breast neoplasm, Tis (LCIS), left   . Cholecystitis   . Cholelithiasis   . Dry eye   . Fibroid   . Gall stones   . GERD (gastroesophageal reflux disease)   . Hypothyroidism    Takes synthroid  . Infertility, female   . Keratitis 2020  . Lactose intolerance   . Leukopenia   . Sjogren's disease ([place])   . Thrombocytopenia ([place])   . Urticaria    episodes since childhood   . Vitamin D deficiency disease    Takes Vit D  . Weight loss     Past Surgical History:  Procedure Laterality Date  . CHOLECYSTECTOMY N/A 07/13/2014   Procedure: LAPAROSCOPIC CHOLECYSTECTOMY WITH INTRAOPERATIVE CHOLANGIOGRAM;  Surgeon: Rolm Bookbinder, MD;  Location: [place];  Service: General;  Laterality: N/A;  . egg retrieval  2008; 2011; 2012   multiple IVF cycles  . ENDOMETRIAL BIOPSY    . SIMPLE MASTECTOMY WITH AXILLARY SENTINEL NODE BIOPSY Bilateral 07/15/2019   Procedure: RIGHT RISK REDUCING MASTECTOMY AND LEFT MASTECTOMY  WITH LEFT AXILLARY SENTINEL NODE BIOPSY;  Surgeon: Rolm Bookbinder, MD;  Location: [place];  Service: General;  Laterality: Bilateral;  PEC BLOCK  . WISDOM TOOTH EXTRACTION      There were no vitals filed for this visit.   Subjective Assessment - 08/25/19 1117     Subjective Overall my soreness is getting better. My first 12 hr day back to work was really hard so I changed my days to 8 hrs and that was much better. I wore the compression sleeve but it was a lttle big and didn't contain my arm much by the end of the day. So I got a smaller one my friend had but its a bit tight.    Pertinent History L breast neoplasm LCIS, 07/15/19 will undergo bilateral mastectomy with L SLND with 3 negative lymph nodes removed on the left , sjogrens disease/lupus.  Pt had biateral mastectomy with ALnd on 07/16/2019    Patient Stated Goals reduce lymphedema risk and learn post op shoulder ex    Currently in Pain? Yes    Pain Score 3     Pain Location Arm    Pain Orientation Left;Lower    Pain Descriptors / Indicators Aching    Pain Type Acute pain    Pain Onset 1 to 4 weeks ago    Pain Frequency Intermittent    Aggravating Factors  end of day is worse    Pain Relieving Factors stretching and wearing compression  [facility] Adult PT Treatment/Exercise - 08/25/19 0001      Shoulder Exercises: Pulleys   Flexion 2 minutes    Flexion Limitations VCs and demo for technique    ABduction 2 minutes    ABduction Limitations Pt returned therapist demo      Manual Therapy   Soft tissue mobilization With coconut oil to Lt upper trap to area of tightness    Myofascial Release across cord at axilla, antecubital fossa and forearm though these were much improved today with less limitation of end ROM; then gentle scar tissue massage just using fingerpads for horizontal stetches along bil incisions (pt received okay for this from Dr. Donne Hazel at appt this morning)    Manual Lymphatic Drainage (MLD) In Supine: Short neck, superficial and deep abdominals, Lt inguinal nodes and Lt axillo-inguinal anastomosis, (did not use anterior inter-axillary due to cording present on RUQ), then Lt UE from lateral upper arm to dorsal hand then retracing all steps.     Passive ROM In Supine to Lt shoulder into flexion, abduction and D2. Able to keep elbow extended today due to less cording now, much improved P/ROM                       PT Long Term Goals - 07/31/19 1210      PT LONG TERM GOAL #1   Title Pt will demonstrate she has regained full shoulder ROM and function post operatively compared to baselines.      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 Her P/ROM is progressing very well as is her cording. It is still present but mch less limiting in general at bil anterior abdomen and Lt UE.    Personal Factors and Comorbidities Comorbidity 1    Comorbidities sjogrens/lupus mixed disease    Stability/Clinical Decision Making Stable/Uncomplicated    Rehab Potential Excellent    PT Frequency 2x / week    PT Duration 6 weeks    PT Treatment/Interventions ADLs/Self Care Home Management;Patient/family education;Therapeutic exercise;Neuromuscular re-education;Manual lymph drainage;Manual techniques;Passive range of motion;Dry needling;Taping    PT Next Visit Plan Need help with getting appt at [place] or need script for compression sleeve? Cont pulleys and add ball roll up wall; also progress HEP to include supine scapular series with  yellow theraband; cont MLD and P/ROM for Lt UE; cont trigger point release to neck and scapular muscles prn    PT  Home Exercise Plan post op breast exercises; supine over towel roll for gentle UE ROM and Meeks Decompression exs; wear compression sleeve and gauntlet daily    Consulted and Agree with Plan of Care Patient           Patient will benefit from skilled therapeutic intervention in order to improve the following deficits and impairments:  Postural dysfunction, Decreased knowledge of precautions  Visit Diagnosis: Stiffness of left shoulder, not elsewhere classified  Abnormal posture  Stiffness of right shoulder, not elsewhere classified  Aftercare following surgery for neoplasm  Lobular carcinoma in situ (LCIS) of left breast  History of bilateral mastectomy     Problem List Patient Active Problem List   Diagnosis Date Noted  . S/P bilateral mastectomy 07/29/2019  . Genetic testing 07/01/2019  . 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 Undifferentiated connective tissue disease ([place]) 03/16/2019  . Sicca syndrome ([place]) 02/23/2019  . Vitamin D deficiency 07/17/2018  . Pancytopenia ([place]) 07/02/2017  . Fibroid uterus 06/21/2017  . Chronic urticaria 05/06/2014  . Autoimmune disorder ([place]) 04/08/2012  . Hypothyroidism 04/08/2012  . Rhinitis 04/08/2012  . ANA positive 04/08/2012  . Infertility, female 11/13/2010  . Thrombocytopenia ([place]) 11/13/2010    Otelia Limes, PTA 08/25/2019, 1:04 PM  [place], [place], [address] Phone: [phone]   Fax:  [phone]  Name: Skyra Crichlow MRN: [id number] Date of Birth: 1970-06-03

## 2019-08-27 ENCOUNTER — Ambulatory Visit: Payer: 59

## 2019-08-27 ENCOUNTER — Other Ambulatory Visit: Payer: Self-pay

## 2019-08-27 DIAGNOSIS — Z9013 Acquired absence of bilateral breasts and nipples: Secondary | ICD-10-CM

## 2019-08-27 DIAGNOSIS — R293 Abnormal posture: Secondary | ICD-10-CM

## 2019-08-27 DIAGNOSIS — M25612 Stiffness of left shoulder, not elsewhere classified: Secondary | ICD-10-CM

## 2019-08-27 DIAGNOSIS — M25611 Stiffness of right shoulder, not elsewhere classified: Secondary | ICD-10-CM

## 2019-08-27 DIAGNOSIS — D0502 Lobular carcinoma in situ of left breast: Secondary | ICD-10-CM | POA: Diagnosis not present

## 2019-08-27 DIAGNOSIS — Z483 Aftercare following surgery for neoplasm: Secondary | ICD-10-CM | POA: Diagnosis not present

## 2019-08-27 NOTE — Therapy (Signed)
[place], [place], [address] Phone: [phone]   Fax:  [phone]  Physical Therapy Treatment  Patient Details  Name: Sarah [name] MRN: [id number] Date of Birth: [birth year] Referring Provider (PT): Ave Filter Date: 08/27/2019   PT End of Session - 08/27/19 0904    Visit Number 11    Number of Visits 15    Date for PT Re-Evaluation 09/11/19    PT Start Time 0803    PT Stop Time 0904    PT Time Calculation (min) 61 min    Activity Tolerance Patient tolerated treatment well    Behavior During Therapy [facility] for tasks assessed/performed           Past Medical History:  Diagnosis Date  . ANA positive    ?Sjogrens  . Breast neoplasm, Tis (LCIS), left   . Cholecystitis   . Cholelithiasis   . Dry eye   . Fibroid   . Gall stones   . GERD (gastroesophageal reflux disease)   . Hypothyroidism    Takes synthroid  . Infertility, female   . Keratitis 2020  . Lactose intolerance   . Leukopenia   . Sjogren's disease ([place])   . Thrombocytopenia ([place])   . Urticaria    episodes since childhood   . Vitamin D deficiency disease    Takes Vit D  . Weight loss     Past Surgical History:  Procedure Laterality Date  . CHOLECYSTECTOMY N/A 07/13/2014   Procedure: LAPAROSCOPIC CHOLECYSTECTOMY WITH INTRAOPERATIVE CHOLANGIOGRAM;  Surgeon: Rolm Bookbinder, MD;  Location: [place];  Service: General;  Laterality: N/A;  . egg retrieval  2008; 2011; 2012   multiple IVF cycles  . ENDOMETRIAL BIOPSY    . SIMPLE MASTECTOMY WITH AXILLARY SENTINEL NODE BIOPSY Bilateral 07/15/2019   Procedure: RIGHT RISK REDUCING MASTECTOMY AND LEFT MASTECTOMY  WITH LEFT AXILLARY SENTINEL NODE BIOPSY;  Surgeon: Rolm Bookbinder, MD;  Location: [place];  Service: General;  Laterality: Bilateral;  PEC BLOCK  . WISDOM TOOTH EXTRACTION      There were no vitals filed for this visit.   Subjective Assessment - 08/27/19 0808     Subjective I'm continuing to get better. The cording at my abdomen seems to be gone, I can't see or feel it anymore. I have some tenderness today at my bil upper traps, Lt medial scapular border and Rt lateral trunk I think just from fullness from going back to work. My Lt forearm is still my most bothersome area with overuse, but much better than it was. I permanently reduced my work hours to 8 hrs for the next 6 weeks.    Pertinent History L breast neoplasm LCIS, 07/15/19 will undergo bilateral mastectomy with L SLND with 3 negative lymph nodes removed on the left , sjogrens disease/lupus.  Pt had biateral mastectomy with ALnd on 07/16/2019    Patient Stated Goals reduce lymphedema risk and learn post op shoulder ex    Currently in Pain? No/denies   2-3/10 deep, ache in Lt forearm with overuse                            [facility] Adult PT Treatment/Exercise - 08/27/19 0001      Shoulder Exercises: Supine   Other Supine Exercises Supine scapular series with yellow theraband returning x5-7 each, tactile and VCs throughout for correct technique.      Manual Therapy   Soft tissue  mobilization With coconut oil to bil upper trap and Lt medial scapular border to area of tightness; then prone with pillow under chest for trigger point release to bil medial scapula and STM to bil upper traps    Myofascial Release across cord at axilla, antecubital fossa and forearm, no release felt today and though still present they are not hindering her motion as they were initially and are less palpable    Manual Lymphatic Drainage (MLD) In Supine: Short neck, superficial and deep abdominals, Lt inguinal nodes and Lt axillo-inguinal anastomosis, (did not use anterior inter-axillary due to cording present on RUQ), then Lt UE from lateral upper arm to dorsal hand then retracing all steps.    Passive ROM In Supine to Lt shoulder into flexion, abduction and D2. Able to keep elbow extended today due to less cording  now, much improved P/ROM                  PT Education - 08/27/19 0903    Education Details Supine scapular series with yellow theraband    Person(s) Educated Patient    Methods Explanation;Demonstration;Handout    Comprehension Verbalized understanding;Returned demonstration;Need further instruction;Verbal cues required;Tactile cues required               PT Long Term Goals - 07/31/19 1210      PT LONG TERM GOAL #1   Title Pt will demonstrate she has regained full shoulder ROM and function post operatively compared to baselines.      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 Also continued with manual therapy to help promote decrease myofascial tightness and improve end Lt > Rt shoulder ROMs.    Personal Factors and Comorbidities Comorbidity 1    Comorbidities sjogrens/lupus mixed disease    Stability/Clinical Decision Making Stable/Uncomplicated    Rehab Potential Excellent    PT Frequency 2x / week    PT Duration 6 weeks    PT Treatment/Interventions ADLs/Self Care Home Management;Patient/family education;Therapeutic exercise;Neuromuscular re-education;Manual lymph drainage;Manual techniques;Passive range of motion;Dry needling;Taping    PT Next Visit Plan Need help with getting appt at [place] or need script for compression sleeve? Cont pulleys and add ball roll up wall; review supine scapular series with yellow theraband;  cont MLD and P/ROM for Lt UE; cont trigger point release to neck and scapular muscles prn    PT Home Exercise Plan post op breast exercises; supine over towel roll for gentle UE ROM and Meeks Decompression exs; wear compression sleeve and gauntlet daily; supine scapular series    Consulted and Agree with Plan of Care Patient  Patient will benefit from skilled therapeutic intervention in order to improve the following deficits and impairments:  Postural dysfunction, Decreased knowledge of precautions  Visit Diagnosis: Stiffness of left shoulder, not elsewhere classified  Abnormal posture  Stiffness of right shoulder, not elsewhere classified  Aftercare following surgery for neoplasm  Lobular carcinoma in situ (LCIS) of left breast  History of bilateral mastectomy     Problem List Patient Active Problem List   Diagnosis Date Noted  . S/P bilateral mastectomy 07/29/2019  . Genetic testing 07/01/2019  . 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 Undifferentiated connective tissue disease ([place]) 03/16/2019  . Sicca syndrome ([place]) 02/23/2019  . Vitamin D deficiency 07/17/2018  . Pancytopenia ([place]) 07/02/2017  . Fibroid uterus 06/21/2017  . Chronic urticaria 05/06/2014  . Autoimmune disorder ([place]) 04/08/2012  . Hypothyroidism 04/08/2012  . Rhinitis 04/08/2012  . ANA positive 04/08/2012  . Infertility, female 11/13/2010  . Thrombocytopenia ([place]) 11/13/2010    Otelia Limes, PTA 08/27/2019, 9:19 AM  [facility], [place], [address] Phone: [phone]   Fax:  [phone]  Name: Sarah [name] MRN: [id number] Date of Birth: 05-24-1970

## 2019-08-27 NOTE — Patient Instructions (Signed)

## 2019-08-31 MED FILL — HYDROXYCHLOROQUINE SULFATE: 200 | 90 days supply | Qty: 135 | Fill #4

## 2019-08-31 MED FILL — predniSONE 1 MG TABS: 1 | 30 days supply | Qty: 60 | Fill #2

## 2019-08-31 MED FILL — CEQUA 0.09 % SOLN: 0.09 | 90 days supply | Qty: 180 | Fill #3

## 2019-09-01 ENCOUNTER — Ambulatory Visit: Payer: 59 | Attending: General Surgery

## 2019-09-01 ENCOUNTER — Telehealth: Payer: Self-pay

## 2019-09-01 ENCOUNTER — Other Ambulatory Visit: Payer: Self-pay

## 2019-09-01 DIAGNOSIS — D0502 Lobular carcinoma in situ of left breast: Secondary | ICD-10-CM | POA: Insufficient documentation

## 2019-09-01 DIAGNOSIS — M25612 Stiffness of left shoulder, not elsewhere classified: Secondary | ICD-10-CM | POA: Diagnosis not present

## 2019-09-01 DIAGNOSIS — R293 Abnormal posture: Secondary | ICD-10-CM | POA: Insufficient documentation

## 2019-09-01 DIAGNOSIS — M25611 Stiffness of right shoulder, not elsewhere classified: Secondary | ICD-10-CM | POA: Insufficient documentation

## 2019-09-01 DIAGNOSIS — A048 Other specified bacterial intestinal infections: Secondary | ICD-10-CM

## 2019-09-01 DIAGNOSIS — Z9013 Acquired absence of bilateral breasts and nipples: Secondary | ICD-10-CM | POA: Insufficient documentation

## 2019-09-01 DIAGNOSIS — Z483 Aftercare following surgery for neoplasm: Secondary | ICD-10-CM | POA: Diagnosis not present

## 2019-09-01 NOTE — Telephone Encounter (Signed)
Spoke with patient, patient states that she hasn't done her H. Pylori stool antigen test yet. Placed order in epic for patient to return to lab.

## 2019-09-01 NOTE — Therapy (Addendum)
[place], [place], [address] Phone: [phone]   Fax:  [phone]  Physical Therapy Treatment  Patient Details  Name: Sarah [name] MRN: [id number] Date of Birth: [birth year] Referring Provider (PT): Ave Filter Date: 09/01/2019   PT End of Session - 09/01/19 0936    Visit Number 12    Number of Visits 15    Date for PT Re-Evaluation 09/11/19    PT Start Time 0805    PT Stop Time 0903    PT Time Calculation (min) 58 min    Activity Tolerance Patient tolerated treatment well    Behavior During Therapy [facility] for tasks assessed/performed           Past Medical History:  Diagnosis Date  . ANA positive    ?Sjogrens  . Breast neoplasm, Tis (LCIS), left   . Cholecystitis   . Cholelithiasis   . Dry eye   . Fibroid   . Gall stones   . GERD (gastroesophageal reflux disease)   . Hypothyroidism    Takes synthroid  . Infertility, female   . Keratitis 2020  . Lactose intolerance   . Leukopenia   . Sjogren's disease ([place])   . Thrombocytopenia ([place])   . Urticaria    episodes since childhood   . Vitamin D deficiency disease    Takes Vit D  . Weight loss     Past Surgical History:  Procedure Laterality Date  . CHOLECYSTECTOMY N/A 07/13/2014   Procedure: LAPAROSCOPIC CHOLECYSTECTOMY WITH INTRAOPERATIVE CHOLANGIOGRAM;  Surgeon: Rolm Bookbinder, MD;  Location: [place];  Service: General;  Laterality: N/A;  . egg retrieval  2008; 2011; 2012   multiple IVF cycles  . ENDOMETRIAL BIOPSY    . SIMPLE MASTECTOMY WITH AXILLARY SENTINEL NODE BIOPSY Bilateral 07/15/2019   Procedure: RIGHT RISK REDUCING MASTECTOMY AND LEFT MASTECTOMY  WITH LEFT AXILLARY SENTINEL NODE BIOPSY;  Surgeon: Rolm Bookbinder, MD;  Location: [place];  Service: General;  Laterality: Bilateral;  PEC BLOCK  . WISDOM TOOTH EXTRACTION      There were no vitals filed for this visit.   Subjective Assessment - 09/01/19 0808     Subjective I'm continuing to show good improvement. I still have some tightness at my bil anterior abdomen areas but cording no longer present, same near my axillae. The forearm tightness and pain is still present and I can see the dimpling from the cording, that is my most problematic area still but it has overal been improving. Also I cant tell the swelling my Lt forearm is calming down but I do still want to try to get a better fitting sleeve, the one you gave me is good when I'm not working, but I need it to fit better at the top of my arm when I do work.    Pertinent History L breast neoplasm LCIS, 07/15/19 underwent bilateral mastectomy with L SLNB with 3 negative lymph nodes removed on the left , sjogrens disease/lupus    Patient Stated Goals reduce lymphedema risk and learn post op shoulder ex    Currently in Pain? No/denies                             [facility] Adult PT Treatment/Exercise - 09/01/19 0001      Manual Therapy   Soft tissue mobilization With coconut oil to Lt UE at medial upper arm for trigger point release, then at distal anterior  forearm where trigger point also palpable, softening of both noted and pt with less tenderness at end of session    Myofascial Release across cord at Lt axilla, antecubital fossa and forearm, no release felt today and though still present they are not hindering her motion as they were initially and are less palpable    Manual Lymphatic Drainage (MLD) In Supine: Short neck, superficial and deep abdominals, Lt inguinal nodes and Lt axillo-inguinal anastomosis, (did not use anterior inter-axillary due to cording present on RUQ), then Lt UE from lateral upper arm to dorsal hand then retracing all steps.    Passive ROM In Supine to Lt shoulder into flexion, abduction and D2 incorporating this with MFR; then same briefly to Lt with Rt lateral trunk MFR but pt with full motion and very mild tissue limitations.                        PT Long Term Goals - 07/31/19 1210      PT LONG TERM GOAL #1   Title Pt will demonstrate she has regained full shoulder ROM and function post operatively compared to baselines.      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 Brief P/ROM of Rt shoulder and MFR to Rt lateral trunk but pt with mild tightness so focused on Lt upper quadrant. Good release of trigger points, though not completely, noted today by pt and therapist. She reports good loosening up of areas that had initially felt tight at beginning of session today. Pt with full bil shoulder P/ROM by end of session. Just still with some mild cording visible in Lt upper arm, though not limiting as it was before. Sent script to Dr. Donne Hazel for prophylactic sleeve and gauntlet, may need custom due to small size of arm.    Personal Factors and Comorbidities Comorbidity 1    Comorbidities sjogrens/lupus mixed disease    Stability/Clinical Decision Making Stable/Uncomplicated    Rehab Potential Excellent    PT Frequency 2x / week    PT Duration 6 weeks    PT Treatment/Interventions ADLs/Self Care Home Management;Patient/family education;Therapeutic exercise;Neuromuscular re-education;Manual lymph drainage;Manual techniques;Passive range of motion;Dry needling;Taping    PT Next Visit Plan Issue compression order script if returned to pt. Did she get measuerd for compression sleeve/gauntlet yet? Cont pulleys and add  ball roll up wall; review supine scapular series with yellow theraband; cont MLD and P/ROM for Lt UE; cont trigger point release to neck and scapular muscles prn Pt may be ready for D/C in next few sessions as she is making excellent progress.    PT Home Exercise Plan post  op breast exercises; supine over towel roll for gentle UE ROM and Meeks Decompression exs; wear compression sleeve and gauntlet daily; supine scapular series    Consulted and Agree with Plan of Care Patient           Patient will benefit from skilled therapeutic intervention in order to improve the following deficits and impairments:  Postural dysfunction, Decreased knowledge of precautions  Visit Diagnosis: Stiffness of left shoulder, not elsewhere classified  Abnormal posture  Stiffness of right shoulder, not elsewhere classified  Aftercare following surgery for neoplasm  Lobular carcinoma in situ (LCIS) of left breast  History of bilateral mastectomy     Problem List Patient Active Problem List   Diagnosis Date Noted  . S/P bilateral mastectomy 07/29/2019  . Genetic testing 07/01/2019  . 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 Undifferentiated connective tissue disease ([place]) 03/16/2019  . Sicca syndrome ([place]) 02/23/2019  . Vitamin D deficiency 07/17/2018  . Pancytopenia ([place]) 07/02/2017  . Fibroid uterus 06/21/2017  . Chronic urticaria 05/06/2014  . Autoimmune disorder ([place]) 04/08/2012  . Hypothyroidism 04/08/2012  . Rhinitis 04/08/2012  . ANA positive 04/08/2012  . Infertility, female 11/13/2010  . Thrombocytopenia ([place]) 11/13/2010    Otelia Limes, PTA 09/01/2019, 9:58 AM  [place], [place], [address] Phone: [phone]   Fax:  [phone]  Name: Sarah [name] MRN: [id number] Date of Birth: 01-01-1971

## 2019-09-07 DIAGNOSIS — H04123 Dry eye syndrome of bilateral lacrimal glands: Secondary | ICD-10-CM | POA: Diagnosis not present

## 2019-09-07 DIAGNOSIS — H04122 Dry eye syndrome of left lacrimal gland: Secondary | ICD-10-CM | POA: Diagnosis not present

## 2019-09-07 DIAGNOSIS — H16223 Keratoconjunctivitis sicca, not specified as Sjogren's, bilateral: Secondary | ICD-10-CM | POA: Diagnosis not present

## 2019-09-08 ENCOUNTER — Other Ambulatory Visit: Payer: Self-pay

## 2019-09-08 ENCOUNTER — Ambulatory Visit: Payer: 59 | Admitting: Physical Therapy

## 2019-09-08 ENCOUNTER — Encounter: Payer: Self-pay | Admitting: Physical Therapy

## 2019-09-08 DIAGNOSIS — M25611 Stiffness of right shoulder, not elsewhere classified: Secondary | ICD-10-CM

## 2019-09-08 DIAGNOSIS — Z483 Aftercare following surgery for neoplasm: Secondary | ICD-10-CM | POA: Diagnosis not present

## 2019-09-08 DIAGNOSIS — Z9013 Acquired absence of bilateral breasts and nipples: Secondary | ICD-10-CM | POA: Diagnosis not present

## 2019-09-08 DIAGNOSIS — M25612 Stiffness of left shoulder, not elsewhere classified: Secondary | ICD-10-CM | POA: Diagnosis not present

## 2019-09-08 DIAGNOSIS — R293 Abnormal posture: Secondary | ICD-10-CM | POA: Diagnosis not present

## 2019-09-08 DIAGNOSIS — D0502 Lobular carcinoma in situ of left breast: Secondary | ICD-10-CM | POA: Diagnosis not present

## 2019-09-08 NOTE — Therapy (Signed)
[place], [place], [address] Phone: [phone]   Fax:  [phone]  Physical Therapy Treatment  Patient Details  Name: Rayana Geurin MRN: [id number] Date of Birth: 02-Feb-1970 Referring Provider (PT): Donne Hazel   Encounter Date: 09/08/2019   PT End of Session - 09/08/19 0900    Visit Number 13    Number of Visits 15    Date for PT Re-Evaluation 09/11/19    PT Start Time 0805    PT Stop Time 1950    PT Time Calculation (min) 50 min    Activity Tolerance Patient tolerated treatment well    Behavior During Therapy [facility] for tasks assessed/performed           Past Medical History:  Diagnosis Date  . ANA positive    ?Sjogrens  . Breast neoplasm, Tis (LCIS), left   . Cholecystitis   . Cholelithiasis   . Dry eye   . Fibroid   . Gall stones   . GERD (gastroesophageal reflux disease)   . Hypothyroidism    Takes synthroid  . Infertility, female   . Keratitis 2020  . Lactose intolerance   . Leukopenia   . Sjogren's disease ([place])   . Thrombocytopenia ([place])   . Urticaria    episodes since childhood   . Vitamin D deficiency disease    Takes Vit D  . Weight loss     Past Surgical History:  Procedure Laterality Date  . CHOLECYSTECTOMY N/A 07/13/2014   Procedure: LAPAROSCOPIC CHOLECYSTECTOMY WITH INTRAOPERATIVE CHOLANGIOGRAM;  Surgeon: Rolm Bookbinder, MD;  Location: [place];  Service: General;  Laterality: N/A;  . egg retrieval  2008; 2011; 2012   multiple IVF cycles  . ENDOMETRIAL BIOPSY    . SIMPLE MASTECTOMY WITH AXILLARY SENTINEL NODE BIOPSY Bilateral 07/15/2019   Procedure: RIGHT RISK REDUCING MASTECTOMY AND LEFT MASTECTOMY  WITH LEFT AXILLARY SENTINEL NODE BIOPSY;  Surgeon: Rolm Bookbinder, MD;  Location: [place];  Service: General;  Laterality: Bilateral;  PEC BLOCK  . WISDOM TOOTH EXTRACTION      There were no vitals filed for this visit.   Subjective Assessment - 09/08/19 0807     Subjective The cording is getting much better. The ROM is 90% improved. I still have some pain in my forearm. The cording on the chest wall is more.    Pertinent History L breast neoplasm LCIS, 07/15/19 underwent bilateral mastectomy with L SLNB with 3 negative lymph nodes removed on the left , sjogrens disease/lupus    Patient Stated Goals reduce lymphedema risk and learn post op shoulder ex    Currently in Pain? No/denies    Pain Score 0-No pain                             [facility] Adult PT Treatment/Exercise - 09/08/19 0001      Manual Therapy   Myofascial Release in supine to cording across abdomen and lateral trunk using cross hand technique- 1 cord in abdomen had palpable release and pt felt better after this    Manual Lymphatic Drainage (MLD) In Supine: Short neck, 5 diaphragmatic breaths, Lt inguinal nodes and Lt axillo-inguinal anastomosis, anterior interaxillary anastomosis, then Lt UE from lateral upper arm to dorsal hand then retracing all steps.                       PT Long Term Goals - 07/31/19 1210  PT LONG TERM GOAL #1   Title Pt will demonstrate she has regained full shoulder ROM and function post operatively compared to baselines.      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 Pt felt the cords in her arm were improving but she contiues to have discomfort from cords in abdomen and lateral trunk. More cords  palpable on left side. Used cross hands technique all across abdomen and lateral trunk. There was a palpable release of cord in right abdomen. Finished session with MLD to LUE. Signed script for custom garments has not come back yet.    PT Frequency 2x / week    PT Duration 6 weeks    PT Treatment/Interventions ADLs/Self Care Home Management;Patient/family education;Therapeutic exercise;Neuromuscular re-education;Manual lymph drainage;Manual techniques;Passive range of motion;Dry needling;Taping    PT Next Visit Plan Issue compression order script if returned to pt. Did she get measuerd for compression sleeve/gauntlet yet? Cont pulleys and add ball roll up wall; review supine scapular series with yellow theraband; cont MLD and P/ROM for Lt UE; cont trigger point release to neck and scapular muscles prn Pt may be ready for D/C in next few sessions as she is making excellent progress.    PT Home Exercise Plan post op breast exercises; supine over towel roll for gentle UE ROM and Meeks Decompression exs; wear compression sleeve and gauntlet daily; supine scapular series    Consulted and Agree with Plan of Care Patient           Patient will benefit from skilled therapeutic intervention in order to improve the following deficits and impairments:  Postural dysfunction, Decreased knowledge of precautions, Increased fascial restricitons, Decreased scar mobility  Visit Diagnosis: Aftercare following surgery for neoplasm  Stiffness of left shoulder, not elsewhere classified  Stiffness of right shoulder, not elsewhere classified     Problem List Patient Active Problem List   Diagnosis Date Noted  . S/P bilateral mastectomy 07/29/2019  . Genetic testing 07/01/2019  . 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 Undifferentiated connective tissue disease ([place]) 03/16/2019  . Sicca syndrome ([place]) 02/23/2019  . Vitamin D deficiency 07/17/2018  . Pancytopenia ([place])  07/02/2017  . Fibroid uterus 06/21/2017  . Chronic urticaria 05/06/2014  . Autoimmune disorder ([place]) 04/08/2012  . Hypothyroidism 04/08/2012  . Rhinitis 04/08/2012  . ANA positive 04/08/2012  . Infertility, female 11/13/2010  . Thrombocytopenia ([place]) 11/13/2010    Allyson Sabal [facility] 09/08/2019, 9:04 AM  [place], [place], [address] Phone: [phone]   Fax:  [phone]  Name: Franchelle Foskett MRN: [id number] Date of  Birth: September 07, 1970  Manus Gunning, PT 09/08/19 9:04 AM

## 2019-09-10 ENCOUNTER — Encounter: Payer: Self-pay | Admitting: Physical Therapy

## 2019-09-10 ENCOUNTER — Other Ambulatory Visit: Payer: Self-pay

## 2019-09-10 ENCOUNTER — Ambulatory Visit: Payer: 59 | Admitting: Physical Therapy

## 2019-09-10 DIAGNOSIS — R293 Abnormal posture: Secondary | ICD-10-CM | POA: Diagnosis not present

## 2019-09-10 DIAGNOSIS — M25612 Stiffness of left shoulder, not elsewhere classified: Secondary | ICD-10-CM | POA: Diagnosis not present

## 2019-09-10 DIAGNOSIS — M25611 Stiffness of right shoulder, not elsewhere classified: Secondary | ICD-10-CM | POA: Diagnosis not present

## 2019-09-10 DIAGNOSIS — D0502 Lobular carcinoma in situ of left breast: Secondary | ICD-10-CM | POA: Diagnosis not present

## 2019-09-10 DIAGNOSIS — Z9013 Acquired absence of bilateral breasts and nipples: Secondary | ICD-10-CM

## 2019-09-10 DIAGNOSIS — Z483 Aftercare following surgery for neoplasm: Secondary | ICD-10-CM

## 2019-09-10 NOTE — Therapy (Signed)
[place], [place], [address] Phone: [phone]   Fax:  [phone]  Physical Therapy Treatment  Patient Details  Name: Sarah [name] MRN: [id number] Date of Birth: [birth year] Referring Provider (PT): Donne Hazel   Encounter Date: 09/10/2019   PT End of Session - 09/10/19 1601    Visit Number 14    Number of Visits 23    Date for PT Re-Evaluation 10/08/19    PT Start Time 1501    PT Stop Time 1558    PT Time Calculation (min) 57 min    Activity Tolerance Patient tolerated treatment well    Behavior During Therapy [facility] for tasks assessed/performed           Past Medical History:  Diagnosis Date  . ANA positive    ?Sjogrens  . Breast neoplasm, Tis (LCIS), left   . Cholecystitis   . Cholelithiasis   . Dry eye   . Fibroid   . Gall stones   . GERD (gastroesophageal reflux disease)   . Hypothyroidism    Takes synthroid  . Infertility, female   . Keratitis 2020  . Lactose intolerance   . Leukopenia   . Sjogren's disease ([place])   . Thrombocytopenia ([place])   . Urticaria    episodes since childhood   . Vitamin D deficiency disease    Takes Vit D  . Weight loss     Past Surgical History:  Procedure Laterality Date  . CHOLECYSTECTOMY N/A 07/13/2014   Procedure: LAPAROSCOPIC CHOLECYSTECTOMY WITH INTRAOPERATIVE CHOLANGIOGRAM;  Surgeon: Rolm Bookbinder, MD;  Location: [place];  Service: General;  Laterality: N/A;  . egg retrieval  2008; 2011; 2012   multiple IVF cycles  . ENDOMETRIAL BIOPSY    . SIMPLE MASTECTOMY WITH AXILLARY SENTINEL NODE BIOPSY Bilateral 07/15/2019   Procedure: RIGHT RISK REDUCING MASTECTOMY AND LEFT MASTECTOMY  WITH LEFT AXILLARY SENTINEL NODE BIOPSY;  Surgeon: Rolm Bookbinder, MD;  Location: [place];  Service: General;  Laterality: Bilateral;  PEC BLOCK  . WISDOM TOOTH EXTRACTION      There were no vitals filed for this visit.   Subjective Assessment - 09/10/19 1513     Subjective I can still see some cording in my abdomen. The left side is doing better since you worked on it.    Pertinent History L breast neoplasm LCIS, 07/15/19 underwent bilateral mastectomy with L SLNB with 3 negative lymph nodes removed on the left , sjogrens disease/lupus    Patient Stated Goals reduce lymphedema risk and learn post op shoulder ex    Pain Score 3     Pain Location Abdomen    Pain Orientation Right;Left    Pain Descriptors / Indicators Tender    Pain Type Surgical pain    Pain Onset 1 to 4 weeks ago    Aggravating Factors  stretching and changing position                             [facility] Adult PT Treatment/Exercise - 09/10/19 0001      Manual Therapy   Edema Management Issued script for custom flat knit compression sleeve and glove and info on how to obtain it    Myofascial Release in supine to cording across abdomen and lateral trunk using cross hand technique to R and L sides with extra focus at cord in left lateral trunk and anterior abdomen, also briefly to cording at LUE in upper arm  to antecubital fossa                       PT Long Term Goals - 09/10/19 1605      PT LONG TERM GOAL #1   Title Pt will demonstrate she has regained full shoulder ROM and function post operatively compared to baselines.    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    Time 4    Period Weeks    Status New    Target Date 10/08/19                 Plan - 09/10/19 1602    Clinical Impression Statement Continued to focus on myofascial release to cording at abdomen and lateral trunk. Two thicks cords palpable- 1 at abdomen and 1 at left lateral trunk. No releases palpable but cords less visible and palpable. Issued signed script for custom flat knit compression sleeve and glove. Pt would benefit from continued skilled PT services to continue to decrease tightness and discomfort due to cording.    PT Frequency 2x / week    PT Duration 4 weeks    PT Treatment/Interventions ADLs/Self Care Home Management;Patient/family education;Therapeutic exercise;Neuromuscular re-education;Manual lymph drainage;Manual techniques;Passive range of motion;Dry needling;Taping    PT Next Visit Plan MFR to cording, Did she get measuerd for compression sleeve/gauntlet yet? Cont pulleys and add ball roll up wall; review supine scapular series with yellow theraband; cont MLD and P/ROM for Lt UE; cont trigger point release to neck and scapular muscles prn    PT Home Exercise Plan post op breast exercises; supine over towel roll for gentle UE ROM and Meeks Decompression exs; wear compression sleeve and gauntlet daily; supine scapular series    Recommended Other Services issued signed script for sleeve and gauntlet    Consulted and Agree with Plan of Care Patient           Patient will benefit from skilled therapeutic intervention in order to improve the following deficits and impairments:  Postural dysfunction, Decreased knowledge of precautions, Increased fascial restricitons, Decreased scar mobility  Visit Diagnosis: Aftercare following surgery for neoplasm - Plan: PT plan of care cert/re-cert  Stiffness of left shoulder, not elsewhere classified - Plan: PT plan of care cert/re-cert  Stiffness of right shoulder, not elsewhere classified - Plan: PT plan of  care cert/re-cert  Abnormal posture - Plan: PT plan of care cert/re-cert  Lobular carcinoma in situ (LCIS) of left breast - Plan: PT plan of care cert/re-cert  History of bilateral mastectomy -  Plan: PT plan of care cert/re-cert     Problem List Patient Active Problem List   Diagnosis Date Noted  . S/P bilateral mastectomy 07/29/2019  . Genetic testing 07/01/2019  . 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 Undifferentiated connective tissue disease ([place]) 03/16/2019  . Sicca syndrome ([place]) 02/23/2019  . Vitamin D deficiency 07/17/2018  . Pancytopenia ([place]) 07/02/2017  . Fibroid uterus 06/21/2017  . Chronic urticaria 05/06/2014  . Autoimmune disorder ([place]) 04/08/2012  . Hypothyroidism 04/08/2012  . Rhinitis 04/08/2012  . ANA positive 04/08/2012  . Infertility, female 11/13/2010  . Thrombocytopenia ([place]) 11/13/2010    Allyson Sabal [facility] 09/10/2019, 4:09 PM  [place], [place], [address] Phone: [phone]   Fax:  [phone]  Name: Sarah [name] MRN: [id number] Date of Birth: [birth year]  Manus Gunning, PT 09/10/19 4:09 PM

## 2019-09-11 DIAGNOSIS — H04121 Dry eye syndrome of right lacrimal gland: Secondary | ICD-10-CM | POA: Diagnosis not present

## 2019-09-11 DIAGNOSIS — H26221 Cataract secondary to ocular disorders (degenerative) (inflammatory), right eye: Secondary | ICD-10-CM | POA: Diagnosis not present

## 2019-09-14 DIAGNOSIS — M3219 Other organ or system involvement in systemic lupus erythematosus: Secondary | ICD-10-CM | POA: Diagnosis not present

## 2019-09-14 DIAGNOSIS — D696 Thrombocytopenia, unspecified: Secondary | ICD-10-CM | POA: Diagnosis not present

## 2019-09-14 DIAGNOSIS — L508 Other urticaria: Secondary | ICD-10-CM | POA: Diagnosis not present

## 2019-09-14 DIAGNOSIS — R21 Rash and other nonspecific skin eruption: Secondary | ICD-10-CM | POA: Diagnosis not present

## 2019-09-14 DIAGNOSIS — M359 Systemic involvement of connective tissue, unspecified: Secondary | ICD-10-CM | POA: Diagnosis not present

## 2019-09-15 DIAGNOSIS — H04123 Dry eye syndrome of bilateral lacrimal glands: Secondary | ICD-10-CM | POA: Diagnosis not present

## 2019-09-15 MED FILL — LOTEPREDNOL ETABONATE 0.5 %: 0.5 | 25 days supply | Qty: 5 | Fill #0

## 2019-09-22 ENCOUNTER — Ambulatory Visit: Payer: 59

## 2019-09-22 ENCOUNTER — Other Ambulatory Visit: Payer: Self-pay

## 2019-09-22 DIAGNOSIS — Z483 Aftercare following surgery for neoplasm: Secondary | ICD-10-CM | POA: Diagnosis not present

## 2019-09-22 DIAGNOSIS — M25612 Stiffness of left shoulder, not elsewhere classified: Secondary | ICD-10-CM

## 2019-09-22 DIAGNOSIS — M25611 Stiffness of right shoulder, not elsewhere classified: Secondary | ICD-10-CM

## 2019-09-22 DIAGNOSIS — Z9013 Acquired absence of bilateral breasts and nipples: Secondary | ICD-10-CM

## 2019-09-22 DIAGNOSIS — R293 Abnormal posture: Secondary | ICD-10-CM

## 2019-09-22 DIAGNOSIS — D0502 Lobular carcinoma in situ of left breast: Secondary | ICD-10-CM | POA: Diagnosis not present

## 2019-09-22 NOTE — Therapy (Signed)
[place], [place], [address] Phone: [phone]   Fax:  [phone]  Physical Therapy Treatment  Patient Details  Name: Sarah [name] MRN: [id number] Date of Birth: 1970/03/05 Referring Provider (PT): Ave Filter Date: 09/22/2019   PT End of Session - 09/22/19 1224    Visit Number 15    Number of Visits 23    Date for PT Re-Evaluation 10/08/19    PT Start Time 1111    PT Stop Time 1215    PT Time Calculation (min) 64 min    Activity Tolerance Patient tolerated treatment well    Behavior During Therapy [facility] for tasks assessed/performed           Past Medical History:  Diagnosis Date  . ANA positive    ?Sjogrens  . Breast neoplasm, Tis (LCIS), left   . Cholecystitis   . Cholelithiasis   . Dry eye   . Fibroid   . Gall stones   . GERD (gastroesophageal reflux disease)   . Hypothyroidism    Takes synthroid  . Infertility, female   . Keratitis 2020  . Lactose intolerance   . Leukopenia   . Sjogren's disease ([place])   . Thrombocytopenia ([place])   . Urticaria    episodes since childhood   . Vitamin D deficiency disease    Takes Vit D  . Weight loss     Past Surgical History:  Procedure Laterality Date  . CHOLECYSTECTOMY N/A 07/13/2014   Procedure: LAPAROSCOPIC CHOLECYSTECTOMY WITH INTRAOPERATIVE CHOLANGIOGRAM;  Surgeon: Rolm Bookbinder, MD;  Location: [place];  Service: General;  Laterality: N/A;  . egg retrieval  2008; 2011; 2012   multiple IVF cycles  . ENDOMETRIAL BIOPSY    . SIMPLE MASTECTOMY WITH AXILLARY SENTINEL NODE BIOPSY Bilateral 07/15/2019   Procedure: RIGHT RISK REDUCING MASTECTOMY AND LEFT MASTECTOMY  WITH LEFT AXILLARY SENTINEL NODE BIOPSY;  Surgeon: Rolm Bookbinder, MD;  Location: [place];  Service: General;  Laterality: Bilateral;  PEC BLOCK  . WISDOM TOOTH EXTRACTION      There were no vitals filed for this visit.   Subjective Assessment - 09/22/19 1117     Subjective I have days that are no pain, but last few days it's starting to come back a bit with working. I did just get my compression sleeve and gauntlet right before I came here and it seems to fit good.    Pertinent History L breast neoplasm LCIS, 07/15/19 underwent bilateral mastectomy with L SLNB with 3 negative lymph nodes removed on the left , sjogrens disease/lupus    Patient Stated Goals reduce lymphedema risk and learn post op shoulder ex    Currently in Pain? Yes    Pain Score 1     Pain Location Arm    Pain Orientation Left    Pain Descriptors / Indicators Sore;Tender;Tightness    Pain Type Surgical pain    Pain Onset 1 to 4 weeks ago    Pain Frequency Intermittent    Aggravating Factors  working    Pain Relieving Factors stretching and wearing compression                             [facility] Adult PT Treatment/Exercise - 09/22/19 0001      Shoulder Exercises: Pulleys   Flexion 1 minute    Flexion Limitations Pt with much improved     ABduction 2 minutes  Shoulder Exercises: Therapy Ball   Flexion Both;5 reps   forward lean into end of stretch   Flexion Limitations VCs not to bounce at end stretch; with prolonged holds    ABduction Both;5 reps   same side lean into end of stretch   ABduction Limitations with prolonged holds      Shoulder Exercises: Stretch   Other Shoulder Stretches Modified downward dog on wall 2x, 5 sec holds, also dropping shoulder each way for increased stretche to trunk      Manual Therapy   Soft tissue mobilization With coconut oil to Lt UE at medial upper arm for trigger point release, then at distal anterior forearm where trigger point also palpable; then in Rt S/L for STM to medial scapular border for trigger point release    Myofascial Release To Lt axilla and forearm where cording noted today but mildly, improved by end of session    Manual Lymphatic Drainage (MLD) In Supine: Short neck, superficial abdominals and 5  diaphragmatic breaths, Lt inguinal nodes and Lt axillo-inguinal anastomosis, anterior interaxillary anastomosis, then Lt UE from lateral upper arm to dorsal hand then retracing all steps.    Neural Stretch To Lt UE                       PT Long Term Goals - 09/10/19 1605      PT LONG TERM GOAL #1   Title Pt will demonstrate she has regained full shoulder ROM and function post operatively compared to baselines.    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    Time 4    Period Weeks    Status New    Target Date 10/08/19                 Plan - 09/22/19 1225    Clinical Impression Statement Continued with AA/ROM stretching to bil UE's and added modified downward dog on wall. Pt tolerated these well and reported feeling good stretches. Then continued with manual therapy focusing on Lt upper quadrant working to decongestive lymphatic fluid from UE and decreasing myofascial tightness of trunk and upper quadrant. Also STM to Lt medial scapular border. Overall pt reports feeling less tightness at forearm than she was feeling at start of session. Her new compression sleeve and gauntlet seem to be a good fit except tightness noted at wrist, but she was wearing her sleeve on her ulnar styloid. Educated that this should be distal to styloid. She verbalized understanding. Also educated her  that after a few  days wear if this continues to be too tight she can go back to fitter. She verbalized understanding.    Personal Factors and Comorbidities Comorbidity 1    Comorbidities sjogrens/lupus mixed disease    Stability/Clinical Decision Making Stable/Uncomplicated    Rehab Potential Excellent    PT Frequency 2x / week    PT Duration 4 weeks    PT Treatment/Interventions ADLs/Self Care Home Management;Patient/family education;Therapeutic exercise;Neuromuscular re-education;Manual lymph drainage;Manual techniques;Passive range of motion;Dry needling;Taping    PT Next Visit Plan Consider retaking SOZO as pt has been wearing compression for over 28 days, but just got better fitting sleeve today so may want to wait another 28 days?; How is sleeve fitting at wrist/gauntlet at thumb webspace, need foam here? MFR to cording, Cont pulleys and add ball roll up wall; review supine scapular series with yellow theraband; cont MLD and P/ROM for Lt UE; cont trigger point release to neck and scapular muscles prn    PT Home Exercise Plan post op breast exercises; supine over towel roll for gentle UE ROM and Meeks Decompression exs; wear compression sleeve and gauntlet daily; supine scapular series    Consulted and Agree with Plan of Care Patient           Patient will benefit from skilled therapeutic intervention in order to improve the following deficits and impairments:  Postural dysfunction, Decreased knowledge of precautions, Increased fascial restricitons, Decreased scar mobility  Visit Diagnosis: Aftercare following surgery for neoplasm  Stiffness of left shoulder, not elsewhere classified  Stiffness of right shoulder, not elsewhere classified  Abnormal posture  Lobular carcinoma in situ (LCIS) of left breast  History of bilateral mastectomy     Problem List Patient Active Problem List   Diagnosis Date Noted  . S/P bilateral mastectomy 07/29/2019  . Genetic testing 07/01/2019  .  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 Undifferentiated connective tissue disease ([place]) 03/16/2019  . 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 Vitamin D deficiency 07/17/2018  . Pancytopenia ([place]) 07/02/2017  . Fibroid uterus 06/21/2017  . Chronic urticaria 05/06/2014  . Autoimmune disorder ([place]) 04/08/2012  . Hypothyroidism 04/08/2012  . Rhinitis 04/08/2012  . ANA positive 04/08/2012  . Infertility, female 11/13/2010  . Thrombocytopenia ([place]) 11/13/2010    Otelia Limes, PTA 09/22/2019, 12:39 PM  [place], [place], [address] Phone: [phone]   Fax:  [phone]  Name: Sarah [name] MRN: [id number] Date of Birth: Feb 24, 1970

## 2019-09-23 ENCOUNTER — Ambulatory Visit: Payer: 59 | Admitting: Physical Therapy

## 2019-09-23 ENCOUNTER — Encounter: Payer: Self-pay | Admitting: Physical Therapy

## 2019-09-23 DIAGNOSIS — Z483 Aftercare following surgery for neoplasm: Secondary | ICD-10-CM

## 2019-09-23 DIAGNOSIS — M25612 Stiffness of left shoulder, not elsewhere classified: Secondary | ICD-10-CM

## 2019-09-23 DIAGNOSIS — M25611 Stiffness of right shoulder, not elsewhere classified: Secondary | ICD-10-CM | POA: Diagnosis not present

## 2019-09-23 DIAGNOSIS — D0502 Lobular carcinoma in situ of left breast: Secondary | ICD-10-CM | POA: Diagnosis not present

## 2019-09-23 DIAGNOSIS — Z9013 Acquired absence of bilateral breasts and nipples: Secondary | ICD-10-CM | POA: Diagnosis not present

## 2019-09-23 DIAGNOSIS — R293 Abnormal posture: Secondary | ICD-10-CM | POA: Diagnosis not present

## 2019-09-23 NOTE — Therapy (Signed)
[place], [place], [address] Phone: [phone]   Fax:  [phone]  Physical Therapy Treatment  Patient Details  Name: Sarah [name] MRN: [id number] Date of Birth: [birth year] Referring Provider (PT): Donne Hazel   Encounter Date: 09/23/2019   PT End of Session - 09/23/19 1105    Visit Number 16    Number of Visits 23    Date for PT Re-Evaluation 10/08/19    PT Start Time 1003    PT Stop Time 1055    PT Time Calculation (min) 52 min    Activity Tolerance Patient tolerated treatment well    Behavior During Therapy [facility] for tasks assessed/performed           Past Medical History:  Diagnosis Date  . ANA positive    ?Sjogrens  . Breast neoplasm, Tis (LCIS), left   . Cholecystitis   . Cholelithiasis   . Dry eye   . Fibroid   . Gall stones   . GERD (gastroesophageal reflux disease)   . Hypothyroidism    Takes synthroid  . Infertility, female   . Keratitis 2020  . Lactose intolerance   . Leukopenia   . Sjogren's disease ([place])   . Thrombocytopenia ([place])   . Urticaria    episodes since childhood   . Vitamin D deficiency disease    Takes Vit D  . Weight loss     Past Surgical History:  Procedure Laterality Date  . CHOLECYSTECTOMY N/A 07/13/2014   Procedure: LAPAROSCOPIC CHOLECYSTECTOMY WITH INTRAOPERATIVE CHOLANGIOGRAM;  Surgeon: Rolm Bookbinder, MD;  Location: [place];  Service: General;  Laterality: N/A;  . egg retrieval  2008; 2011; 2012   multiple IVF cycles  . ENDOMETRIAL BIOPSY    . SIMPLE MASTECTOMY WITH AXILLARY SENTINEL NODE BIOPSY Bilateral 07/15/2019   Procedure: RIGHT RISK REDUCING MASTECTOMY AND LEFT MASTECTOMY  WITH LEFT AXILLARY SENTINEL NODE BIOPSY;  Surgeon: Rolm Bookbinder, MD;  Location: [place];  Service: General;  Laterality: Bilateral;  PEC BLOCK  . WISDOM TOOTH EXTRACTION      There were no vitals filed for this visit.   Subjective Assessment - 09/23/19 1006     Subjective Overall my tightness has improved. Things have gotten a little worse since it has been 13 days since I was last here.    Pertinent History L breast neoplasm LCIS, 07/15/19 underwent bilateral mastectomy with L SLNB with 3 negative lymph nodes removed on the left , sjogrens disease/lupus    Patient Stated Goals reduce lymphedema risk and learn post op shoulder ex    Currently in Pain? Yes    Pain Score 2     Pain Location Arm    Pain Orientation Left    Pain Descriptors / Indicators Tender    Pain Type Surgical pain    Pain Onset In the past 7 days    Pain Frequency Intermittent                             [facility] Adult PT Treatment/Exercise - 09/23/19 0001      Manual Therapy   Myofascial Release To Lt axilla posterior upper arm and forearm where pt feels increased tightness and discomfort, also along left anterior chest    Manual Lymphatic Drainage (MLD) In Supine: Short neck, superficial abdominals and 5 diaphragmatic breaths, Lt inguinal nodes and Lt axillo-inguinal anastomosis, anterior interaxillary anastomosis, then Lt UE from lateral upper arm to dorsal  hand then retracing all steps.                       PT Long Term Goals - 09/10/19 1605      PT LONG TERM GOAL #1   Title Pt will demonstrate she has regained full shoulder ROM and function post operatively compared to baselines.    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    Time 4    Period Weeks    Status New    Target Date 10/08/19                 Plan - 09/23/19 1106    Clinical Impression Statement Continued with manual therapy to left forearm, upper arm and across left chest to help decrease tightness. No cording palpable in this area but pt did have tightness that she reports improves with myofascial release. Ended session with MLD to LUE. Pt does have visible cording at back of thumb so spent extra time here with MLD. She received her sleeve and reports it fits well but she has not worn it long enough to know if she will get a rash.    Comorbidities sjogrens/lupus mixed disease    PT Frequency 2x / week    PT Duration 4 weeks    PT Treatment/Interventions ADLs/Self Care Home Management;Patient/family education;Therapeutic exercise;Neuromuscular re-education;Manual lymph drainage;Manual techniques;Passive range of motion;Dry needling;Taping    PT Next Visit Plan Consider retaking SOZO as pt has been wearing compression for over 28 days, but just got better fitting sleeve today so may want to wait another 28 days?; How is sleeve fitting at wrist/gauntlet at thumb webspace, need foam here? MFR to cording, Cont pulleys and add ball roll up wall; review supine scapular series with yellow theraband; cont MLD and P/ROM for Lt UE; cont trigger point release to neck and scapular muscles prn    PT Home Exercise Plan post op breast exercises; supine over towel roll for gentle UE ROM and Meeks Decompression exs; wear compression sleeve and gauntlet daily; supine scapular series    Consulted and Agree with Plan of Care Patient           Patient will benefit from skilled therapeutic intervention in order to improve the following deficits and impairments:  Postural dysfunction, Decreased knowledge of precautions, Increased fascial restricitons, Decreased scar mobility  Visit  Diagnosis: Aftercare following surgery for neoplasm  Stiffness of left shoulder, not elsewhere classified     Problem List Patient Active Problem List   Diagnosis Date  Noted  . S/P bilateral mastectomy 07/29/2019  . Genetic testing 07/01/2019  . 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 Undifferentiated connective tissue disease ([place]) 03/16/2019  . Sicca syndrome ([place]) 02/23/2019  . Vitamin D deficiency 07/17/2018  . Pancytopenia ([place]) 07/02/2017  . Fibroid uterus 06/21/2017  . Chronic urticaria 05/06/2014  . Autoimmune disorder ([place]) 04/08/2012  . Hypothyroidism 04/08/2012  . Rhinitis 04/08/2012  . ANA positive 04/08/2012  . Infertility, female 11/13/2010  . Thrombocytopenia ([place]) 11/13/2010    Allyson Sabal [facility] 09/23/2019, 11:08 AM  [place], [place], [address] Phone: [phone]   Fax:  [phone]  Name: Sarah [name] MRN: [id number] Date of Birth: [birth year]  Manus Gunning, PT 09/23/19 11:08 AM

## 2019-09-29 ENCOUNTER — Encounter: Payer: Self-pay | Admitting: Physical Therapy

## 2019-09-29 ENCOUNTER — Ambulatory Visit: Payer: 59 | Admitting: Physical Therapy

## 2019-09-29 ENCOUNTER — Other Ambulatory Visit: Payer: Self-pay

## 2019-09-29 DIAGNOSIS — Z9013 Acquired absence of bilateral breasts and nipples: Secondary | ICD-10-CM | POA: Diagnosis not present

## 2019-09-29 DIAGNOSIS — M25611 Stiffness of right shoulder, not elsewhere classified: Secondary | ICD-10-CM | POA: Diagnosis not present

## 2019-09-29 DIAGNOSIS — M25612 Stiffness of left shoulder, not elsewhere classified: Secondary | ICD-10-CM | POA: Diagnosis not present

## 2019-09-29 DIAGNOSIS — Z483 Aftercare following surgery for neoplasm: Secondary | ICD-10-CM

## 2019-09-29 DIAGNOSIS — D0502 Lobular carcinoma in situ of left breast: Secondary | ICD-10-CM | POA: Diagnosis not present

## 2019-09-29 DIAGNOSIS — R293 Abnormal posture: Secondary | ICD-10-CM | POA: Diagnosis not present

## 2019-09-29 NOTE — Therapy (Signed)
Sarah [name], [place], [address] Phone: [phone]   Fax:  [phone]  Physical Therapy Treatment  Patient Details  Name: Sarah [name] MRN: [id number] Date of Birth: May 08, 1970 Referring Provider (PT): Ave Filter Date: 09/29/2019   PT End of Session - 09/29/19 0906    Visit Number 17    Number of Visits 23    Date for PT Re-Evaluation 10/08/19    PT Start Time 0806    PT Stop Time 0855    PT Time Calculation (min) 49 min    Activity Tolerance Patient tolerated treatment well    Behavior During Therapy [facility] for tasks assessed/performed           Past Medical History:  Diagnosis Date   ANA positive    ?Sjogrens   Breast neoplasm, Tis (LCIS), left    Cholecystitis    Cholelithiasis    Dry eye    Fibroid    Gall stones    GERD (gastroesophageal reflux disease)    Hypothyroidism    Takes synthroid   Infertility, female    Keratitis 2020   Lactose intolerance    Leukopenia    Sjogren's disease ([place])    Thrombocytopenia ([place])    Urticaria    episodes since childhood    Vitamin D deficiency disease    Takes Vit D   Weight loss     Past Surgical History:  Procedure Laterality Date   CHOLECYSTECTOMY N/A 07/13/2014   Procedure: LAPAROSCOPIC CHOLECYSTECTOMY WITH INTRAOPERATIVE CHOLANGIOGRAM;  Surgeon: Rolm Bookbinder, MD;  Location: [place];  Service: General;  Laterality: N/A;   egg retrieval  2008; 2011; 2012   multiple IVF cycles   ENDOMETRIAL BIOPSY     SIMPLE MASTECTOMY WITH AXILLARY SENTINEL NODE BIOPSY Bilateral 07/15/2019   Procedure: RIGHT RISK REDUCING MASTECTOMY AND LEFT MASTECTOMY  WITH LEFT AXILLARY SENTINEL NODE BIOPSY;  Surgeon: Rolm Bookbinder, MD;  Location: [place];  Service: General;  Laterality: Bilateral;  PEC BLOCK   WISDOM TOOTH EXTRACTION      There were no vitals filed for this visit.   Subjective Assessment - 09/29/19 0808     Subjective I am getting better but I still have some swelling at my wrist. I still have some tightness when I raise my left arm.    Pertinent History L breast neoplasm LCIS, 07/15/19 underwent bilateral mastectomy with L SLNB with 3 negative lymph nodes removed on the left , sjogrens disease/lupus    Patient Stated Goals reduce lymphedema risk and learn post op shoulder ex    Currently in Pain? Yes    Pain Score 2     Pain Location Arm    Pain Orientation Left    Pain Descriptors / Indicators Tender                  L-DEX FLOWSHEETS - 09/29/19 0800      L-DEX LYMPHEDEMA SCREENING   BASELINE SCORE (UNILATERAL) -7.6    L-DEX SCORE (UNILATERAL) -3.8    VALUE CHANGE (UNILAT) 3.8                     [facility] Adult PT Treatment/Exercise - 09/29/19 0001      Manual Therapy   Edema Management remeasured SOZO, cut 1/2 grey foam for pt to wear under her sleeve and glove to avoid it digging in    Myofascial Release Along entire LUE from thumb to axilla using cross hand  technique to stretch thick cord and 2 small cords at thumb, also along left side of abdomen to stretch area of tightness which improved with MFR and to L axilla where pt has increased tenderness possibly due to deep cording                       PT Long Term Goals - 09/10/19 1605      PT LONG TERM GOAL #1   Title Pt will demonstrate she has regained full shoulder ROM and function post operatively compared to baselines.    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    Time 4    Period Weeks    Status New    Target Date 10/08/19                 Plan - 09/29/19 0908    Clinical Impression Statement Continued with myofascial release to all areas of cording throughout LUE and L trunk. Pt still has a thick cord present in her LUE and palpable tightness in L trunk. Cording still visible at thumb. Remeasured with SOZO and her score has improved since last session (moving in negative direction) though she still has visible swelling at left wrist. Cut piece of 1/2 grey foam to pt to wear under her sleeve and glove at wrist to avoid it digging in.    PT Frequency 2x / week    PT Duration 4 weeks    PT Treatment/Interventions ADLs/Self Care Home Management;Patient/family education;Therapeutic exercise;Neuromuscular re-education;Manual lymph drainage;Manual techniques;Passive range of motion;Dry needling;Taping    PT Next Visit Plan MFR to cording, Cont pulleys and add ball roll up wall; review supine scapular series with yellow theraband; cont MLD and P/ROM for Lt UE; cont trigger point release to neck and scapular muscles prn    PT Home Exercise Plan post op breast exercises; supine over towel roll for gentle UE ROM and Meeks Decompression exs; wear compression sleeve and gauntlet daily; supine scapular series    Consulted and Agree with Plan of Care Patient           Patient will benefit from skilled therapeutic intervention in order to improve the following deficits and impairments:  Postural dysfunction, Decreased knowledge of precautions, Increased fascial restricitons, Decreased scar mobility  Visit Diagnosis: Aftercare following surgery for neoplasm  Stiffness of left shoulder, not elsewhere classified     Problem List Patient Active Problem List   Diagnosis Date Noted   S/P  bilateral mastectomy 07/29/2019   Genetic testing 07/01/2019  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 06/15/2019   Undifferentiated connective tissue disease ([place]) 03/16/2019   Sicca syndrome ([place]) 02/23/2019   Vitamin D deficiency 07/17/2018   Pancytopenia ([place]) 07/02/2017   Fibroid uterus 06/21/2017   Chronic urticaria 05/06/2014   Autoimmune disorder ([place]) 04/08/2012   Hypothyroidism 04/08/2012   Rhinitis 04/08/2012   ANA positive 04/08/2012   Infertility, female 11/13/2010   Thrombocytopenia ([place]) 11/13/2010    Allyson Sabal [facility] 09/29/2019, 12:18 PM  [place], [place], [address] Phone: [phone]   Fax:  [phone]  Name: Gavina Dildine MRN: [id number] Date of Birth: 1970/06/27  Manus Gunning, PT 09/29/19 12:18 PM

## 2019-10-06 DIAGNOSIS — C50912 Malignant neoplasm of unspecified site of left female breast: Secondary | ICD-10-CM | POA: Diagnosis not present

## 2019-10-06 DIAGNOSIS — Z9011 Acquired absence of right breast and nipple: Secondary | ICD-10-CM | POA: Diagnosis not present

## 2019-10-07 ENCOUNTER — Other Ambulatory Visit: Payer: Self-pay

## 2019-10-07 ENCOUNTER — Ambulatory Visit: Payer: 59 | Attending: General Surgery

## 2019-10-07 DIAGNOSIS — R293 Abnormal posture: Secondary | ICD-10-CM | POA: Insufficient documentation

## 2019-10-07 DIAGNOSIS — M25611 Stiffness of right shoulder, not elsewhere classified: Secondary | ICD-10-CM | POA: Insufficient documentation

## 2019-10-07 DIAGNOSIS — D0502 Lobular carcinoma in situ of left breast: Secondary | ICD-10-CM | POA: Diagnosis not present

## 2019-10-07 DIAGNOSIS — Z483 Aftercare following surgery for neoplasm: Secondary | ICD-10-CM | POA: Insufficient documentation

## 2019-10-07 DIAGNOSIS — M542 Cervicalgia: Secondary | ICD-10-CM | POA: Diagnosis not present

## 2019-10-07 DIAGNOSIS — M25612 Stiffness of left shoulder, not elsewhere classified: Secondary | ICD-10-CM | POA: Diagnosis not present

## 2019-10-07 DIAGNOSIS — Z9013 Acquired absence of bilateral breasts and nipples: Secondary | ICD-10-CM | POA: Diagnosis not present

## 2019-10-07 NOTE — Therapy (Signed)
[place], [place], [address] Phone: [phone]   Fax:  [phone]  Physical Therapy Treatment  Patient Details  Name: Sarah [name] MRN: [id number] Date of Birth: 28-Oct-1970 Referring Provider (PT): Ave Filter Date: 10/07/2019   PT End of Session - 10/07/19 1007    Visit Number 18    Number of Visits 23    Date for PT Re-Evaluation 10/08/19    PT Start Time 0906    PT Stop Time 1005    PT Time Calculation (min) 59 min    Activity Tolerance Patient tolerated treatment well    Behavior During Therapy [facility] for tasks assessed/performed           Past Medical History:  Diagnosis Date  . ANA positive    ?Sjogrens  . Breast neoplasm, Tis (LCIS), left   . Cholecystitis   . Cholelithiasis   . Dry eye   . Fibroid   . Gall stones   . GERD (gastroesophageal reflux disease)   . Hypothyroidism    Takes synthroid  . Infertility, female   . Keratitis 2020  . Lactose intolerance   . Leukopenia   . Sjogren's disease ([place])   . Thrombocytopenia ([place])   . Urticaria    episodes since childhood   . Vitamin D deficiency disease    Takes Vit D  . Weight loss     Past Surgical History:  Procedure Laterality Date  . CHOLECYSTECTOMY N/A 07/13/2014   Procedure: LAPAROSCOPIC CHOLECYSTECTOMY WITH INTRAOPERATIVE CHOLANGIOGRAM;  Surgeon: Rolm Bookbinder, MD;  Location: [place];  Service: General;  Laterality: N/A;  . egg retrieval  2008; 2011; 2012   multiple IVF cycles  . ENDOMETRIAL BIOPSY    . SIMPLE MASTECTOMY WITH AXILLARY SENTINEL NODE BIOPSY Bilateral 07/15/2019   Procedure: RIGHT RISK REDUCING MASTECTOMY AND LEFT MASTECTOMY  WITH LEFT AXILLARY SENTINEL NODE BIOPSY;  Surgeon: Rolm Bookbinder, MD;  Location: [place];  Service: General;  Laterality: Bilateral;  PEC BLOCK  . WISDOM TOOTH EXTRACTION      There were no vitals filed for this visit.   Subjective Assessment - 10/07/19 0922     Subjective I am doing so much better. The cording is much improved. I still have some cording at my anterior and lateral trunk but it is improving. My forearm is improving as well. Overall I am recovering well. My thumb and wrist doesn't hurt anymore either.    Pertinent History L breast neoplasm LCIS, 07/15/19 underwent bilateral mastectomy with L SLNB with 3 negative lymph nodes removed on the left , sjogrens disease/lupus    Patient Stated Goals reduce lymphedema risk and learn post op shoulder ex    Currently in Pain? No/denies   just tender at Lt anterior trunk and scapula                            [facility] Adult PT Treatment/Exercise - 10/07/19 0001      Shoulder Exercises: Standing   Other Standing Exercises Bil UE 3 way raises with 1 lb and x10 each into flexion, scaption and abduction returning therapist demo for each with good technique and done with back against wall/core engaged, and shoulders and head against wall      Manual Therapy   Soft tissue mobilization With coconut oil in Rt S/L  to Lt medial scapular border and upper trap for trigger point release, then in supine to  Lt cervical muscles/scalene for trigger point release    Myofascial Release Briefly to anterior chest and trunk but mild tightness noted by pt and therapist    Scapular Mobilization In Rt S/L to Lt scapula into protraction and retraction, along with depression    Passive ROM In Supine into Rt cervical side bend and rotation with Lt trigger point release    Neural Stretch To Lt UE                  PT Education - 10/07/19 1243    Education Details Bil UE 3 way raises with 1 lb instructing pt in "low and slow" progression of exercises with weights to keep lymphedema risk low    Person(s) Educated Patient    Methods Explanation;Demonstration;Handout    Comprehension Verbalized understanding;Returned demonstration               PT Long Term Goals - 09/10/19 1605      PT LONG TERM  GOAL #1   Title Pt will demonstrate she has regained full shoulder ROM and function post operatively compared to baselines.    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    Time 4    Period Weeks    Status New    Target Date 10/08/19                 Plan - 10/07/19 1244    Clinical Impression Statement Progressed HEP to include bil UE 3 way raises with 1 lb which pt tolerated very well. Also issued red and green theraband for her to self progress supine scapular series at home, also instructing in how to progress to doing this in standing with back against wall. Pt has made excellent gains overall, including with much less cording now and forearm fullness much improved. She would like to decrease frequency to 1x/wk for now working towards D/C.    Personal Factors and Comorbidities Comorbidity 1    Comorbidities sjogrens/lupus mixed disease    Stability/Clinical Decision Making Stable/Uncomplicated    Rehab Potential Excellent    PT Frequency 2x / week    PT Duration 4 weeks    PT Treatment/Interventions ADLs/Self Care Home Management;Patient/family education;Therapeutic exercise;Neuromuscular re-education;Manual lymph drainage;Manual techniques;Passive range of motion;Dry needling;Taping    PT Next Visit Plan Renewal needed next; Review bil UE 3 way raises and progress pt with  strengthening for HEP as she tolerates; cont manual therapy prn for cording, scapular mobs, and MLD    PT Home Exercise Plan post op breast exercises; supine over  towel roll for gentle UE ROM and Meeks Decompression exs; wear compression sleeve and gauntlet daily; supine scapular series; bil UE 3 way raises    Consulted and Agree with Plan of Care Patient           Patient will benefit from skilled therapeutic intervention in order to improve the following deficits and impairments:  Postural dysfunction, Decreased knowledge of precautions, Increased fascial restricitons, Decreased scar mobility  Visit Diagnosis: Aftercare following surgery for neoplasm  Stiffness of left shoulder, not elsewhere classified  Stiffness of right shoulder, not elsewhere classified  Abnormal posture  Lobular carcinoma in situ (LCIS) of left breast  History of bilateral mastectomy     Problem List Patient Active Problem List   Diagnosis Date Noted  . S/P bilateral mastectomy 07/29/2019  . Genetic testing 07/01/2019  . 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 Undifferentiated connective tissue disease ([place]) 03/16/2019  . Sicca syndrome ([place]) 02/23/2019  . Vitamin D deficiency 07/17/2018  . Pancytopenia ([place]) 07/02/2017  . Fibroid uterus 06/21/2017  . Chronic urticaria 05/06/2014  . Autoimmune disorder ([place]) 04/08/2012  . Hypothyroidism 04/08/2012  . Rhinitis 04/08/2012  . ANA positive 04/08/2012  . Infertility, female 11/13/2010  . Thrombocytopenia ([place]) 11/13/2010    Otelia Limes, PTA 10/07/2019, 12:49 PM  [place], [place], [address] Phone: [phone]   Fax:  [phone]  Name: Sarah [name] MRN: [id number] Date of Birth: 01/30/1970

## 2019-10-07 NOTE — Patient Instructions (Signed)

## 2019-10-09 ENCOUNTER — Encounter: Payer: 59 | Admitting: Rehabilitation

## 2019-10-12 ENCOUNTER — Ambulatory Visit: Payer: 59 | Admitting: Physical Therapy

## 2019-10-12 ENCOUNTER — Encounter: Payer: Self-pay | Admitting: Physical Therapy

## 2019-10-12 ENCOUNTER — Other Ambulatory Visit: Payer: Self-pay

## 2019-10-12 DIAGNOSIS — Z483 Aftercare following surgery for neoplasm: Secondary | ICD-10-CM

## 2019-10-12 DIAGNOSIS — M542 Cervicalgia: Secondary | ICD-10-CM | POA: Diagnosis not present

## 2019-10-12 DIAGNOSIS — M25612 Stiffness of left shoulder, not elsewhere classified: Secondary | ICD-10-CM | POA: Diagnosis not present

## 2019-10-12 DIAGNOSIS — Z9013 Acquired absence of bilateral breasts and nipples: Secondary | ICD-10-CM | POA: Diagnosis not present

## 2019-10-12 DIAGNOSIS — M25611 Stiffness of right shoulder, not elsewhere classified: Secondary | ICD-10-CM

## 2019-10-12 DIAGNOSIS — R293 Abnormal posture: Secondary | ICD-10-CM | POA: Diagnosis not present

## 2019-10-12 DIAGNOSIS — D0502 Lobular carcinoma in situ of left breast: Secondary | ICD-10-CM | POA: Diagnosis not present

## 2019-10-12 NOTE — Therapy (Signed)
[facility] [address] [place], [place], [address] Phone: [phone]   Fax:  [phone]  Physical Therapy Treatment  Patient Details  Name: Sarah [name] MRN: [id number] Date of Birth: [birth year] Referring Provider (PT): Riki Altes Date: 10/12/2019   PT End of Session - 10/12/19 0903    Visit Number 19    Number of Visits 24    Date for PT Re-Evaluation 11/09/19    PT Start Time 0810    PT Stop Time 0901    PT Time Calculation (min) 51 min    Activity Tolerance Patient tolerated treatment well    Behavior During Therapy [facility] for tasks assessed/performed           Past Medical History:  Diagnosis Date  . ANA positive    ?Sjogrens  . Breast neoplasm, Tis (LCIS), left   . Cholecystitis   . Cholelithiasis   . Dry eye   . Fibroid   . Gall stones   . GERD (gastroesophageal reflux disease)   . Hypothyroidism    Takes synthroid  . Infertility, female   . Keratitis 2020  . Lactose intolerance   . Leukopenia   . Sjogren's disease (HCC)   . Thrombocytopenia (HCC)   . Urticaria    episodes since childhood   . Vitamin D deficiency disease    Takes Vit D  . Weight loss     Past Surgical History:  Procedure Laterality Date  . CHOLECYSTECTOMY N/A 07/13/2014   Procedure: LAPAROSCOPIC CHOLECYSTECTOMY WITH INTRAOPERATIVE CHOLANGIOGRAM;  Surgeon: Emelia Loron, MD;  Location: [facility] OR;  Service: General;  Laterality: N/A;  . egg retrieval  2008; 2011; 2012   multiple IVF cycles  . ENDOMETRIAL BIOPSY    . SIMPLE MASTECTOMY WITH AXILLARY SENTINEL NODE BIOPSY Bilateral 07/15/2019   Procedure: RIGHT RISK REDUCING MASTECTOMY AND LEFT MASTECTOMY  WITH LEFT AXILLARY SENTINEL NODE BIOPSY;  Surgeon: Emelia Loron, MD;  Location: [facility] OR;  Service: General;  Laterality: Bilateral;  PEC BLOCK  . WISDOM TOOTH EXTRACTION      There were no vitals filed for this visit.   Subjective Assessment - 10/12/19 0811     Subjective I got a new sleeve. Maybe you can help me put on the foam and see if it works. This sleeve is bigger but is still tight at the wrist.    Pertinent History L breast neoplasm LCIS, 07/15/19 underwent bilateral mastectomy with L SLNB with 3 negative lymph nodes removed on the left , sjogrens disease/lupus    Patient Stated Goals reduce lymphedema risk and learn post op shoulder ex    Currently in Pain? Yes    Pain Score 3     Pain Location Chest    Pain Orientation Left    Pain Descriptors / Indicators Tender    Pain Type Surgical pain    Pain Onset More than a month ago    Pain Frequency Constant                             [facility] Adult PT Treatment/Exercise - 10/12/19 0001      Manual Therapy   Edema Management instructed pt in how to don foam "bracelet" over end of sleeve but under gauntlet to decrease increased wrist swelling    Soft tissue mobilization With cocoa butter in Rt S/L  to Lt medial scapular border and upper trap for trigger point release, then in supine  to Lt cervical muscles/scalene for trigger point release, pt has increased tightness in left scalenes and upper traps with numerous trigger points noted    Passive ROM in supine in to R and L side bend and rotation with depression of shoulder to increase stretch                       PT Long Term Goals - 10/12/19 0813      PT LONG TERM GOAL #1   Title Pt will demonstrate she has regained full shoulder ROM and function post operatively compared to baselines.    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    Baseline 10/12/19- 80-90% improvement    Time 4    Period Weeks    Status Achieved      Additional Long Term Goals   Additional Long Term Goals Yes      PT LONG TERM GOAL #6   Title Pt will report 0/10 left sided neck pain to allow pt improved comfort.    Time 4    Period Weeks    Target Date 11/09/19                 Plan - 10/12/19 0817    Clinical Impression Statement Reassessed pt's progress towards goals in therapy. She has met her cording goals but is still having increased neck and thoracic pain as well as increased neck tightness especially on the left side at upper traps, levator and scalenes. Added additional goal to address neck pain today. Pt would benefit from continuation of skilled PT services at 1x/wk for another 4 weeks. Overall she is progressing and her ROM is now full in bilateral shoulders. Continued manual techniques to address neck and upper back pain today. Issued pt info on how to obtain still point inducer to help reduce neck pain and tension as well as a Production assistant, radio.    Personal Factors and Comorbidities Comorbidity 1    Comorbidities sjogrens/lupus mixed disease    Rehab Potential Excellent    PT Frequency 1x / week    PT Duration 4 weeks    PT Treatment/Interventions ADLs/Self Care Home Management;Patient/family education;Therapeutic exercise;Neuromuscular re-education;Manual lymph drainage;Manual techniques;Passive range of motion;Dry needling;Taping    PT Next Visit Plan see if she purchased still point inducer and massager, pt needs to have SOZO done at session the week on 9/27, continue manual therapy to neck and upper back to reduce pain and tightness  PT Home Exercise  Plan post op breast exercises; supine over towel roll for gentle UE ROM and Meeks Decompression exs; wear compression sleeve and gauntlet daily; supine scapular series; bil UE 3 way raises    Consulted and Agree with Plan of Care Patient           Patient will benefit from skilled therapeutic intervention in order to improve the following deficits and impairments:  Postural dysfunction, Decreased knowledge of precautions, Increased fascial restricitons, Decreased scar mobility  Visit Diagnosis: Aftercare following surgery for neoplasm  Stiffness of left shoulder, not elsewhere classified  Stiffness of right shoulder, not elsewhere classified  Cervicalgia     Problem List Patient Active Problem List   Diagnosis Date Noted  . S/P bilateral mastectomy 07/29/2019  . Genetic testing 07/01/2019  . 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 Undifferentiated connective tissue disease ([place]) 03/16/2019  . Sicca syndrome ([place]) 02/23/2019  . Vitamin D deficiency 07/17/2018  . Pancytopenia ([place]) 07/02/2017  . Fibroid uterus 06/21/2017  . Chronic urticaria 05/06/2014  . Autoimmune disorder ([place]) 04/08/2012  . Hypothyroidism 04/08/2012  . Rhinitis 04/08/2012  . ANA positive 04/08/2012  . Infertility, female 11/13/2010  . Thrombocytopenia ([place]) 11/13/2010    Allyson Sabal [facility] 10/12/2019, 12:12 PM  [place], [place], [address] Phone: [phone]   Fax:  [phone]  Name: Amando Ishikawa MRN: [id number] Date of Birth: 10-Apr-1970  Manus Gunning, PT 10/12/19 12:12 PM

## 2019-10-13 ENCOUNTER — Other Ambulatory Visit: Payer: 59

## 2019-10-13 ENCOUNTER — Encounter: Payer: 59 | Admitting: Physical Therapy

## 2019-10-13 DIAGNOSIS — A048 Other specified bacterial intestinal infections: Secondary | ICD-10-CM | POA: Diagnosis not present

## 2019-10-15 LAB — H. PYLORI ANTIGEN, STOOL: H pylori Ag, Stl: NEGATIVE

## 2019-10-19 ENCOUNTER — Encounter: Payer: Self-pay | Admitting: Physical Therapy

## 2019-10-19 ENCOUNTER — Ambulatory Visit: Payer: 59 | Admitting: Physical Therapy

## 2019-10-19 ENCOUNTER — Other Ambulatory Visit: Payer: Self-pay

## 2019-10-19 DIAGNOSIS — M542 Cervicalgia: Secondary | ICD-10-CM

## 2019-10-19 DIAGNOSIS — M25611 Stiffness of right shoulder, not elsewhere classified: Secondary | ICD-10-CM | POA: Diagnosis not present

## 2019-10-19 DIAGNOSIS — M25612 Stiffness of left shoulder, not elsewhere classified: Secondary | ICD-10-CM | POA: Diagnosis not present

## 2019-10-19 DIAGNOSIS — R293 Abnormal posture: Secondary | ICD-10-CM | POA: Diagnosis not present

## 2019-10-19 DIAGNOSIS — Z483 Aftercare following surgery for neoplasm: Secondary | ICD-10-CM | POA: Diagnosis not present

## 2019-10-19 DIAGNOSIS — Z9013 Acquired absence of bilateral breasts and nipples: Secondary | ICD-10-CM | POA: Diagnosis not present

## 2019-10-19 DIAGNOSIS — D0502 Lobular carcinoma in situ of left breast: Secondary | ICD-10-CM | POA: Diagnosis not present

## 2019-10-19 NOTE — Therapy (Signed)
[place], [place], [address] Phone: [phone]   Fax:  [phone]  Physical Therapy Treatment  Patient Details  Name: Sarah [name] MRN: [id number] Date of Birth: 1970-10-12 Referring Provider (PT): Ave Filter Date: 10/19/2019   PT End of Session - 10/19/19 0857    Visit Number 20    Number of Visits 24    Date for PT Re-Evaluation 11/09/19    PT Start Time 0808    PT Stop Time 0855    PT Time Calculation (min) 47 min    Activity Tolerance Patient tolerated treatment well    Behavior During Therapy [facility] for tasks assessed/performed           Past Medical History:  Diagnosis Date  . ANA positive    ?Sjogrens  . Breast neoplasm, Tis (LCIS), left   . Cholecystitis   . Cholelithiasis   . Dry eye   . Fibroid   . Gall stones   . GERD (gastroesophageal reflux disease)   . Hypothyroidism    Takes synthroid  . Infertility, female   . Keratitis 2020  . Lactose intolerance   . Leukopenia   . Sjogren's disease ([place])   . Thrombocytopenia ([place])   . Urticaria    episodes since childhood   . Vitamin D deficiency disease    Takes Vit D  . Weight loss     Past Surgical History:  Procedure Laterality Date  . CHOLECYSTECTOMY N/A 07/13/2014   Procedure: LAPAROSCOPIC CHOLECYSTECTOMY WITH INTRAOPERATIVE CHOLANGIOGRAM;  Surgeon: Rolm Bookbinder, MD;  Location: [place];  Service: General;  Laterality: N/A;  . egg retrieval  2008; 2011; 2012   multiple IVF cycles  . ENDOMETRIAL BIOPSY    . SIMPLE MASTECTOMY WITH AXILLARY SENTINEL NODE BIOPSY Bilateral 07/15/2019   Procedure: RIGHT RISK REDUCING MASTECTOMY AND LEFT MASTECTOMY  WITH LEFT AXILLARY SENTINEL NODE BIOPSY;  Surgeon: Rolm Bookbinder, MD;  Location: [place];  Service: General;  Laterality: Bilateral;  PEC BLOCK  . WISDOM TOOTH EXTRACTION      There were no vitals filed for this visit.   Subjective Assessment - 10/19/19 0809     Subjective I think the foam helped. I could see the veins in my wrist afterwards.    Pertinent History L breast neoplasm LCIS, 07/15/19 underwent bilateral mastectomy with L SLNB with 3 negative lymph nodes removed on the left , sjogrens disease/lupus    Patient Stated Goals reduce lymphedema risk and learn post op shoulder ex    Pain Score 2     Pain Location Shoulder    Pain Orientation Right;Left    Pain Descriptors / Indicators Tightness;Aching    Pain Type Chronic pain    Pain Onset More than a month ago    Pain Frequency Constant                             [facility] Adult PT Treatment/Exercise - 10/19/19 0001      Manual Therapy   Edema Management cut wider foam bracelet- last foam bracelet worked well but might help even more if it was wider    Soft tissue mobilization With cocoa butter in Rt S/L  to Lt medial scapular border and upper trap for trigger point release, then repeated to opposite side, then in supine to Lt cervical muscles/scalene for trigger point release, pt had less tightness this session than last session  PT Long Term Goals - 10/12/19 0813      PT LONG TERM GOAL #1   Title Pt will demonstrate she has regained full shoulder ROM and function post operatively compared to baselines.    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    Baseline 10/12/19- 80-90% improvement    Time 4    Period Weeks    Status Achieved      Additional Long Term Goals   Additional Long Term Goals Yes      PT LONG TERM GOAL #6   Title Pt will report 0/10 left sided neck pain to allow pt improved comfort.    Time 4    Period Weeks    Target Date 11/09/19                 Plan - 10/19/19 0858    Clinical Impression Statement Focused on manual therapy today to upper back, shoulders and neck to decrease pain and tightness. Pt has less tightness than last visit per palpation but still with rope like texture at upper shoulders and scalenes. Pt felt more loose at end of session. Pt would benefit from continued skilled PT services to continue to reduce neck and upper back pain and stiffness.    PT Frequency 1x / week    PT Duration 4 weeks    PT Treatment/Interventions ADLs/Self Care Home Management;Patient/family education;Therapeutic exercise;Neuromuscular re-education;Manual lymph drainage;Manual techniques;Passive range of motion;Dry needling;Taping    PT Next Visit Plan see if she purchased still point inducer and massager, pt needs to have SOZO done at session the week on 9/27, continue manual therapy to neck and upper back to reduce pain and tightness    PT Home Exercise Plan post op breast exercises; supine over towel roll for gentle UE ROM and Meeks Decompression exs; wear compression sleeve and gauntlet daily; supine scapular series; bil UE 3 way raises    Consulted and Agree with Plan of Care Patient           Patient will benefit from skilled therapeutic intervention in order to improve the following deficits and impairments:  Postural dysfunction, Decreased knowledge of precautions, Increased fascial restricitons, Decreased scar mobility  Visit Diagnosis: Aftercare following surgery for neoplasm  Cervicalgia     Problem List Patient Active  Problem List   Diagnosis Date Noted  . S/P bilateral mastectomy 07/29/2019  . Genetic testing 07/01/2019  . 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  Undifferentiated connective tissue disease ([place]) 03/16/2019  . Sicca syndrome ([place]) 02/23/2019  . Vitamin D deficiency 07/17/2018  . Pancytopenia ([place]) 07/02/2017  . Fibroid uterus 06/21/2017  . Chronic urticaria 05/06/2014  . Autoimmune disorder ([place]) 04/08/2012  . Hypothyroidism 04/08/2012  . Rhinitis 04/08/2012  . ANA positive 04/08/2012  . Infertility, female 11/13/2010  . Thrombocytopenia ([place]) 11/13/2010    Allyson Sabal [facility] 10/19/2019, 9:02 AM  [place], [place], [address] Phone: [phone]   Fax:  [phone]  Name: Sarah [name] MRN: [id number] Date of Birth: April 06, 1970  Manus Gunning, PT 10/19/19 9:02 AM

## 2019-10-20 ENCOUNTER — Encounter: Payer: 59 | Admitting: Physical Therapy

## 2019-10-22 ENCOUNTER — Ambulatory Visit: Payer: 59

## 2019-10-26 ENCOUNTER — Other Ambulatory Visit: Payer: Self-pay

## 2019-10-26 ENCOUNTER — Ambulatory Visit: Payer: 59

## 2019-10-26 DIAGNOSIS — Z9013 Acquired absence of bilateral breasts and nipples: Secondary | ICD-10-CM | POA: Diagnosis not present

## 2019-10-26 DIAGNOSIS — D0502 Lobular carcinoma in situ of left breast: Secondary | ICD-10-CM

## 2019-10-26 DIAGNOSIS — R293 Abnormal posture: Secondary | ICD-10-CM

## 2019-10-26 DIAGNOSIS — Z483 Aftercare following surgery for neoplasm: Secondary | ICD-10-CM | POA: Diagnosis not present

## 2019-10-26 DIAGNOSIS — M25612 Stiffness of left shoulder, not elsewhere classified: Secondary | ICD-10-CM | POA: Diagnosis not present

## 2019-10-26 DIAGNOSIS — M542 Cervicalgia: Secondary | ICD-10-CM | POA: Diagnosis not present

## 2019-10-26 DIAGNOSIS — M25611 Stiffness of right shoulder, not elsewhere classified: Secondary | ICD-10-CM

## 2019-10-26 NOTE — Therapy (Signed)
[place], [place], [address] Phone: [phone]   Fax:  [phone]  Physical Therapy Treatment  Patient Details  Name: Sarah [name] MRN: [id number] Date of Birth: 14-Oct-1970 Referring Provider (PT): Donne Hazel   Encounter Date: 10/26/2019   PT End of Session - 10/26/19 1409    Visit Number 21    Number of Visits 24    Date for PT Re-Evaluation 11/09/19    PT Start Time 1302    PT Stop Time 1405    PT Time Calculation (min) 63 min    Activity Tolerance Patient tolerated treatment well    Behavior During Therapy [facility] for tasks assessed/performed           Past Medical History:  Diagnosis Date  . ANA positive    ?Sjogrens  . Breast neoplasm, Tis (LCIS), left   . Cholecystitis   . Cholelithiasis   . Dry eye   . Fibroid   . Gall stones   . GERD (gastroesophageal reflux disease)   . Hypothyroidism    Takes synthroid  . Infertility, female   . Keratitis 2020  . Lactose intolerance   . Leukopenia   . Sjogren's disease ([place])   . Thrombocytopenia ([place])   . Urticaria    episodes since childhood   . Vitamin D deficiency disease    Takes Vit D  . Weight loss     Past Surgical History:  Procedure Laterality Date  . CHOLECYSTECTOMY N/A 07/13/2014   Procedure: LAPAROSCOPIC CHOLECYSTECTOMY WITH INTRAOPERATIVE CHOLANGIOGRAM;  Surgeon: Rolm Bookbinder, MD;  Location: [place];  Service: General;  Laterality: N/A;  . egg retrieval  2008; 2011; 2012   multiple IVF cycles  . ENDOMETRIAL BIOPSY    . SIMPLE MASTECTOMY WITH AXILLARY SENTINEL NODE BIOPSY Bilateral 07/15/2019   Procedure: RIGHT RISK REDUCING MASTECTOMY AND LEFT MASTECTOMY  WITH LEFT AXILLARY SENTINEL NODE BIOPSY;  Surgeon: Rolm Bookbinder, MD;  Location: [place];  Service: General;  Laterality: Bilateral;  PEC BLOCK  . WISDOM TOOTH EXTRACTION      There were no vitals filed for this visit.   Subjective Assessment - 10/26/19 1314     Subjective I reached to pushed a button getting a parking ticket from the parking deck and pulled something in my Lt upper back but it's doing better now. Overall the tightness and pian in my upper back is my biggest complaint. The new foam [name] gave me last itme is helping. I did get the still point inducer but haven't tried it yet.    Pertinent History L breast neoplasm LCIS, 07/15/19 underwent bilateral mastectomy with L SLNB with 3 negative lymph nodes removed on the left , sjogrens disease/lupus    Patient Stated Goals reduce lymphedema risk and learn post op shoulder ex    Currently in Pain? Yes    Pain Score 3     Pain Location Back    Pain Orientation Right;Left    Pain Descriptors / Indicators Tightness;Nagging   deep   Pain Type Chronic pain    Pain Radiating Towards neck, 1-2/10 on Lt    Pain Onset More than a month ago    Pain Frequency Constant    Aggravating Factors  working and over stretching    Pain Relieving Factors gentle stretching and massage/physical therapy                  L-DEX FLOWSHEETS - 10/26/19 1300      L-DEX LYMPHEDEMA  SCREENING   BASELINE SCORE (UNILATERAL) -7.6    L-DEX SCORE (UNILATERAL) -2.8    VALUE CHANGE (UNILAT) 4.8                     [facility] Adult PT Treatment/Exercise - 10/26/19 0001      Manual Therapy   Soft tissue mobilization With coconut oil in supine to bil cervical muscles and paraspinals, then into Rt S/L  to Lt medial and lateral scapular border and upper trap for trigger point release                       PT Long Term Goals - 10/12/19 0813      PT LONG TERM GOAL #1   Title Pt will demonstrate she has regained full shoulder ROM and function post operatively compared to baselines.    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    Baseline 10/12/19- 80-90% improvement    Time 4    Period Weeks    Status Achieved      Additional Long Term Goals   Additional Long Term Goals Yes      PT LONG TERM GOAL #6   Title Pt will report 0/10 left sided neck pain to allow pt improved comfort.    Time 4    Period Weeks    Target Date 11/09/19                 Plan - 10/26/19 1409    Clinical Impression Statement Continued with focus on manual therapy to decrease tight musculature of Lt posterior upper quadrant and bil cervical muscles. Also included trigger point release to Lt periscapular region and at Rt upper trap. Pt repotrs good decresae in overall tension at end of session. Encouraged her, once D/C'd, to resume her acupuncture or massages as she has done in the past to help her continue with progress of reducing trigger points. Pt verbalized understanding and agreed. Redid SOZO today and pts L-Dex score continues to be WNL with a change from baseline of 4.8. Pt was pleased to still be under 6.5 which shows subclinical lymphedema.    Personal Factors and Comorbidities Comorbidity 1    Comorbidities sjogrens/lupus mixed disease    Stability/Clinical Decision Making Stable/Uncomplicated    Rehab Potential Excellent    PT Frequency 1x / week    PT Duration 4 weeks    PT Treatment/Interventions ADLs/Self Care Home Management;Patient/family education;Therapeutic exercise;Neuromuscular re-education;Manual  lymph  drainage;Manual techniques;Passive range of motion;Dry needling;Taping    PT Next Visit Plan see if she got to use her still point inducer and massager/helpful?, continue manual therapy to neck and upper back to reduce pain and tightness and owok towards D/C next week.    PT Home Exercise Plan post op breast exercises; supine over towel roll for gentle UE ROM and Meeks Decompression exs; wear compression sleeve and gauntlet daily; supine scapular series; bil UE 3 way raises    Consulted and Agree with Plan of Care Patient           Patient will benefit from skilled therapeutic intervention in order to improve the following deficits and impairments:  Postural dysfunction, Decreased knowledge of precautions, Increased fascial restricitons, Decreased scar mobility  Visit Diagnosis: Aftercare following surgery for neoplasm  Stiffness of left shoulder, not elsewhere classified  Stiffness of right shoulder, not elsewhere classified  Abnormal posture  Lobular carcinoma in situ (LCIS) of left breast  History of bilateral mastectomy     Problem List Patient Active Problem List   Diagnosis Date Noted  . S/P bilateral mastectomy 07/29/2019  . Genetic testing 07/01/2019  . 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 Undifferentiated connective tissue disease ([place]) 03/16/2019  . 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 Vitamin D deficiency 07/17/2018  . Pancytopenia ([place]) 07/02/2017  . Fibroid uterus 06/21/2017  . Chronic urticaria 05/06/2014  . Autoimmune disorder ([place]) 04/08/2012  . Hypothyroidism 04/08/2012  . Rhinitis 04/08/2012  . ANA positive 04/08/2012  . Infertility, female 11/13/2010  . Thrombocytopenia ([place]) 11/13/2010    Otelia Limes, PTA 10/26/2019, 5:04 PM  [place], [place], [address] Phone: [phone]   Fax:  [phone]  Name: Sarah [name] MRN:  [id number] Date of Birth: Mar 19, 1970

## 2019-10-27 MED FILL — LOTEPREDNOL ETABONATE 0.5 %: 0.5 | 25 days supply | Qty: 5 | Fill #0

## 2019-11-02 ENCOUNTER — Ambulatory Visit: Payer: 59 | Admitting: Physical Therapy

## 2019-11-02 DIAGNOSIS — D0502 Lobular carcinoma in situ of left breast: Secondary | ICD-10-CM | POA: Diagnosis not present

## 2019-11-03 ENCOUNTER — Other Ambulatory Visit: Payer: Self-pay

## 2019-11-03 ENCOUNTER — Ambulatory Visit: Payer: 59 | Attending: General Surgery

## 2019-11-03 DIAGNOSIS — Z9013 Acquired absence of bilateral breasts and nipples: Secondary | ICD-10-CM | POA: Insufficient documentation

## 2019-11-03 DIAGNOSIS — M542 Cervicalgia: Secondary | ICD-10-CM | POA: Diagnosis not present

## 2019-11-03 DIAGNOSIS — Z483 Aftercare following surgery for neoplasm: Secondary | ICD-10-CM | POA: Diagnosis not present

## 2019-11-03 DIAGNOSIS — R29898 Other symptoms and signs involving the musculoskeletal system: Secondary | ICD-10-CM | POA: Insufficient documentation

## 2019-11-03 DIAGNOSIS — R293 Abnormal posture: Secondary | ICD-10-CM | POA: Diagnosis not present

## 2019-11-03 DIAGNOSIS — M25612 Stiffness of left shoulder, not elsewhere classified: Secondary | ICD-10-CM | POA: Insufficient documentation

## 2019-11-03 DIAGNOSIS — D0502 Lobular carcinoma in situ of left breast: Secondary | ICD-10-CM | POA: Insufficient documentation

## 2019-11-03 DIAGNOSIS — M25611 Stiffness of right shoulder, not elsewhere classified: Secondary | ICD-10-CM | POA: Insufficient documentation

## 2019-11-03 NOTE — Therapy (Signed)
[place], [place], [address] Phone: [phone]   Fax:  [phone]  Physical Therapy Treatment  Patient Details  Name: Sarah [name] MRN: [id number] Date of Birth: [birth year] Referring Provider (PT): Ave Filter Date: 11/03/2019   PT End of Session - 11/03/19 0903    Visit Number 22    Number of Visits 24    Date for PT Re-Evaluation 11/09/19    PT Start Time 0807    PT Stop Time 0902    PT Time Calculation (min) 55 min    Activity Tolerance Patient tolerated treatment well    Behavior During Therapy [facility] for tasks assessed/performed           Past Medical History:  Diagnosis Date  . ANA positive    ?Sjogrens  . Breast neoplasm, Tis (LCIS), left   . Cholecystitis   . Cholelithiasis   . Dry eye   . Fibroid   . Gall stones   . GERD (gastroesophageal reflux disease)   . Hypothyroidism    Takes synthroid  . Infertility, female   . Keratitis 2020  . Lactose intolerance   . Leukopenia   . Sjogren's disease ([place])   . Thrombocytopenia ([place])   . Urticaria    episodes since childhood   . Vitamin D deficiency disease    Takes Vit D  . Weight loss     Past Surgical History:  Procedure Laterality Date  . CHOLECYSTECTOMY N/A 07/13/2014   Procedure: LAPAROSCOPIC CHOLECYSTECTOMY WITH INTRAOPERATIVE CHOLANGIOGRAM;  Surgeon: Rolm Bookbinder, MD;  Location: [place];  Service: General;  Laterality: N/A;  . egg retrieval  2008; 2011; 2012   multiple IVF cycles  . ENDOMETRIAL BIOPSY    . SIMPLE MASTECTOMY WITH AXILLARY SENTINEL NODE BIOPSY Bilateral 07/15/2019   Procedure: RIGHT RISK REDUCING MASTECTOMY AND LEFT MASTECTOMY  WITH LEFT AXILLARY SENTINEL NODE BIOPSY;  Surgeon: Rolm Bookbinder, MD;  Location: [facility];  Service: General;  Laterality: Bilateral;  PEC BLOCK  . WISDOM TOOTH EXTRACTION      There were no vitals filed for this visit.   Subjective Assessment - 11/03/19 0809     Subjective I am still feeling the tightness at my scapulae but I think I'm going to have that for a while. And I feel tight along my Rt lateral trunk today, the Lt side feels great. I used my still point inducer and it really helped my shoulder/scapula yet, I haven't found a good place for it for my neck yet though. Or maybe I just didn't use it long enough.    Pertinent History L breast neoplasm LCIS, 07/15/19 underwent bilateral mastectomy with L SLNB with 3 negative lymph nodes removed on the left , sjogrens disease/lupus    Patient Stated Goals reduce lymphedema risk and learn post op shoulder ex    Currently in Pain? No/denies                             [facility] Adult PT Treatment/Exercise - 11/03/19 0001      Manual Therapy   Soft tissue mobilization With coconut oil in supine to bil cervical muscles and paraspinals and Rt lateral trunk, then into Lt S/L  to bil medial and lateral scapular border and upper trap for trigger point release                       PT  Long Term Goals - 10/12/19 0813      PT LONG TERM GOAL #1   Title Pt will demonstrate she has regained full shoulder ROM and function post operatively compared to baselines.    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    Baseline 10/12/19-  80-90% improvement    Time 4    Period Weeks    Status Achieved      Additional Long Term Goals   Additional Long Term Goals Yes      PT LONG TERM GOAL #6   Title Pt will report 0/10 left sided neck pain to allow pt improved comfort.    Time 4    Period Weeks    Target Date 11/09/19                 Plan - 11/03/19 8099    Clinical Impression Statement Continued with focus on manual therapy (see flwosheet) to areas of tightness/tenderness that pt reported. She continues to report good relief after session. Encouraged her to look into a TENS unit for home use to have in addition to her massager to have more options to work on her chronic tightness (areas she knows were tight since before the surgery). Pt will be ready for D/C at next session.    Personal Factors and Comorbidities Comorbidity 1    Comorbidities sjogrens/lupus mixed disease    Stability/Clinical Decision Making Stable/Uncomplicated    Rehab Potential Excellent    PT Frequency 1x / week    PT Duration 4 weeks    PT Treatment/Interventions ADLs/Self Care Home Management;Patient/family education;Therapeutic exercise;Neuromuscular re-education;Manual lymph drainage;Manual techniques;Passive range of motion;Dry needling;Taping    PT Next Visit Plan D/C next session/reassess goals; did she get a TENS unit? questions? Cont manual therapy to areas of reported tightness with trigger point release to neck and upper back.    PT Home Exercise Plan post op breast exercises; supine over towel roll for gentle UE ROM and Meeks Decompression exs; wear compression sleeve and gauntlet daily; supine scapular series; bil UE 3 way raises    Consulted and Agree with Plan of Care Patient           Patient will benefit from skilled therapeutic intervention in order to improve the following deficits and impairments:  Postural dysfunction, Decreased knowledge of precautions, Increased fascial restricitons, Decreased scar mobility  Visit  Diagnosis: Aftercare following surgery for neoplasm  Stiffness of left shoulder, not elsewhere classified  Stiffness of right  shoulder, not elsewhere classified  Abnormal posture  Lobular carcinoma in situ (LCIS) of left breast  History of bilateral mastectomy     Problem List Patient Active Problem List   Diagnosis Date Noted  . S/P bilateral mastectomy 07/29/2019  . Genetic testing 07/01/2019  . 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 Undifferentiated connective tissue disease ([place]) 03/16/2019  . Sicca syndrome ([employer]) 02/23/2019  . Vitamin D deficiency 07/17/2018  . Pancytopenia ([place]) 07/02/2017  . Fibroid uterus 06/21/2017  . Chronic urticaria 05/06/2014  . Autoimmune disorder ([place]) 04/08/2012  . Hypothyroidism 04/08/2012  . Rhinitis 04/08/2012  . ANA positive 04/08/2012  . Infertility, female 11/13/2010  . Thrombocytopenia ([place]) 11/13/2010    Otelia Limes, PTA 11/03/2019, 9:06 AM  [place], [place], [address] Phone: [phone]   Fax:  [phone]  Name: Sarah [name] MRN: [id number] Date of Birth: 1970-09-11

## 2019-11-09 ENCOUNTER — Encounter: Payer: Self-pay | Admitting: Physical Therapy

## 2019-11-09 ENCOUNTER — Other Ambulatory Visit: Payer: Self-pay

## 2019-11-09 ENCOUNTER — Ambulatory Visit: Payer: 59 | Admitting: Physical Therapy

## 2019-11-09 DIAGNOSIS — M542 Cervicalgia: Secondary | ICD-10-CM

## 2019-11-09 DIAGNOSIS — R293 Abnormal posture: Secondary | ICD-10-CM

## 2019-11-09 DIAGNOSIS — Z9013 Acquired absence of bilateral breasts and nipples: Secondary | ICD-10-CM | POA: Diagnosis not present

## 2019-11-09 DIAGNOSIS — Z483 Aftercare following surgery for neoplasm: Secondary | ICD-10-CM | POA: Diagnosis not present

## 2019-11-09 DIAGNOSIS — M25612 Stiffness of left shoulder, not elsewhere classified: Secondary | ICD-10-CM | POA: Diagnosis not present

## 2019-11-09 DIAGNOSIS — D0502 Lobular carcinoma in situ of left breast: Secondary | ICD-10-CM | POA: Diagnosis not present

## 2019-11-09 DIAGNOSIS — R29898 Other symptoms and signs involving the musculoskeletal system: Secondary | ICD-10-CM

## 2019-11-09 DIAGNOSIS — M25611 Stiffness of right shoulder, not elsewhere classified: Secondary | ICD-10-CM | POA: Diagnosis not present

## 2019-11-09 NOTE — Therapy (Signed)
[place], [place], [address] Phone: [phone]   Fax:  [phone]  Physical Therapy Treatment  Patient Details  Name: Sarah [name] MRN: [id number] Date of Birth: [birth year] Referring Provider (PT): Ave Filter Date: 11/09/2019   PT End of Session - 11/09/19 1354    Visit Number 23    Number of Visits 32    Date for PT Re-Evaluation 12/14/19    PT Start Time 0806    PT Stop Time 0903    PT Time Calculation (min) 57 min    Activity Tolerance Patient tolerated treatment well    Behavior During Therapy [facility] for tasks assessed/performed           Past Medical History:  Diagnosis Date   ANA positive    ?Sjogrens   Breast neoplasm, Tis (LCIS), left    Cholecystitis    Cholelithiasis    Dry eye    Fibroid    Gall stones    GERD (gastroesophageal reflux disease)    Hypothyroidism    Takes synthroid   Infertility, female    Keratitis 2020   Lactose intolerance    Leukopenia    Sjogren's disease ([place])    Thrombocytopenia ([place])    Urticaria    episodes since childhood    Vitamin D deficiency disease    Takes Vit D   Weight loss     Past Surgical History:  Procedure Laterality Date   CHOLECYSTECTOMY N/A 07/13/2014   Procedure: LAPAROSCOPIC CHOLECYSTECTOMY WITH INTRAOPERATIVE CHOLANGIOGRAM;  Surgeon: Rolm Bookbinder, MD;  Location: [place];  Service: General;  Laterality: N/A;   egg retrieval  2008; 2011; 2012   multiple IVF cycles   ENDOMETRIAL BIOPSY     SIMPLE MASTECTOMY WITH AXILLARY SENTINEL NODE BIOPSY Bilateral 07/15/2019   Procedure: RIGHT RISK REDUCING MASTECTOMY AND LEFT MASTECTOMY  WITH LEFT AXILLARY SENTINEL NODE BIOPSY;  Surgeon: Rolm Bookbinder, MD;  Location: [place];  Service: General;  Laterality: Bilateral;  PEC BLOCK   WISDOM TOOTH EXTRACTION      There were no vitals filed for this visit.   Subjective Assessment - 11/09/19 0903     Subjective Pt says she gets immediate relief from the massager, and still is using the still point inducer to find the right spot.  She always gets relief from PT session and wants to continue as long as her insurance will cover. She has received releif from dry needling in the past and may want to get a few treatments for that. She has pain every day and wants to get better.  She has very tight trigger points in her neck and upper traps despite of all her efforts of stretching and exercise and use of massager    Pertinent History L breast neoplasm LCIS, 07/15/19 underwent bilateral mastectomy with L SLNB with 3 negative lymph nodes removed on the left , sjogrens disease/lupus    Patient Stated Goals reduce lymphedema risk and learn post op shoulder ex and to get rid of the pain    Currently in Pain? Yes    Pain Score 3     Pain Location Neck    Pain Orientation Right;Left    Pain Descriptors / Indicators Tightness;Nagging;Other (Comment)   deep   Pain Type Chronic pain    Pain Radiating Towards upper back and shoulders    Pain Onset More than a month ago    Pain Frequency Constant    Aggravating Factors  working  Pain Relieving Factors stretching, massage, physical therapy              [facility] PT Assessment - 11/09/19 0001      Assessment   Medical Diagnosis L breast cancer    Referring Provider (PT) Donne Hazel    Onset Date/Surgical Date 07/15/19      Prior Function   Level of Independence Independent                         [facility] Adult PT Treatment/Exercise - 11/09/19 0001      Exercises   Exercises Neck    Other Exercises  neck and upper thoracic ROM, with deep breathing       Manual Therapy   Manual Therapy Soft tissue mobilization;Myofascial release;Manual Traction    Soft tissue mobilization with coconut oil to cervical, upper and posterior shoulder muscles with extra time spent on tight areas     Myofascial Release prolonged pressure on trigger points , to  cervial area     Scapular Mobilization in right and left S/L inferior glide to each scapula for upper trap stretch     Manual Traction in supine to cervical area with myofascial release                   PT Education - 11/09/19 0911    Education Details gave pt information about [employer] center for online yoga, Annamarie Dawley and other activites that she might find helpful. , Massage therapist    Person(s) Educated Patient    Methods Explanation    Comprehension Verbalized understanding               PT Long Term Goals - 11/09/19 1350      PT LONG TERM GOAL #1   Title Pt will demonstrate she has regained full shoulder ROM and function post operatively compared to baselines.    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    Status Achieved      PT LONG TERM GOAL #6   Title Pt will report 0/10 left sided neck pain to allow pt improved comfort.    Time 4    Period Weeks    Status On-going                 Plan - 11/09/19 1341    Clinical Impression Statement Pt has done extremely will with her post mastectomy rehab for shoulder ROM and resolution of cording, but she continues to struggle with neck and upper/mid back pain and trigger points. She gets relief wtih manual work and wants to keep coming to PT for work to these  areas as long as she has PT visit coverage from insuance. She got good relief with dry needling before  so will add this to the plan of care on the recert.  Pt will call back when she checks with her [place] and if she decided to use remaining visits on dry needling or continue with manual work    Personal Factors and Comorbidities Comorbidity 1    Comorbidities sjogrens/lupus mixed disease    Stability/Clinical Decision Making Stable/Uncomplicated    Rehab Potential Excellent    PT Frequency 1x / week    PT Duration 4 weeks    PT Treatment/Interventions ADLs/Self Care Home Management;Patient/family education;Therapeutic exercise;Neuromuscular re-education;Manual lymph drainage;Manual techniques;Passive range of motion;Dry needling;Taping    PT Next Visit Plan Dry needling and stretching exercise or  Cont manual therapy to areas of reported tightness with trigger point release to neck and upper back.    Consulted and Agree with Plan of Care Patient           Patient will benefit from skilled therapeutic intervention in order to improve the following deficits and impairments:  Postural dysfunction, Decreased knowledge of precautions, Increased fascial restricitons, Decreased scar mobility  Visit Diagnosis: Aftercare following surgery for neoplasm - Plan: PT plan of care cert/re-cert  Abnormal posture - Plan: PT plan of care cert/re-cert  Cervicalgia - Plan: PT plan of care cert/re-cert  Other symptoms and signs involving the musculoskeletal system - Plan: PT plan of care cert/re-cert     Problem List Patient Active Problem List   Diagnosis Date Noted   S/P bilateral mastectomy 07/29/2019   Genetic testing 07/01/2019   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 Owens Shark PT  Norwood Levo 11/09/2019, 1:55 PM  [place], [place], [address] Phone: [phone]   Fax:  [phone]  Name: Sarah [name] MRN: [id number] Date of Birth: [birth year]

## 2019-11-10 ENCOUNTER — Encounter: Payer: Self-pay | Admitting: Physical Therapy

## 2019-11-16 DIAGNOSIS — H16223 Keratoconjunctivitis sicca, not specified as Sjogren's, bilateral: Secondary | ICD-10-CM | POA: Diagnosis not present

## 2019-11-16 DIAGNOSIS — Z79899 Other long term (current) drug therapy: Secondary | ICD-10-CM | POA: Diagnosis not present

## 2019-11-16 DIAGNOSIS — H04123 Dry eye syndrome of bilateral lacrimal glands: Secondary | ICD-10-CM | POA: Diagnosis not present

## 2019-11-24 ENCOUNTER — Other Ambulatory Visit (HOSPITAL_COMMUNITY): Payer: Self-pay

## 2019-11-24 MED FILL — CEQUA 0.09 % SOLN: 0.09 | 90 days supply | Qty: 180 | Fill #0

## 2019-11-30 ENCOUNTER — Ambulatory Visit: Payer: 59 | Attending: General Surgery | Admitting: Physical Therapy

## 2019-11-30 ENCOUNTER — Other Ambulatory Visit: Payer: Self-pay

## 2019-11-30 DIAGNOSIS — M25611 Stiffness of right shoulder, not elsewhere classified: Secondary | ICD-10-CM | POA: Diagnosis not present

## 2019-11-30 DIAGNOSIS — R293 Abnormal posture: Secondary | ICD-10-CM | POA: Diagnosis not present

## 2019-11-30 DIAGNOSIS — D0502 Lobular carcinoma in situ of left breast: Secondary | ICD-10-CM | POA: Diagnosis not present

## 2019-11-30 DIAGNOSIS — Z483 Aftercare following surgery for neoplasm: Secondary | ICD-10-CM | POA: Insufficient documentation

## 2019-11-30 DIAGNOSIS — M542 Cervicalgia: Secondary | ICD-10-CM | POA: Insufficient documentation

## 2019-11-30 DIAGNOSIS — M25612 Stiffness of left shoulder, not elsewhere classified: Secondary | ICD-10-CM | POA: Insufficient documentation

## 2019-11-30 DIAGNOSIS — R29898 Other symptoms and signs involving the musculoskeletal system: Secondary | ICD-10-CM | POA: Diagnosis not present

## 2019-11-30 DIAGNOSIS — Z9013 Acquired absence of bilateral breasts and nipples: Secondary | ICD-10-CM | POA: Diagnosis not present

## 2019-12-01 ENCOUNTER — Encounter: Payer: Self-pay | Admitting: Physical Therapy

## 2019-12-01 DIAGNOSIS — D72819 Decreased white blood cell count, unspecified: Secondary | ICD-10-CM | POA: Diagnosis not present

## 2019-12-01 DIAGNOSIS — M3501 Sicca syndrome with keratoconjunctivitis: Secondary | ICD-10-CM | POA: Diagnosis not present

## 2019-12-01 DIAGNOSIS — R5383 Other fatigue: Secondary | ICD-10-CM | POA: Diagnosis not present

## 2019-12-01 DIAGNOSIS — I700 Atherosclerosis of aorta: Secondary | ICD-10-CM | POA: Diagnosis not present

## 2019-12-01 DIAGNOSIS — M3500 Sicca syndrome, unspecified: Secondary | ICD-10-CM | POA: Diagnosis not present

## 2019-12-01 DIAGNOSIS — M351 Other overlap syndromes: Secondary | ICD-10-CM | POA: Diagnosis not present

## 2019-12-01 DIAGNOSIS — Z6820 Body mass index (BMI) 20.0-20.9, adult: Secondary | ICD-10-CM | POA: Diagnosis not present

## 2019-12-01 DIAGNOSIS — C50919 Malignant neoplasm of unspecified site of unspecified female breast: Secondary | ICD-10-CM | POA: Diagnosis not present

## 2019-12-01 DIAGNOSIS — E876 Hypokalemia: Secondary | ICD-10-CM | POA: Diagnosis not present

## 2019-12-01 DIAGNOSIS — D696 Thrombocytopenia, unspecified: Secondary | ICD-10-CM | POA: Diagnosis not present

## 2019-12-01 DIAGNOSIS — R002 Palpitations: Secondary | ICD-10-CM | POA: Diagnosis not present

## 2019-12-01 DIAGNOSIS — R21 Rash and other nonspecific skin eruption: Secondary | ICD-10-CM | POA: Diagnosis not present

## 2019-12-01 DIAGNOSIS — Z79899 Other long term (current) drug therapy: Secondary | ICD-10-CM | POA: Diagnosis not present

## 2019-12-01 DIAGNOSIS — H169 Unspecified keratitis: Secondary | ICD-10-CM | POA: Diagnosis not present

## 2019-12-01 NOTE — Therapy (Signed)
[place], [place], [address] Phone: [phone]   Fax:  [phone]  Physical Therapy Treatment  Patient Details  Name: Sarah [name] MRN: [id number] Date of Birth: 1970/07/02 Referring Provider (PT): Ave Filter Date: 11/30/2019   PT End of Session - 11/30/19 1524    Visit Number 24    Number of Visits 32    Date for PT Re-Evaluation 12/14/19    PT Start Time 2694    PT Stop Time 1458    PT Time Calculation (min) 42 min    Activity Tolerance Patient tolerated treatment well    Behavior During Therapy [facility] for tasks assessed/performed           Past Medical History:  Diagnosis Date   ANA positive    ?Sjogrens   Breast neoplasm, Tis (LCIS), left    Cholecystitis    Cholelithiasis    Dry eye    Fibroid    Gall stones    GERD (gastroesophageal reflux disease)    Hypothyroidism    Takes synthroid   Infertility, female    Keratitis 2020   Lactose intolerance    Leukopenia    Sjogren's disease ([place])    Thrombocytopenia ([place])    Urticaria    episodes since childhood    Vitamin D deficiency disease    Takes Vit D   Weight loss     Past Surgical History:  Procedure Laterality Date   CHOLECYSTECTOMY N/A 07/13/2014   Procedure: LAPAROSCOPIC CHOLECYSTECTOMY WITH INTRAOPERATIVE CHOLANGIOGRAM;  Surgeon: Rolm Bookbinder, MD;  Location: [facility];  Service: General;  Laterality: N/A;   egg retrieval  2008; 2011; 2012   multiple IVF cycles   ENDOMETRIAL BIOPSY     SIMPLE MASTECTOMY WITH AXILLARY SENTINEL NODE BIOPSY Bilateral 07/15/2019   Procedure: RIGHT RISK REDUCING MASTECTOMY AND LEFT MASTECTOMY  WITH LEFT AXILLARY SENTINEL NODE BIOPSY;  Surgeon: Rolm Bookbinder, MD;  Location: [facility];  Service: General;  Laterality: Bilateral;  PEC BLOCK   WISDOM TOOTH EXTRACTION      There were no vitals filed for this visit.   Subjective Assessment - 12/01/19 1258    Subjective  Patient reports continued spasming of bilateral upper traps and into her perscapular area.    Pertinent History L breast neoplasm LCIS, 07/15/19 underwent bilateral mastectomy with L SLNB with 3 negative lymph nodes removed on the left , sjogrens disease/lupus    Patient Stated Goals reduce lymphedema risk and learn post op shoulder ex and to get rid of the pain    Currently in Pain? No/denies                             [facility] Adult PT Treatment/Exercise - 12/01/19 0001      Self-Care   Self-Care Other Self-Care Comments    Other Self-Care Comments  reviewed use of thera-cane for triggerr point release; where to purchase      Manual Therapy   Soft tissue mobilization to upper trap and peri0-scpaular area using IASTYM and manual trigger point release     Manual Traction in supine to cervical area with myofascial release             Trigger Point Dry Needling - 12/01/19 0001    Consent Given? Yes    Education Handout Provided Yes    Muscles Treated Head and Neck Upper trapezius;Levator scapulae;Cervical multifidi    Muscles Treated Back/Hip  Thoracic multifidi    Dry Needling Comments 2 spots in left anfd right upper trap; right levator and right throacic paraspinal T6 on each side     Upper Trapezius Response Twitch reponse elicited;Palpable increased muscle length    Levator Scapulae Response Twitch response elicited    Thoracic multifidi response Twitch response elicited;Palpable increased muscle length                PT Education - 12/01/19 1259    Education Details benefits and risks of TPDN    Person(s) Educated Patient    Methods Explanation;Demonstration;Tactile cues;Verbal cues    Comprehension Verbalized understanding;Returned demonstration;Verbal cues required;Tactile cues required               PT Long Term Goals - 11/09/19 1350      PT LONG TERM GOAL #1   Title Pt will demonstrate she has regained full shoulder ROM and function post  operatively compared to baselines.    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    Status Achieved      PT LONG TERM GOAL #6   Title Pt will report 0/10 left sided neck pain to allow pt improved comfort.    Time 4    Period Weeks    Status On-going                 Plan - 12/01/19 1300    Clinical Impression Statement Therapy focused on manual therapy today and reviewed light stretching. She had a good twtich respnse to all needles. Therapy also reviewed use of thera-can and where to purchase.    Personal Factors and Comorbidities Comorbidity 1    Comorbidities sjogrens/lupus mixed disease    Stability/Clinical Decision Making Stable/Uncomplicated    Clinical Decision Making Low    Rehab Potential Excellent    PT Frequency 1x / week    PT Duration 4 weeks    PT Treatment/Interventions ADLs/Self Care Home Management;Patient/family education;Therapeutic exercise;Neuromuscular re-education;Manual lymph drainage;Manual techniques;Passive range of motion;Dry needling;Taping    PT Next Visit Plan Dry needling and stretching exercise or  Cont manual therapy to areas of reported tightness with trigger point release to neck and upper back.    PT Home Exercise Plan post op breast exercises; supine over towel roll for gentle UE ROM and Meeks Decompression exs; wear compression sleeve and gauntlet daily; supine scapular series; bil UE 3 way raises     Consulted and Agree with Plan of Care Patient           Patient will benefit from skilled therapeutic intervention in order to improve the following deficits and impairments:  Postural dysfunction, Decreased knowledge of precautions,  Increased fascial restricitons, Decreased scar mobility  Visit Diagnosis: Aftercare following surgery for neoplasm  Abnormal posture  Cervicalgia  Other symptoms and signs involving the musculoskeletal system  Stiffness of left shoulder, not elsewhere classified  Stiffness of right shoulder, not elsewhere classified  Lobular carcinoma in situ (LCIS) of left breast  History of bilateral mastectomy     Problem List Patient Active Problem List   Diagnosis Date Noted   S/P bilateral mastectomy 07/29/2019   Genetic testing 07/01/2019   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 06/15/2019   Undifferentiated connective tissue disease ([place]) 03/16/2019   Sicca syndrome ([place]) 02/23/2019   Vitamin D deficiency 07/17/2018   Pancytopenia ([place]) 07/02/2017   Fibroid uterus 06/21/2017   Chronic urticaria 05/06/2014   Autoimmune disorder ([place]) 04/08/2012   Hypothyroidism 04/08/2012   Rhinitis 04/08/2012   ANA positive 04/08/2012   Infertility, female 11/13/2010   Thrombocytopenia ([place]) 11/13/2010    Carney Living  Pt dpt  12/01/2019, 1:07 PM  [facility] [address] [place], [place], [address] Phone: [phone]   Fax:  [phone]  Name: Keishia Ground MRN: [id number] Date of Birth: 1970-10-23

## 2019-12-02 DIAGNOSIS — H04123 Dry eye syndrome of bilateral lacrimal glands: Secondary | ICD-10-CM | POA: Diagnosis not present

## 2019-12-07 ENCOUNTER — Other Ambulatory Visit (HOSPITAL_COMMUNITY): Payer: Self-pay | Admitting: Dermatology

## 2019-12-07 DIAGNOSIS — L308 Other specified dermatitis: Secondary | ICD-10-CM | POA: Diagnosis not present

## 2019-12-07 MED FILL — CLOBETASOL PROPIONATE 0.05: 0.05 | 30 days supply | Qty: 60 | Fill #0

## 2019-12-08 ENCOUNTER — Other Ambulatory Visit: Payer: Self-pay | Admitting: Internal Medicine

## 2019-12-08 DIAGNOSIS — M3500 Sicca syndrome, unspecified: Secondary | ICD-10-CM | POA: Diagnosis not present

## 2019-12-08 DIAGNOSIS — R911 Solitary pulmonary nodule: Secondary | ICD-10-CM

## 2019-12-08 DIAGNOSIS — Z79899 Other long term (current) drug therapy: Secondary | ICD-10-CM | POA: Diagnosis not present

## 2019-12-09 ENCOUNTER — Other Ambulatory Visit (HOSPITAL_COMMUNITY): Payer: Self-pay | Admitting: Rheumatology

## 2019-12-09 MED FILL — predniSONE 1 MG TABS: 1 | 90 days supply | Qty: 180 | Fill #0

## 2019-12-14 MED FILL — HYDROXYCHLOROQUINE SULFATE: 200 | 90 days supply | Qty: 135 | Fill #5

## 2019-12-17 ENCOUNTER — Ambulatory Visit: Payer: 59 | Admitting: Physical Therapy

## 2019-12-17 DIAGNOSIS — Z1239 Encounter for other screening for malignant neoplasm of breast: Secondary | ICD-10-CM | POA: Diagnosis not present

## 2019-12-17 DIAGNOSIS — Z8669 Personal history of other diseases of the nervous system and sense organs: Secondary | ICD-10-CM | POA: Diagnosis not present

## 2019-12-17 DIAGNOSIS — H0288A Meibomian gland dysfunction right eye, upper and lower eyelids: Secondary | ICD-10-CM | POA: Diagnosis not present

## 2019-12-17 DIAGNOSIS — H0288B Meibomian gland dysfunction left eye, upper and lower eyelids: Secondary | ICD-10-CM | POA: Diagnosis not present

## 2019-12-17 DIAGNOSIS — H04123 Dry eye syndrome of bilateral lacrimal glands: Secondary | ICD-10-CM | POA: Diagnosis not present

## 2019-12-22 ENCOUNTER — Ambulatory Visit: Payer: 59 | Admitting: Physical Therapy

## 2019-12-22 ENCOUNTER — Other Ambulatory Visit: Payer: Self-pay

## 2019-12-22 DIAGNOSIS — M25612 Stiffness of left shoulder, not elsewhere classified: Secondary | ICD-10-CM | POA: Diagnosis not present

## 2019-12-22 DIAGNOSIS — R29898 Other symptoms and signs involving the musculoskeletal system: Secondary | ICD-10-CM | POA: Diagnosis not present

## 2019-12-22 DIAGNOSIS — R293 Abnormal posture: Secondary | ICD-10-CM | POA: Diagnosis not present

## 2019-12-22 DIAGNOSIS — M25611 Stiffness of right shoulder, not elsewhere classified: Secondary | ICD-10-CM | POA: Diagnosis not present

## 2019-12-22 DIAGNOSIS — Z483 Aftercare following surgery for neoplasm: Secondary | ICD-10-CM

## 2019-12-22 DIAGNOSIS — M542 Cervicalgia: Secondary | ICD-10-CM | POA: Diagnosis not present

## 2019-12-22 DIAGNOSIS — Z9013 Acquired absence of bilateral breasts and nipples: Secondary | ICD-10-CM | POA: Diagnosis not present

## 2019-12-22 DIAGNOSIS — D0502 Lobular carcinoma in situ of left breast: Secondary | ICD-10-CM

## 2019-12-23 ENCOUNTER — Encounter: Payer: Self-pay | Admitting: Physical Therapy

## 2019-12-23 NOTE — Therapy (Signed)
[place], [place], [address] Phone: [phone]   Fax:  [phone]  Physical Therapy Treatment  Patient Details  Name: Sarah [name] MRN: [id number] Date of Birth: 1970/04/28 Referring Provider (PT): Donne Hazel   Encounter Date: 12/22/2019   PT End of Session - 12/23/19 0823    Visit Number 25    Number of Visits 32    Date for PT Re-Evaluation 12/14/19    PT Start Time 0814    PT Stop Time 1500    PT Time Calculation (min) 43 min    Activity Tolerance Patient tolerated treatment well    Behavior During Therapy [facility] for tasks assessed/performed           Past Medical History:  Diagnosis Date  . ANA positive    ?Sjogrens  . Breast neoplasm, Tis (LCIS), left   . Cholecystitis   . Cholelithiasis   . Dry eye   . Fibroid   . Gall stones   . GERD (gastroesophageal reflux disease)   . Hypothyroidism    Takes synthroid  . Infertility, female   . Keratitis 2020  . Lactose intolerance   . Leukopenia   . Sjogren's disease ([place])   . Thrombocytopenia ([place])   . Urticaria    episodes since childhood   . Vitamin D deficiency disease    Takes Vit D  . Weight loss     Past Surgical History:  Procedure Laterality Date  . CHOLECYSTECTOMY N/A 07/13/2014   Procedure: LAPAROSCOPIC CHOLECYSTECTOMY WITH INTRAOPERATIVE CHOLANGIOGRAM;  Surgeon: Rolm Bookbinder, MD;  Location: [facility];  Service: General;  Laterality: N/A;  . egg retrieval  2008; 2011; 2012   multiple IVF cycles  . ENDOMETRIAL BIOPSY    . SIMPLE MASTECTOMY WITH AXILLARY SENTINEL NODE BIOPSY Bilateral 07/15/2019   Procedure: RIGHT RISK REDUCING MASTECTOMY AND LEFT MASTECTOMY  WITH LEFT AXILLARY SENTINEL NODE BIOPSY;  Surgeon: Rolm Bookbinder, MD;  Location: [facility];  Service: General;  Laterality: Bilateral;  PEC BLOCK  . WISDOM TOOTH EXTRACTION      There were no vitals filed for this visit.   Subjective Assessment - 12/23/19 0810     Subjective Patient reports her left side is pbetter and lloser but she is still having significant pain in her right peri-scpaular area.    Pertinent History L breast neoplasm LCIS, 07/15/19 underwent bilateral mastectomy with L SLNB with 3 negative lymph nodes removed on the left , sjogrens disease/lupus    Patient Stated Goals reduce lymphedema risk and learn post op shoulder ex and to get rid of the pain    Currently in Pain? Yes    Pain Score 3     Pain Location Shoulder    Pain Orientation Right    Pain Descriptors / Indicators Aching    Pain Type Chronic pain    Pain Radiating Towards peri-scpaular srea    Pain Onset More than a month ago    Pain Frequency Constant    Aggravating Factors  working    Pain Relieving Factors stretching    Effect of Pain on Daily Activities no effect                             [facility] Adult PT Treatment/Exercise - 12/23/19 0001      Self-Care   Other Self-Care Comments  reviewed how to use her stretches. Patient is likley pover stretching. She reports at times her  stretches hurt. She was adivsed they should just feel like a pull.       Manual Therapy   Soft tissue mobilization to upper trap and peri0-scpaular area using IASTYM and manual trigger point release     Manual Traction in supine to cervical area with myofascial release             Trigger Point Dry Needling - 12/23/19 0001    Consent Given? Yes    Education Handout Provided Yes    Dry Needling Comments 2 spots needleid in the left and right serratus anteiror; 2 spots needled in the right levator and upper trap; i1 spot needled in the left levator. .3050 needle used for all    Other Dry Needling needling focused on right side today. She was needled in her levator in 2 spots in her right side and her serratus; she was needled in 2 spots in the serratus on the right 2 spots in the levator and 2 spots in the upper trap all using a .3x50 needle     Upper Trapezius Response  Twitch reponse elicited;Palpable increased muscle length    Levator Scapulae Response Twitch response elicited    Thoracic multifidi response Twitch response elicited;Palpable increased muscle length                PT Education - 12/23/19 0823    Education Details stretching in pain free ranges    Person(s) Educated Patient    Methods Explanation;Demonstration;Tactile cues;Verbal cues    Comprehension Verbalized understanding;Returned demonstration;Verbal cues required;Tactile cues required               PT Long Term Goals - 11/09/19 1350      PT LONG TERM GOAL #1   Title Pt will demonstrate she has regained full shoulder ROM and function post operatively compared to baselines.    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    Status Achieved      PT LONG TERM GOAL #6   Title Pt will report 0/10 left sided neck pain to allow pt improved comfort.    Time 4    Period Weeks    Status On-going                 Plan - 12/23/19 0827    Clinical Impression Statement The patient continues to have a significant trigger point in her peri-scpaular/levator. She reports she stretches her neck all day long but at times stretches through pain. She was advised that she does not want to push herslef through painful spots. She is likley exacerbating her rpoblem. She is using her stretches and cane.  She had a great  twithc respose to needlin  in all area. Therapy also perfromed throracic PAs's. Following treatment she reported improved cervical extension. She was encoutraged to continue with posterior chaint her-ex. We have one more visit scheduled. We will re-assess at this time.    Personal Factors and Comorbidities Comorbidity 1    Comorbidities sjogrens/lupus mixed disease    Stability/Clinical Decision Making Stable/Uncomplicated    Clinical Decision Making Low    Rehab Potential Excellent    PT Frequency 1x / week    PT Duration 4 weeks    PT Treatment/Interventions ADLs/Self Care Home Management;Patient/family education;Therapeutic exercise;Neuromuscular re-education;Manual lymph drainage;Manual techniques;Passive range of motion;Dry needling;Taping    PT Next Visit Plan Dry needling and stretching exercise or  Cont manual therapy to areas of reported tightness with trigger point release to neck and upper back.    PT Home Exercise Plan post op breast exercises; supine over towel roll for gentle UE ROM and Meeks Decompression exs; wear compression sleeve and gauntlet daily; supine scapular series; bil UE 3 way raises    Consulted and Agree with Plan of Care Patient           Patient will benefit from skilled therapeutic intervention in order to improve the following deficits and impairments:  Postural dysfunction, Decreased knowledge of precautions, Increased fascial restricitons, Decreased scar mobility  Visit Diagnosis: Aftercare following surgery for neoplasm  Abnormal posture  Cervicalgia  Other symptoms and signs involving the musculoskeletal system  Stiffness of left shoulder, not elsewhere classified  Stiffness of right shoulder, not elsewhere classified  Lobular carcinoma in situ (LCIS) of left breast     Problem List Patient Active Problem List   Diagnosis Date Noted  . S/P bilateral mastectomy 07/29/2019  . Genetic testing 07/01/2019  . 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 Undifferentiated connective tissue disease ([place]) 03/16/2019  . Sicca syndrome ([place]) 02/23/2019  . Vitamin D deficiency 07/17/2018  . Pancytopenia ([place]) 07/02/2017  . Fibroid uterus 06/21/2017  . Chronic urticaria 05/06/2014  . Autoimmune disorder ([place]) 04/08/2012  . Hypothyroidism 04/08/2012  . Rhinitis 04/08/2012  . ANA positive 04/08/2012  . Infertility, female 11/13/2010  . Thrombocytopenia ([place]) 11/13/2010    Carney Living PT DPT  12/23/2019, 10:39 AM  [facility] [address] [place], [place], [address] Phone: [phone]   Fax:  [phone]  Name: Georga Stys MRN: [id number] Date of Birth: Jun 25, 1970

## 2019-12-31 ENCOUNTER — Ambulatory Visit: Payer: 59 | Attending: General Surgery | Admitting: Physical Therapy

## 2019-12-31 ENCOUNTER — Other Ambulatory Visit: Payer: Self-pay

## 2019-12-31 DIAGNOSIS — R29898 Other symptoms and signs involving the musculoskeletal system: Secondary | ICD-10-CM

## 2019-12-31 DIAGNOSIS — M25611 Stiffness of right shoulder, not elsewhere classified: Secondary | ICD-10-CM

## 2019-12-31 DIAGNOSIS — M542 Cervicalgia: Secondary | ICD-10-CM

## 2019-12-31 DIAGNOSIS — M25612 Stiffness of left shoulder, not elsewhere classified: Secondary | ICD-10-CM

## 2019-12-31 DIAGNOSIS — R293 Abnormal posture: Secondary | ICD-10-CM

## 2019-12-31 DIAGNOSIS — D0502 Lobular carcinoma in situ of left breast: Secondary | ICD-10-CM | POA: Diagnosis not present

## 2019-12-31 DIAGNOSIS — Z483 Aftercare following surgery for neoplasm: Secondary | ICD-10-CM | POA: Diagnosis not present

## 2020-01-01 ENCOUNTER — Encounter: Payer: Self-pay | Admitting: Physical Therapy

## 2020-01-01 ENCOUNTER — Ambulatory Visit
Admission: RE | Admit: 2020-01-01 | Discharge: 2020-01-01 | Disposition: A | Discharge status: Home / Self Care | Payer: 59 | Source: Ambulatory Visit | Attending: Internal Medicine | Admitting: Internal Medicine

## 2020-01-01 DIAGNOSIS — R911 Solitary pulmonary nodule: Secondary | ICD-10-CM

## 2020-01-01 NOTE — Therapy (Signed)
[place], [place], [address] Phone: [phone]   Fax:  [phone]  Physical Therapy Treatment  Patient Details  Name: Sarah [name] MRN: [id number] Date of Birth: 17-Apr-1970 Referring Provider (PT): Donne Hazel   Encounter Date: 12/31/2019   PT End of Session - 01/01/20 1024    Visit Number 26    Number of Visits 32    Date for PT Re-Evaluation 12/14/19    PT Start Time 1856    PT Stop Time 1458    PT Time Calculation (min) 43 min    Activity Tolerance Patient tolerated treatment well    Behavior During Therapy [facility] for tasks assessed/performed           Past Medical History:  Diagnosis Date   ANA positive    ?Sjogrens   Breast neoplasm, Tis (LCIS), left    Cholecystitis    Cholelithiasis    Dry eye    Fibroid    Gall stones    GERD (gastroesophageal reflux disease)    Hypothyroidism    Takes synthroid   Infertility, female    Keratitis 2020   Lactose intolerance    Leukopenia    Sjogren's disease ([place])    Thrombocytopenia ([place])    Urticaria    episodes since childhood    Vitamin D deficiency disease    Takes Vit D   Weight loss     Past Surgical History:  Procedure Laterality Date   CHOLECYSTECTOMY N/A 07/13/2014   Procedure: LAPAROSCOPIC CHOLECYSTECTOMY WITH INTRAOPERATIVE CHOLANGIOGRAM;  Surgeon: Rolm Bookbinder, MD;  Location: [facility];  Service: General;  Laterality: N/A;   egg retrieval  2008; 2011; 2012   multiple IVF cycles   ENDOMETRIAL BIOPSY     SIMPLE MASTECTOMY WITH AXILLARY SENTINEL NODE BIOPSY Bilateral 07/15/2019   Procedure: RIGHT RISK REDUCING MASTECTOMY AND LEFT MASTECTOMY  WITH LEFT AXILLARY SENTINEL NODE BIOPSY;  Surgeon: Rolm Bookbinder, MD;  Location: [facility];  Service: General;  Laterality: Bilateral;  PEC BLOCK   WISDOM TOOTH EXTRACTION      There were no vitals filed for this visit.   Subjective Assessment - 01/01/20 0933    Subjective  Patient reports right scapula pain has imporoved significantly since the last session. She feels some tension in her bilateral upper traps.    Pertinent History L breast neoplasm LCIS, 07/15/19 underwent bilateral mastectomy with L SLNB with 3 negative lymph nodes removed on the left , sjogrens disease/lupus    Patient Stated Goals reduce lymphedema risk and learn post op shoulder ex and to get rid of the pain    Currently in Pain? Yes    Pain Score 3     Pain Location Neck    Pain Orientation Right;Left    Pain Descriptors / Indicators Aching    Pain Radiating Towards upper traps    Pain Onset More than a month ago    Pain Frequency Constant    Aggravating Factors  working    Pain Relieving Factors stretching    Effect of Pain on Daily Activities no effect                             [facility] Adult PT Treatment/Exercise - 01/01/20 0001      Self-Care   Other Self-Care Comments  reivewed how to use exercises . Also reviewed how to do needling as moreof an as needed intervention in the future.  Manual Therapy   Manual therapy comments trigger point release     Soft tissue mobilization to upper trap and peri0-scpaular area using IASTYM and manual trigger point release     Manual Traction in supine to cervical area with myofascial release             Trigger Point Dry Needling - 01/01/20 0001    Consent Given? Yes    Education Handout Provided Yes    Upper Trapezius Response Twitch reponse elicited;Palpable increased muscle length    Levator Scapulae Response Twitch response elicited                PT Education - 01/01/20 1019    Education Details use of strethes and exercises    Person(s) Educated Patient    Methods Explanation;Demonstration;Tactile cues;Verbal cues    Comprehension Returned demonstration;Verbal cues required;Tactile cues required;Verbalized understanding               PT Long Term Goals - 11/09/19 1350      PT LONG TERM  GOAL #1   Title Pt will demonstrate she has regained full shoulder ROM and function post operatively compared to baselines.    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    Status Achieved      PT LONG TERM GOAL #6   Title Pt will report 0/10 left sided neck pain to allow pt improved comfort.    Time 4    Period Weeks    Status On-going                 Plan - 01/01/20 1032    Clinical Impression Statement Patient had a great twitch respose on the left and had more resitance to the needle on the right without as much of a twitch. Therapy focused on manual thterapy. We reviewed with her how to use her stretches and exercises. we also reviewed were she can get just dry needling if needed in the furture. She was encouraged to work on the exercises and stretches as well and come back to [place] if more consitent treatment is needed. She will follow up one more time and we will re-assess the plan.    Personal Factors and Comorbidities Comorbidity 1    Comorbidities sjogrens/lupus mixed disease    Stability/Clinical Decision Making Stable/Uncomplicated    Clinical Decision Making Low    Rehab Potential Excellent    PT Frequency 1x / week    PT Duration 4 weeks    PT Treatment/Interventions ADLs/Self Care Home Management;Patient/family education;Therapeutic exercise;Neuromuscular re-education;Manual lymph drainage;Manual techniques;Passive range of  motion;Dry needling;Taping    PT Next Visit Plan Dry needling and stretching exercise  or  Cont manual therapy to areas of reported tightness with trigger point release to neck and upper back.    PT Home Exercise Plan post op breast exercises; supine over towel roll for gentle UE ROM and Meeks Decompression exs; wear compression sleeve and gauntlet daily; supine scapular series; bil UE 3 way raises    Consulted and Agree with Plan of Care Patient           Patient will benefit from skilled therapeutic intervention in order to improve the following deficits and impairments:  Postural dysfunction, Decreased knowledge of precautions, Increased fascial restricitons, Decreased scar mobility  Visit Diagnosis: Aftercare following surgery for neoplasm  Abnormal posture  Cervicalgia  Other symptoms and signs involving the musculoskeletal system  Stiffness of left shoulder, not elsewhere classified  Stiffness of right shoulder, not elsewhere classified  Lobular carcinoma in situ (LCIS) of left breast     Problem List Patient Active Problem List   Diagnosis Date Noted   S/P bilateral mastectomy 07/29/2019   Genetic testing 07/01/2019   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 06/15/2019   Undifferentiated connective tissue disease ([place]) 03/16/2019   Sicca syndrome ([place]) 02/23/2019   Vitamin D deficiency 07/17/2018   Pancytopenia ([place]) 07/02/2017   Fibroid uterus 06/21/2017   Chronic urticaria 05/06/2014   Autoimmune disorder ([place]) 04/08/2012   Hypothyroidism 04/08/2012   Rhinitis 04/08/2012   ANA positive 04/08/2012   Infertility, female 11/13/2010   Thrombocytopenia ([place]) 11/13/2010    Carney Living PT DPT  01/01/2020, 10:53 AM  [facility] [address] [place], [place], [address] Phone: [phone]   Fax:  [phone]  Name: Elmo Shumard MRN: [id number] Date of Birth: 10-09-1970

## 2020-01-04 DIAGNOSIS — H04123 Dry eye syndrome of bilateral lacrimal glands: Secondary | ICD-10-CM | POA: Diagnosis not present

## 2020-01-04 DIAGNOSIS — Z79899 Other long term (current) drug therapy: Secondary | ICD-10-CM | POA: Diagnosis not present

## 2020-01-04 DIAGNOSIS — H16223 Keratoconjunctivitis sicca, not specified as Sjogren's, bilateral: Secondary | ICD-10-CM | POA: Diagnosis not present

## 2020-01-11 DIAGNOSIS — L309 Dermatitis, unspecified: Secondary | ICD-10-CM | POA: Diagnosis not present

## 2020-01-14 ENCOUNTER — Ambulatory Visit: Payer: 59 | Admitting: Physical Therapy

## 2020-01-15 ENCOUNTER — Other Ambulatory Visit (HOSPITAL_COMMUNITY): Payer: Self-pay | Admitting: Dermatology

## 2020-01-15 DIAGNOSIS — L235 Allergic contact dermatitis due to other chemical products: Secondary | ICD-10-CM | POA: Diagnosis not present

## 2020-01-15 MED FILL — TRIAMCINOLONE 0.1% OINTMENT: 0.1 | 21 days supply | Qty: 80 | Fill #0

## 2020-01-19 DIAGNOSIS — H04123 Dry eye syndrome of bilateral lacrimal glands: Secondary | ICD-10-CM | POA: Diagnosis not present

## 2020-01-21 ENCOUNTER — Other Ambulatory Visit (HOSPITAL_COMMUNITY): Payer: Self-pay | Admitting: Dermatology

## 2020-01-21 MED FILL — BETAMETHASONE DP 0.05% OINT: 0.05 | 30 days supply | Qty: 45 | Fill #0

## 2020-01-21 MED FILL — TRIAMCINOLONE 0.5% OINTMENT: 0.5 | 30 days supply | Qty: 60 | Fill #0

## 2020-01-25 ENCOUNTER — Ambulatory Visit: Payer: 59 | Admitting: Physical Therapy

## 2020-01-25 ENCOUNTER — Other Ambulatory Visit: Payer: Self-pay

## 2020-01-25 DIAGNOSIS — R293 Abnormal posture: Secondary | ICD-10-CM

## 2020-01-25 DIAGNOSIS — Z483 Aftercare following surgery for neoplasm: Secondary | ICD-10-CM | POA: Diagnosis not present

## 2020-01-25 DIAGNOSIS — M542 Cervicalgia: Secondary | ICD-10-CM

## 2020-01-25 DIAGNOSIS — D0502 Lobular carcinoma in situ of left breast: Secondary | ICD-10-CM | POA: Diagnosis not present

## 2020-01-25 DIAGNOSIS — M25611 Stiffness of right shoulder, not elsewhere classified: Secondary | ICD-10-CM | POA: Diagnosis not present

## 2020-01-25 DIAGNOSIS — M25612 Stiffness of left shoulder, not elsewhere classified: Secondary | ICD-10-CM | POA: Diagnosis not present

## 2020-01-25 DIAGNOSIS — R29898 Other symptoms and signs involving the musculoskeletal system: Secondary | ICD-10-CM | POA: Diagnosis not present

## 2020-01-26 ENCOUNTER — Encounter: Payer: Self-pay | Admitting: Physical Therapy

## 2020-01-26 NOTE — Therapy (Addendum)
[place], [place], [address] Phone: [phone]   Fax:  [phone]  Physical Therapy Treatment/Progress/Discharge  Patient Details  Name: Sarah [name] MRN: [id number] Date of Birth: 1970-05-06 Referring Provider (PT): Donne Hazel   Encounter Date: 01/25/2020   PT End of Session - 01/26/20 0830    Visit Number 27    Number of Visits 32    Date for PT Re-Evaluation 02/23/20    PT Start Time 0973    PT Stop Time 1456    PT Time Calculation (min) 41 min    Activity Tolerance Patient tolerated treatment well    Behavior During Therapy [facility] for tasks assessed/performed           Past Medical History:  Diagnosis Date  . ANA positive    ?Sjogrens  . Breast neoplasm, Tis (LCIS), left   . Cholecystitis   . Cholelithiasis   . Dry eye   . Fibroid   . Gall stones   . GERD (gastroesophageal reflux disease)   . Hypothyroidism    Takes synthroid  . Infertility, female   . Keratitis 2020  . Lactose intolerance   . Leukopenia   . Sjogren's disease ([place])   . Thrombocytopenia ([place])   . Urticaria    episodes since childhood   . Vitamin D deficiency disease    Takes Vit D  . Weight loss     Past Surgical History:  Procedure Laterality Date  . CHOLECYSTECTOMY N/A 07/13/2014   Procedure: LAPAROSCOPIC CHOLECYSTECTOMY WITH INTRAOPERATIVE CHOLANGIOGRAM;  Surgeon: Rolm Bookbinder, MD;  Location: [facility];  Service: General;  Laterality: N/A;  . egg retrieval  2008; 2011; 2012   multiple IVF cycles  . ENDOMETRIAL BIOPSY    . SIMPLE MASTECTOMY WITH AXILLARY SENTINEL NODE BIOPSY Bilateral 07/15/2019   Procedure: RIGHT RISK REDUCING MASTECTOMY AND LEFT MASTECTOMY  WITH LEFT AXILLARY SENTINEL NODE BIOPSY;  Surgeon: Rolm Bookbinder, MD;  Location: [facility];  Service: General;  Laterality: Bilateral;  PEC BLOCK  . WISDOM TOOTH EXTRACTION      There were no vitals filed for this visit.   Subjective Assessment - 01/26/20  0827    Subjective Patient is making good progress. She has intermittent pain in her upper traps when she is working.  She feels like she is doing better every visit.    Patient Stated Goals reduce lymphedema risk and learn post op shoulder ex and to get rid of the pain    Currently in Pain? No/denies                             [facility] Adult PT Treatment/Exercise - 01/26/20 0001      Self-Care   Other Self-Care Comments  reviewed stretches vs exercises in her plan.      Manual Therapy   Manual therapy comments skilled palpatio of trigger points.    Soft tissue mobilization to upper trap and peri0-scpaular area using IASTYM and manual trigger point release     Manual Traction in supine to cervical area with myofascial release             Trigger Point Dry Needling - 01/26/20 0001    Consent Given? Yes    Education Handout Provided Yes    Dry Needling Comments 3 spots needled in each upper trap 2 spots in left levsator and thoracic peri-scpaular area    Upper Trapezius Response Twitch reponse elicited;Palpable increased  muscle length    Levator Scapulae Response Twitch response elicited    Thoracic multifidi response Twitch response elicited;Palpable increased muscle length                PT Education - 01/26/20 0829    Education Details reviewed how to use her HEP at home    Person(s) Educated Patient    Methods Demonstration;Tactile cues;Verbal cues;Explanation    Comprehension Returned demonstration;Tactile cues required;Verbalized understanding;Verbal cues required               PT Long Term Goals - 11/09/19 1350      PT LONG TERM GOAL #1   Title Pt will demonstrate she has regained full shoulder ROM and function post operatively compared to baselines.    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    Status Achieved      PT LONG TERM GOAL #6   Title Pt will report 0/10 left sided neck pain to allow pt improved comfort.    Time 4    Period Weeks    Status On-going                 Plan - 01/26/20 0830    Clinical Impression Statement Patient has made good progress. She has decreased pain compared to when she started needling. She has full motion of her shoulders. she continues to have some pain when she is sitting and using the computer for loing eperiods of time in both upper traps. She feels like she can take her stretches and exercises and continue to work on it on her onw. We will keep her case open and recert her fro about a month in case she needs one more follow up, but dshe will likley D/C to HEP. She tolerated needling well today, achieving great twitch responses in all muscle groups needled.    Personal Factors and Comorbidities Comorbidity 1    Comorbidities sjogrens/lupus mixed disease    Stability/Clinical Decision Making Stable/Uncomplicated    Clinical Decision Making Low    Rehab Potential Excellent    PT Frequency 1x / week    PT Duration 4 weeks    PT Treatment/Interventions ADLs/Self Care Home Management;Patient/family education;Therapeutic exercise;Neuromuscular re-education;Manual lymph drainage;Manual techniques;Passive range of motion;Dry needling;Taping    PT Next Visit Plan Dry needling and stretching exercise or  Cont manual therapy to areas of reported tightness with trigger point release to neck and upper back.    PT Home Exercise Plan post op breast  exercises; supine over towel roll for gentle UE ROM and Meeks Decompression exs; wear compression sleeve and gauntlet daily; supine scapular series; bil UE 3 way raises  Consulted and Agree with Plan of Care Patient           Patient will benefit from skilled therapeutic intervention in order to improve the following deficits and impairments:  Postural dysfunction,Decreased knowledge of precautions,Increased fascial restricitons,Decreased scar mobility  Visit Diagnosis: Aftercare following surgery for neoplasm - Plan: PT plan of care cert/re-cert  Abnormal posture - Plan: PT plan of care cert/re-cert  Cervicalgia - Plan: PT plan of care cert/re-cert     Problem List Patient Active Problem List   Diagnosis Date Noted  . S/P bilateral mastectomy 07/29/2019  . Genetic testing 07/01/2019  . 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 Undifferentiated connective tissue disease ([place]) 03/16/2019  . Sicca syndrome ([place]) 02/23/2019  . Vitamin D deficiency 07/17/2018  . Pancytopenia ([place]) 07/02/2017  . Fibroid uterus 06/21/2017  . Chronic urticaria 05/06/2014  . Autoimmune disorder ([place]) 04/08/2012  . Hypothyroidism 04/08/2012  . Rhinitis 04/08/2012  . ANA positive 04/08/2012  . Infertility, female 11/13/2010  . Thrombocytopenia ([place]) 11/13/2010   PHYSICAL THERAPY DISCHARGE SUMMARY  Visits from Start of Care: 27  Current functional level related to goals / functional outcomes: Improved pain and fucntion   Remaining deficits: Pain at times   Education / Equipment: HEP   Plan: Patient agrees to discharge.  Patient goals were not met. Patient is being discharged due to meeting the stated rehab goals.  ?????      Carney Living PT DPT  01/26/2020, 8:41 AM  [facility] [address] [place], [place], [address] Phone: [phone]   Fax:  [phone]  Name: Sarah [name] MRN: [id number] Date  of Birth: [birth year]

## 2020-02-01 ENCOUNTER — Other Ambulatory Visit (HOSPITAL_COMMUNITY): Payer: Self-pay | Admitting: Dermatology

## 2020-02-01 MED FILL — CLOBETASOL 0.05% SOLUTION: 0.05 | 30 days supply | Qty: 50 | Fill #0

## 2020-02-24 ENCOUNTER — Other Ambulatory Visit (HOSPITAL_COMMUNITY): Payer: Self-pay | Admitting: Rheumatology

## 2020-02-24 ENCOUNTER — Encounter: Payer: Self-pay | Admitting: Physical Therapy

## 2020-02-24 MED FILL — CEQUA 0.09 % SOLN: 0.09 | 90 days supply | Qty: 180 | Fill #1

## 2020-02-26 MED FILL — HYDROXYCHLOROQUINE SULFATE: 200 | 90 days supply | Qty: 135 | Fill #0

## 2020-03-01 DIAGNOSIS — D696 Thrombocytopenia, unspecified: Secondary | ICD-10-CM | POA: Diagnosis not present

## 2020-03-01 DIAGNOSIS — D72819 Decreased white blood cell count, unspecified: Secondary | ICD-10-CM | POA: Diagnosis not present

## 2020-03-01 DIAGNOSIS — M3500 Sicca syndrome, unspecified: Secondary | ICD-10-CM | POA: Diagnosis not present

## 2020-03-01 DIAGNOSIS — R5383 Other fatigue: Secondary | ICD-10-CM | POA: Diagnosis not present

## 2020-03-01 DIAGNOSIS — H169 Unspecified keratitis: Secondary | ICD-10-CM | POA: Diagnosis not present

## 2020-03-01 DIAGNOSIS — Z6821 Body mass index (BMI) 21.0-21.9, adult: Secondary | ICD-10-CM | POA: Diagnosis not present

## 2020-03-23 DIAGNOSIS — H0288B Meibomian gland dysfunction left eye, upper and lower eyelids: Secondary | ICD-10-CM | POA: Diagnosis not present

## 2020-03-23 DIAGNOSIS — Z8669 Personal history of other diseases of the nervous system and sense organs: Secondary | ICD-10-CM | POA: Diagnosis not present

## 2020-03-23 DIAGNOSIS — H0288A Meibomian gland dysfunction right eye, upper and lower eyelids: Secondary | ICD-10-CM | POA: Diagnosis not present

## 2020-03-23 DIAGNOSIS — H04129 Dry eye syndrome of unspecified lacrimal gland: Secondary | ICD-10-CM | POA: Diagnosis not present

## 2020-04-04 DIAGNOSIS — Z13820 Encounter for screening for osteoporosis: Secondary | ICD-10-CM | POA: Diagnosis not present

## 2020-04-06 DIAGNOSIS — H04129 Dry eye syndrome of unspecified lacrimal gland: Secondary | ICD-10-CM | POA: Diagnosis not present

## 2020-04-06 DIAGNOSIS — H018 Other specified inflammations of eyelid: Secondary | ICD-10-CM | POA: Diagnosis not present

## 2020-04-11 DIAGNOSIS — M3219 Other organ or system involvement in systemic lupus erythematosus: Secondary | ICD-10-CM | POA: Diagnosis not present

## 2020-04-11 DIAGNOSIS — B353 Tinea pedis: Secondary | ICD-10-CM | POA: Diagnosis not present

## 2020-04-11 DIAGNOSIS — L235 Allergic contact dermatitis due to other chemical products: Secondary | ICD-10-CM | POA: Diagnosis not present

## 2020-04-11 DIAGNOSIS — D696 Thrombocytopenia, unspecified: Secondary | ICD-10-CM | POA: Diagnosis not present

## 2020-04-19 ENCOUNTER — Other Ambulatory Visit (HOSPITAL_COMMUNITY): Payer: Self-pay | Admitting: Ophthalmology

## 2020-05-10 DIAGNOSIS — H018 Other specified inflammations of eyelid: Secondary | ICD-10-CM | POA: Diagnosis not present

## 2020-05-10 DIAGNOSIS — H04129 Dry eye syndrome of unspecified lacrimal gland: Secondary | ICD-10-CM | POA: Diagnosis not present

## 2020-05-11 ENCOUNTER — Other Ambulatory Visit (HOSPITAL_COMMUNITY): Payer: Self-pay

## 2020-05-11 MED FILL — Cyclosporine (Ophth) Soln 0.09% (PF): OPHTHALMIC | 90 days supply | Qty: 180 | Fill #0 | Status: AC

## 2020-05-11 MED FILL — Hydroxychloroquine Sulfate Tab 200 MG: ORAL | 90 days supply | Qty: 135 | Fill #0 | Status: CN

## 2020-05-17 ENCOUNTER — Other Ambulatory Visit (HOSPITAL_COMMUNITY): Payer: Self-pay

## 2020-05-23 ENCOUNTER — Other Ambulatory Visit (HOSPITAL_COMMUNITY): Payer: Self-pay

## 2020-05-23 MED ORDER — CARESTART COVID-19 HOME TEST VI KIT
PACK | 0 refills | Status: DC
  Filled 2020-05-23: qty 4, 4d supply, fill #0

## 2020-05-23 MED FILL — Hydroxychloroquine Sulfate Tab 200 MG: ORAL | 90 days supply | Qty: 135 | Fill #0 | Status: AC

## 2020-05-24 DIAGNOSIS — E039 Hypothyroidism, unspecified: Secondary | ICD-10-CM | POA: Diagnosis not present

## 2020-05-24 DIAGNOSIS — Z79899 Other long term (current) drug therapy: Secondary | ICD-10-CM | POA: Diagnosis not present

## 2020-05-24 DIAGNOSIS — Z0000 Encounter for general adult medical examination without abnormal findings: Secondary | ICD-10-CM | POA: Diagnosis not present

## 2020-05-24 DIAGNOSIS — E559 Vitamin D deficiency, unspecified: Secondary | ICD-10-CM | POA: Diagnosis not present

## 2020-05-31 ENCOUNTER — Other Ambulatory Visit (HOSPITAL_COMMUNITY): Payer: Self-pay | Admitting: *Deleted

## 2020-05-31 DIAGNOSIS — E876 Hypokalemia: Secondary | ICD-10-CM | POA: Diagnosis not present

## 2020-05-31 DIAGNOSIS — M8588 Other specified disorders of bone density and structure, other site: Secondary | ICD-10-CM | POA: Diagnosis not present

## 2020-05-31 DIAGNOSIS — C50919 Malignant neoplasm of unspecified site of unspecified female breast: Secondary | ICD-10-CM | POA: Diagnosis not present

## 2020-05-31 DIAGNOSIS — M3501 Sicca syndrome with keratoconjunctivitis: Secondary | ICD-10-CM | POA: Diagnosis not present

## 2020-05-31 DIAGNOSIS — R5383 Other fatigue: Secondary | ICD-10-CM | POA: Diagnosis not present

## 2020-05-31 DIAGNOSIS — M351 Other overlap syndromes: Secondary | ICD-10-CM | POA: Diagnosis not present

## 2020-05-31 DIAGNOSIS — I700 Atherosclerosis of aorta: Secondary | ICD-10-CM | POA: Diagnosis not present

## 2020-05-31 DIAGNOSIS — R82998 Other abnormal findings in urine: Secondary | ICD-10-CM | POA: Diagnosis not present

## 2020-05-31 DIAGNOSIS — Z0000 Encounter for general adult medical examination without abnormal findings: Secondary | ICD-10-CM | POA: Diagnosis not present

## 2020-05-31 DIAGNOSIS — Z1212 Encounter for screening for malignant neoplasm of rectum: Secondary | ICD-10-CM | POA: Diagnosis not present

## 2020-05-31 DIAGNOSIS — Z79899 Other long term (current) drug therapy: Secondary | ICD-10-CM | POA: Diagnosis not present

## 2020-06-02 ENCOUNTER — Ambulatory Visit (HOSPITAL_BASED_OUTPATIENT_CLINIC_OR_DEPARTMENT_OTHER)
Admission: RE | Admit: 2020-06-02 | Discharge: 2020-06-02 | Disposition: A | Discharge status: Home / Self Care | Payer: 59 | Source: Ambulatory Visit | Attending: Cardiology | Admitting: Cardiology

## 2020-06-02 ENCOUNTER — Other Ambulatory Visit: Payer: Self-pay

## 2020-06-15 DIAGNOSIS — M3500 Sicca syndrome, unspecified: Secondary | ICD-10-CM | POA: Diagnosis not present

## 2020-06-15 DIAGNOSIS — D696 Thrombocytopenia, unspecified: Secondary | ICD-10-CM | POA: Diagnosis not present

## 2020-06-15 DIAGNOSIS — R21 Rash and other nonspecific skin eruption: Secondary | ICD-10-CM | POA: Diagnosis not present

## 2020-06-15 DIAGNOSIS — Z79899 Other long term (current) drug therapy: Secondary | ICD-10-CM | POA: Diagnosis not present

## 2020-06-15 DIAGNOSIS — H169 Unspecified keratitis: Secondary | ICD-10-CM | POA: Diagnosis not present

## 2020-06-15 DIAGNOSIS — Z6820 Body mass index (BMI) 20.0-20.9, adult: Secondary | ICD-10-CM | POA: Diagnosis not present

## 2020-06-15 DIAGNOSIS — R5383 Other fatigue: Secondary | ICD-10-CM | POA: Diagnosis not present

## 2020-07-04 DIAGNOSIS — Z79899 Other long term (current) drug therapy: Secondary | ICD-10-CM | POA: Diagnosis not present

## 2020-07-04 DIAGNOSIS — M3500 Sicca syndrome, unspecified: Secondary | ICD-10-CM | POA: Diagnosis not present

## 2020-07-04 DIAGNOSIS — H04123 Dry eye syndrome of bilateral lacrimal glands: Secondary | ICD-10-CM | POA: Diagnosis not present

## 2020-07-04 DIAGNOSIS — H16223 Keratoconjunctivitis sicca, not specified as Sjogren's, bilateral: Secondary | ICD-10-CM | POA: Diagnosis not present

## 2020-07-12 ENCOUNTER — Other Ambulatory Visit: Payer: Self-pay

## 2020-07-12 ENCOUNTER — Other Ambulatory Visit (HOSPITAL_COMMUNITY)
Admission: RE | Admit: 2020-07-12 | Discharge: 2020-07-12 | Disposition: A | Discharge status: Home / Self Care | Payer: 59 | Source: Ambulatory Visit | Attending: Obstetrics & Gynecology | Admitting: Obstetrics & Gynecology

## 2020-07-12 ENCOUNTER — Ambulatory Visit (INDEPENDENT_AMBULATORY_CARE_PROVIDER_SITE_OTHER): Payer: 59 | Admitting: Obstetrics & Gynecology

## 2020-07-12 ENCOUNTER — Encounter (HOSPITAL_BASED_OUTPATIENT_CLINIC_OR_DEPARTMENT_OTHER): Payer: Self-pay | Admitting: Obstetrics & Gynecology

## 2020-07-12 VITALS — BP 109/81 | HR 79 | Ht 63.5 in | Wt 112.0 lb

## 2020-07-12 DIAGNOSIS — Z01419 Encounter for gynecological examination (general) (routine) without abnormal findings: Secondary | ICD-10-CM

## 2020-07-12 DIAGNOSIS — Z124 Encounter for screening for malignant neoplasm of cervix: Secondary | ICD-10-CM | POA: Insufficient documentation

## 2020-07-12 DIAGNOSIS — M8580 Other specified disorders of bone density and structure, unspecified site: Secondary | ICD-10-CM | POA: Diagnosis not present

## 2020-07-12 DIAGNOSIS — D251 Intramural leiomyoma of uterus: Secondary | ICD-10-CM | POA: Diagnosis not present

## 2020-07-12 DIAGNOSIS — D0502 Lobular carcinoma in situ of left breast: Secondary | ICD-10-CM

## 2020-07-12 DIAGNOSIS — Z9013 Acquired absence of bilateral breasts and nipples: Secondary | ICD-10-CM | POA: Diagnosis not present

## 2020-07-12 DIAGNOSIS — D8989 Other specified disorders involving the immune mechanism, not elsewhere classified: Secondary | ICD-10-CM

## 2020-07-12 NOTE — Progress Notes (Addendum)
50 y.o. [id number] Married [place] female here for annual exam.  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  Did genetic testing showing unknown variant.  Tyrer Cusick model showed lifetime risk of breast cancer was >70% so she ultimately decided to have mastectomy done.  Dr. Donne Hazel did surgery.  Did not need any tamoxifen as a result.  Has auto-immune disorder and implants were not recommended.  Had some shoulder stiffness post operatively so did see PT last year.    Did coronary CT with doctor's day this past year.  Score was 0.  DOes have 6 x 2m ground glass nodule that is stable.  Will be followed for 5 years with imaging every 2 years.    H/o prior multiple IVF cycles.  They have decided to not pursue child bearing any longer.         Sexually active: Yes.    The current method of family planning is none and did multiple IVF cycles.    Exercising: Yes.     Smoker:  no  Health Maintenance: Pap:  06/21/2017 Negative History of abnormal Pap:  no MMG:  05/05/2019 Recommended Diagnostic Colonoscopy:  06/15/2019, follow up 10 years BMD:   [facility], -1.7 spine TDaP:  06/2018 Shingrix:   reviewed Hep C testing: 07/02/2019 Screening Labs: with Dr. RVirgina Jock  reports that she has never smoked. She has never used smokeless tobacco. She reports that she does not drink alcohol and does not use drugs.  Past Medical History:  Diagnosis Date   ANA positive    ?Sjogrens   Breast neoplasm, Tis (LCIS), left    Cholecystitis    Cholelithiasis    Dry eye    Fibroid    Gall stones    GERD (gastroesophageal reflux disease)    Hypothyroidism    Takes synthroid   Infertility, female    Keratitis 2020   Lactose intolerance    Leukopenia    Sjogren's disease (HCC)    Thrombocytopenia (HCC)    Urticaria    episodes since childhood    Vitamin D deficiency disease    Takes Vit D   Weight loss     Past Surgical History:   Procedure Laterality Date   CHOLECYSTECTOMY N/A 07/13/2014   Procedure: LAPAROSCOPIC CHOLECYSTECTOMY WITH INTRAOPERATIVE CHOLANGIOGRAM;  Surgeon: MRolm Bookbinder MD;  [facility];  Service: General;  Laterality: N/A;   egg retrieval  2008; 2011; 2012   multiple IVF cycles   ENDOMETRIAL BIOPSY     SIMPLE MASTECTOMY WITH AXILLARY SENTINEL NODE BIOPSY Bilateral 07/15/2019   Procedure: RIGHT RISK REDUCING MASTECTOMY AND LEFT MASTECTOMY  WITH LEFT AXILLARY SENTINEL NODE BIOPSY;  Surgeon: WRolm Bookbinder MD;  [place] [facility]  Service: General;  Laterality: Bilateral;  PEC BLOCK   WISDOM TOOTH EXTRACTION      Current Outpatient Medications  Medication Sig Dispense Refill   betamethasone dipropionate (DIPROLENE) 0.05 % ointment APPLY TOPICALLY 2 (TWO) TIMES DAILY FOR AFFECTED AREAS ON THE BODY. DO NOT APPLY TO FACE, ARMPITS, OR GROIN 45 g 1   clobetasol (TEMOVATE) 0.05 % external solution APPLY TOPICALLY 2 (TWO) TIMES DAILY 50 mL 1   COVID-19 At Home Antigen Test (CARESTART COVID-19 HOME TEST) KIT Use as directed 4 each 0   cycloSPORINE, PF, (CEQUA) 0.09 % SOLN Place 1 drop into both eyes in the morning and at bedtime.      hydroxychloroquine (PLAQUENIL) 200 MG tablet TAKE 2 TABLETS BY  MOUTH EVERY OTHER DAY ALTERNATING WITH 1 TABLET EVERY OTHER DAY. 135 tablet 3   bismuth-metronidazole-tetracycline (PYLERA) 140-125-125 MG capsule Take 3 capsules by mouth 4 (four) times daily -  before meals and at bedtime. (Patient not taking: Reported on 07/12/2020) 120 capsule 0   clobetasol ointment (TEMOVATE) 0.05 % APPLY TOPICALLY TO AFFECTED AREA TWICE DAILY. STOP WHEN SMOOTH. DO NOT APPLY TO FACE OR SKIN FOLDS. (Patient not taking: Reported on 07/12/2020) 60 g 3   cycloSPORINE, PF, 0.09 % SOLN Place 1 drop into both eyes 2 (two) times daily. (Patient not taking: Reported on 07/12/2020) 180 each 4   cycloSPORINE, PF, 0.09 % SOLN APPLY 1 DROP TO EYE 2 TIMES DAILY (Patient not taking: Reported on 07/12/2020)  60 each 5   hydroxychloroquine (PLAQUENIL) 200 MG tablet Take 200 mg by mouth every evening.  (Patient not taking: Reported on 07/12/2020)     methocarbamol (ROBAXIN) 500 MG tablet Take 1 tablet (500 mg total) by mouth 3 (three) times daily. (Patient not taking: Reported on 07/12/2020) 20 tablet 0   oxyCODONE (OXY IR/ROXICODONE) 5 MG immediate release tablet Take 1 tablet (5 mg total) by mouth every 4 (four) hours as needed for moderate pain. (Patient not taking: Reported on 07/12/2020) 10 tablet 0   pantoprazole (PROTONIX) 40 MG tablet Take 1 tablet (40 mg total) by mouth 2 (two) times daily. (Patient not taking: Reported on 07/12/2020) 20 tablet 1   Polyethyl Glycol-Propyl Glycol (SYSTANE OP) Place 1 drop into both eyes every 4 (four) hours as needed (dry eyes). (Patient not taking: Reported on 07/12/2020)     predniSONE (DELTASONE) 1 MG tablet Take 2 mg by mouth daily. (Patient not taking: Reported on 07/12/2020)     predniSONE (DELTASONE) 1 MG tablet TAKE 2 TABLETS (2 MG TOTAL) BY MOUTH ONCE DAILY REDUCE DAILY DOSE AS TOLERATED (Patient not taking: Reported on 07/12/2020) 180 tablet 3   triamcinolone ointment (KENALOG) 0.1 % APPLY 2 TIMES A DAY TO AFFECTED AREAS OF RASH ON BODY. STOP WHEN SMOOTH. (Patient not taking: Reported on 07/12/2020) 80 g 2   triamcinolone ointment (KENALOG) 0.5 % APPLY TOPICALLY 2 (TWO) TIMES DAILY TO THE WORST AREAS OF ECZEMA ON THE BODY, NOT FOR FACE, STOP WHEN SMOOTH (Patient not taking: Reported on 07/12/2020) 60 g 1   UNABLE TO FIND Place 1 drop into both eyes 6 (six) times daily. Platelet rich growth factor eye drops (Patient not taking: Reported on 07/12/2020)     No current facility-administered medications for this visit.    Family History  Problem Relation Age of Onset   Lupus Sister    Thyroid disease Sister    Thyroid disease Mother     Review of Systems  All other systems reviewed and are negative.  Exam:   BP 109/81 (BP Location: Right Arm, Patient Position:  Sitting, Cuff Size: Small)   Pulse 79   Ht 5' 3.5" (1.613 m)   Wt 112 lb (50.8 kg)   LMP 07/11/2020 (Exact Date) Comment: Period started yesterday  BMI 19.53 kg/m   Height: 5' 3.5" (161.3 cm)  General appearance: alert, cooperative and appears stated age Head: Normocephalic, without obvious abnormality, atraumatic Neck: no adenopathy, supple, symmetrical, trachea midline and thyroid normal to inspection and palpation Lungs: clear to auscultation bilaterally Breasts:  s/p mastectomy with bilateral well healed scars, no masses, no LAD Heart: regular rate and rhythm Abdomen: soft, non-tender; bowel sounds normal; no masses,  no organomegaly Extremities: extremities normal, atraumatic, no cyanosis  or edema Skin: Skin color, texture, turgor normal. No rashes or lesions Lymph nodes: Cervical, supraclavicular, and axillary nodes normal. No abnormal inguinal nodes palpated Neurologic: Grossly normal   Pelvic: External genitalia:  no lesions              Urethra:  normal appearing urethra with no masses, tenderness or lesions              Bartholins and Skenes: normal                 Vagina: normal appearing vagina with normal color and no discharge, no lesions              Cervix: no lesions              Pap taken: Yes.   Bimanual Exam:  Uterus:  mildly enlarged at 8 weeks, mobile, non tender              Adnexa: normal adnexa               Rectovaginal: Confirms               Anus:  normal sphincter tone, no lesions  Chaperone, Octaviano Batty, CMA, was present for exam.  Assessment/Plan: 1. Well woman exam with routine gynecological exam - pap and HR HPV obtained today - MMGs no longer needed - colonoscopy 05/2019, follow up 10 years - BMD done in Dr. Keane Police office with mild osteopenia - vaccines reviewed.  Pt is reluctant to get anything updated at this point except covid vaccination and I agree with her.  Did initial series but not booster. - screening lab work done with Dr.  Virgina Jock  2. S/P bilateral mastectomy - MMG no long er needed  3. 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 Autoimmune disorder ([place])  5. Intramural leiomyoma of uterus - smaller on exam today

## 2020-07-13 LAB — CYTOLOGY - PAP
Comment: NEGATIVE
Diagnosis: NEGATIVE
High risk HPV: NEGATIVE

## 2020-07-14 DIAGNOSIS — M8580 Other specified disorders of bone density and structure, unspecified site: Secondary | ICD-10-CM | POA: Insufficient documentation

## 2020-07-28 ENCOUNTER — Encounter: Payer: 59 | Admitting: Family Medicine

## 2020-08-12 ENCOUNTER — Other Ambulatory Visit (HOSPITAL_COMMUNITY): Payer: Self-pay

## 2020-08-12 MED FILL — Cyclosporine (Ophth) Soln 0.09% (PF): OPHTHALMIC | 90 days supply | Qty: 180 | Fill #1 | Status: AC

## 2020-08-12 MED FILL — Hydroxychloroquine Sulfate Tab 200 MG: ORAL | 90 days supply | Qty: 135 | Fill #1 | Status: AC

## 2020-08-30 ENCOUNTER — Ambulatory Visit: Payer: 59

## 2020-09-05 ENCOUNTER — Other Ambulatory Visit (HOSPITAL_COMMUNITY): Payer: Self-pay

## 2020-09-05 MED ORDER — CARESTART COVID-19 HOME TEST VI KIT
PACK | 0 refills | Status: DC
  Filled 2020-09-05: qty 4, 4d supply, fill #0

## 2020-09-12 NOTE — Progress Notes (Signed)
[place] Phone: [phone] Subjective:   Sarah [name], am serving as a scribe for Dr. Hulan Saas.  This visit occurred during the SARS-CoV-2 public health emergency.  Safety protocols were in place, including screening questions prior to the visit, additional usage of staff PPE, and extensive cleaning of exam room while observing appropriate contact time as indicated for disinfecting solutions.   I'm seeing this patient by the request  of:  Shon Baton, MD  CC: neck and back pain   [id number]  Sarah [name] is a 50 y.o. female coming in with complaint of L shoulder and neck pain. Acute pain for one week but has had intermittent pain in this area. Has done PT which helped alleviate her pain for years. Patient had COVID 4 weeks ago and was coughing a lot which exacerbated her pain. [name] pain over mid-line. Uses heat, massage, theracane, and spike balls for trigger points.  Patient has had multiple flares over the course of the next 3 years.  Patient has been running 20 to, 2019 and then this exacerbated after surgery.    Past Medical History:  Diagnosis Date   ANA positive    ?Sjogrens   Breast neoplasm, Tis (LCIS), left    Cholecystitis    Cholelithiasis    Dry eye    Fibroid    Gall stones    GERD (gastroesophageal reflux disease)    Hypothyroidism    Takes synthroid   Infertility, female    Keratitis 2020   Lactose intolerance    Leukopenia    Sjogren's disease (HCC)    Thrombocytopenia (HCC)    Urticaria    episodes since childhood    Vitamin D deficiency disease    Takes Vit D   Weight loss    Past Surgical History:  Procedure Laterality Date   CHOLECYSTECTOMY N/A 07/13/2014   Procedure: LAPAROSCOPIC CHOLECYSTECTOMY WITH INTRAOPERATIVE CHOLANGIOGRAM;  Surgeon: Rolm Bookbinder, MD;  Location: [facility];  Service: General;  Laterality: N/A;   egg retrieval  2008; 2011; 2012   multiple IVF cycles    ENDOMETRIAL BIOPSY     SIMPLE MASTECTOMY WITH AXILLARY SENTINEL NODE BIOPSY Bilateral 07/15/2019   Procedure: RIGHT RISK REDUCING MASTECTOMY AND LEFT MASTECTOMY  WITH LEFT AXILLARY SENTINEL NODE BIOPSY;  Surgeon: Rolm Bookbinder, MD;  Location: [place];  Service: General;  Laterality: Bilateral;  PEC BLOCK   WISDOM TOOTH EXTRACTION     Social History   Socioeconomic History   Marital status: Married    Spouse name: Not on file   Number of children: 0   Years of education: Not on file   Highest education level: Not on file  Occupational History   Occupation: internal medicine physician/ hospitalist    Employer: [employer]  Tobacco Use   Smoking status: Never   Smokeless tobacco: Never  Vaping Use   Vaping Use: Never used  Substance and Sexual Activity   Alcohol use: [name]    Alcohol/week: 0.0 standard drinks   Drug use: [name]   Sexual activity: Yes    Partners: Male    Birth control/protection: Other-see comments    Comment: infertility  Other Topics Concern   Not on file  Social History Narrative   Married   Social Determinants of Health   Financial Resource Strain: Not on file  Food Insecurity: Not on file  Transportation Needs: Not on file  Physical Activity: Not on file  Stress: Not on file  Social Connections: Not on file   Allergies  Allergen Reactions   Lactose Intolerance (Gi) Diarrhea    Stomach pain   Lifitegrast Other (See Comments)    corneal lesion   Latex Rash   Tape Rash   Family History  Problem Relation Age of Onset   Lupus Sister    Thyroid disease Sister    Thyroid disease Mother     Current Outpatient Medications (Endocrine & Metabolic):    predniSONE (DELTASONE) 1 MG tablet, Take 2 mg by mouth daily.   predniSONE (DELTASONE) 1 MG tablet, TAKE 2 TABLETS (2 MG TOTAL) BY MOUTH ONCE DAILY REDUCE DAILY DOSE AS TOLERATED    Current Outpatient Medications (Analgesics):    oxyCODONE (OXY IR/ROXICODONE) 5 MG immediate release tablet,  Take 1 tablet (5 mg total) by mouth every 4 (four) hours as needed for moderate pain.   Current Outpatient Medications (Other):    betamethasone dipropionate (DIPROLENE) 0.05 % ointment, APPLY TOPICALLY 2 (TWO) TIMES DAILY FOR AFFECTED AREAS ON THE BODY. DO NOT APPLY TO FACE, ARMPITS, OR GROIN   bismuth-metronidazole-tetracycline (PYLERA) 140-125-125 MG capsule, Take 3 capsules by mouth 4 (four) times daily -  before meals and at bedtime.   clobetasol (TEMOVATE) 0.05 % external solution, APPLY TOPICALLY 2 (TWO) TIMES DAILY   clobetasol ointment (TEMOVATE) 0.05 %, APPLY TOPICALLY TO AFFECTED AREA TWICE DAILY. STOP WHEN SMOOTH. DO NOT APPLY TO FACE OR SKIN FOLDS.   COVID-19 At Home Antigen Test [facility] COVID-19 HOME TEST) KIT, Use as directed   COVID-19 At Home Antigen Test (CARESTART COVID-19 HOME TEST) KIT, USE AS DIRECTED WITHIN PACKAGE INSTRUCTIONS.   cycloSPORINE, PF, (CEQUA) 0.09 % SOLN, Place 1 drop into both eyes in the morning and at bedtime.    cycloSPORINE, PF, 0.09 % SOLN, Place 1 drop into both eyes 2 (two) times daily.   cycloSPORINE, PF, 0.09 % SOLN, APPLY 1 DROP TO EYE 2 TIMES DAILY   gabapentin (NEURONTIN) 100 MG capsule, Take 2 capsules (200 mg total) by mouth at bedtime.   hydroxychloroquine (PLAQUENIL) 200 MG tablet, Take 200 mg by mouth every evening.   hydroxychloroquine (PLAQUENIL) 200 MG tablet, TAKE 2 TABLETS BY MOUTH EVERY OTHER DAY ALTERNATING WITH 1 TABLET EVERY OTHER DAY.   methocarbamol (ROBAXIN) 500 MG tablet, Take 1 tablet (500 mg total) by mouth 3 (three) times daily.   Polyethyl Glycol-Propyl Glycol (SYSTANE OP), Place 1 drop into both eyes every 4 (four) hours as needed (dry eyes).   triamcinolone ointment (KENALOG) 0.1 %, APPLY 2 TIMES A DAY TO AFFECTED AREAS OF RASH ON BODY. STOP WHEN SMOOTH.   triamcinolone ointment (KENALOG) 0.5 %, APPLY TOPICALLY 2 (TWO) TIMES DAILY TO THE WORST AREAS OF ECZEMA ON THE BODY, NOT FOR FACE, STOP WHEN SMOOTH   UNABLE TO FIND,  Place 1 drop into both eyes 6 (six) times daily. Platelet rich growth factor eye drops   pantoprazole (PROTONIX) 40 MG tablet, Take 1 tablet (40 mg total) by mouth 2 (two) times daily. (Patient not taking: Reported on 07/12/2020)   Reviewed prior external information including notes and imaging from  primary care provider As well as notes that were available from care everywhere and other healthcare systems.  Past medical history, social, surgical and family history all reviewed in electronic medical record.  [name] pertanent information unless stated regarding to the chief complaint.   Review of Systems:  [name] headache, visual changes, nausea, vomiting, diarrhea, constipation, dizziness, abdominal pain, skin  rash, fevers, chills, night sweats, weight loss, swollen lymph nodes, body aches, joint swelling, chest pain, shortness of breath, mood changes. POSITIVE muscle aches  Objective  Blood pressure 122/80, pulse 74, height 5' 3.5" (1.613 m), weight 112 lb (50.8 kg), last menstrual period 09/06/2020, SpO2 99 %.   General: [name] apparent distress alert and oriented x3 mood and affect normal, dressed appropriately.  Patient is very uncomfortable sitting. HEENT: Pupils equal, extraocular movements intact  Respiratory: Patient's speak in full sentences and does not appear short of breath  Cardiovascular: [name] lower extremity edema, non tender, [name] erythema  Gait normal with good balance and coordination.  MSK: Neck exam has significant loss of lordosis.  Patient does have a positive Spurling's on the left side.  Patient does have weakness noted in the C8 distribution and minorly of the C7.  Patient's tricep DTR is 1+ on the left compared to 2+ on the right.   [id number]; 15 additional minutes spent for Therapeutic exercises as stated in above notes.  This included exercises focusing on stretching, strengthening, with significant focus on eccentric aspects.   Long term goals include an improvement in range of  motion, strength, endurance as well as avoiding reinjury. Patient's frequency would include in 1-2 times a day, 3-5 times a week for a duration of 6-12 weeks.  Exercises that included:  Basic scapular stabilization to include adduction and depression of scapula Scaption, focusing on proper movement and good control Internal and External rotation utilizing a theraband, with elbow tucked at side entire time Rows with theraband  Proper technique shown and discussed handout in great detail with ATC.  All questions were discussed and answered.     Impression and Recommendations:     The above documentation has been reviewed and is accurate and complete Lyndal Pulley, DO

## 2020-09-13 ENCOUNTER — Ambulatory Visit (INDEPENDENT_AMBULATORY_CARE_PROVIDER_SITE_OTHER): Payer: 59

## 2020-09-13 ENCOUNTER — Other Ambulatory Visit (HOSPITAL_COMMUNITY): Payer: Self-pay

## 2020-09-13 ENCOUNTER — Encounter: Payer: Self-pay | Admitting: Family Medicine

## 2020-09-13 ENCOUNTER — Other Ambulatory Visit: Payer: Self-pay

## 2020-09-13 ENCOUNTER — Ambulatory Visit (INDEPENDENT_AMBULATORY_CARE_PROVIDER_SITE_OTHER): Payer: 59 | Admitting: Family Medicine

## 2020-09-13 VITALS — BP 122/80 | HR 74 | Ht 63.5 in | Wt 112.0 lb

## 2020-09-13 DIAGNOSIS — M546 Pain in thoracic spine: Secondary | ICD-10-CM

## 2020-09-13 DIAGNOSIS — M542 Cervicalgia: Secondary | ICD-10-CM

## 2020-09-13 DIAGNOSIS — M5010 Cervical disc disorder with radiculopathy, unspecified cervical region: Secondary | ICD-10-CM | POA: Diagnosis not present

## 2020-09-13 MED ORDER — GABAPENTIN 100 MG PO CAPS
200.0000 mg | ORAL_CAPSULE | Freq: Every day | ORAL | 0 refills | Status: DC
  Filled 2020-09-13: qty 180, 90d supply, fill #0

## 2020-09-13 NOTE — Assessment & Plan Note (Signed)
Patient does have more cervical radiculopathy.  I believe with patient's history patient has had 3 flares over the course of multiple years.  Patient did have the history also of the breast cancer so we will get further imaging to further evaluate.  Patient does have autoimmune disorder that could cause some potential inflammation as well we discussed over-the-counter medications that could be beneficial.  We discussed the prescriptions as stated.  Discussed home exercises and patient work with Product/process development scientist.  We discussed the possibility of osteopathic manipulation but held at this time secondary to the severity of the pain and the radicular symptoms patient was having with weakness in the C8 distribution.  Patient will follow-up with me again in 3 to 4 weeks but if worsening pain we may need to consider the possibility of an MRI.

## 2020-09-13 NOTE — Patient Instructions (Signed)
Xray today Gabapentin '200mg'$  at night If not better in 2 weeks, please message [place] and we can get MRI Voltaren gel OTC 2x a day Turmeric '500mg'$  daily  Tart cherry extract '1200mg'$  at night Vitamin D 2000 IU daily  See me again in 4 weeks

## 2020-09-19 ENCOUNTER — Other Ambulatory Visit (HOSPITAL_COMMUNITY): Payer: Self-pay

## 2020-09-20 ENCOUNTER — Ambulatory Visit: Payer: 59 | Admitting: Family Medicine

## 2020-09-21 DIAGNOSIS — Z8669 Personal history of other diseases of the nervous system and sense organs: Secondary | ICD-10-CM | POA: Diagnosis not present

## 2020-09-21 DIAGNOSIS — H018 Other specified inflammations of eyelid: Secondary | ICD-10-CM | POA: Diagnosis not present

## 2020-09-21 DIAGNOSIS — H04123 Dry eye syndrome of bilateral lacrimal glands: Secondary | ICD-10-CM | POA: Diagnosis not present

## 2020-10-10 NOTE — Progress Notes (Signed)
Sarah [name] [facility] [address] [place] Phone: [phone] Subjective:   Sarah [name], am serving as a scribe for Dr. Hulan Saas. This visit occurred during the SARS-CoV-2 public health emergency.  Safety protocols were in place, including screening questions prior to the visit, additional usage of staff PPE, and extensive cleaning of exam room while observing appropriate contact time as indicated for disinfecting solutions.   I'm seeing this patient by the request  of:  Sarah Baton, MD  CC: Neck pain, back pain, knee pain  [id number]  09/13/2020 Patient does have more cervical radiculopathy.  I believe with patient's history patient has had 3 flares over the course of multiple years.  Patient did have the history also of the breast cancer so we will get further imaging to further evaluate.  Patient does have autoimmune disorder that could cause some potential inflammation as well we discussed over-the-counter medications that could be beneficial.  We discussed the prescriptions as stated.  Discussed home exercises and patient work with Product/process development scientist.  We discussed the possibility of osteopathic manipulation but held at this time secondary to the severity of the pain and the radicular symptoms patient was having with weakness in the C8 distribution.  Patient will follow-up with me again in 3 to 4 weeks but if worsening pain we may need to consider the possibility of an MRI.  Update 10/11/2020 Sarah [name] is a 50 y.o. female coming in with complaint of neck pain. She states that she still has pain, but no more radiating pain down her arms and she can sleep at night now without pain.  Patient has been trying to increase activity.  Still with pain.  Does bring up other possibilities including the pain in the lower back as well as the left knee.  States that she did have a ACL rupture of the left knee while skiing multiple years ago.  States that she never  did get it fixed.  Now starting to give her more discomfort.  States that is worse with going up or down stairs.  Does have what she describes as crepitus.  Has not noticed any specific swelling has not stopped her from activity.  Xray 09/13/2020 Cervical IMPRESSION: Mild multilevel degenerative change of the cervical spine. No acute findings.    Xray 09/13/2020 Thoracic IMPRESSION: Mild thoracic spondylosis. No acute osseous findings.         Past Medical History:  Diagnosis Date   ANA positive    ?Sjogrens   Breast neoplasm, Tis (LCIS), left    Cholecystitis    Cholelithiasis    Dry eye    Fibroid    Gall stones    GERD (gastroesophageal reflux disease)    Hypothyroidism    Takes synthroid   Infertility, female    Keratitis 2020   Lactose intolerance    Leukopenia    Sjogren's disease ([place])    Thrombocytopenia ([place])    Urticaria    episodes since childhood    Vitamin D deficiency disease    Takes Vit D   Weight loss    Past Surgical History:  Procedure Laterality Date   CHOLECYSTECTOMY N/A 07/13/2014   Procedure: LAPAROSCOPIC CHOLECYSTECTOMY WITH INTRAOPERATIVE CHOLANGIOGRAM;  Surgeon: Rolm Bookbinder, MD;  Location: [facility];  Service: General;  Laterality: N/A;   egg retrieval  2008; 2011; 2012   multiple IVF cycles   ENDOMETRIAL BIOPSY     SIMPLE MASTECTOMY WITH AXILLARY SENTINEL NODE BIOPSY Bilateral  07/15/2019   Procedure: RIGHT RISK REDUCING MASTECTOMY AND LEFT MASTECTOMY  WITH LEFT AXILLARY SENTINEL NODE BIOPSY;  Surgeon: Rolm Bookbinder, MD;  Location: [facility];  Service: General;  Laterality: Bilateral;  PEC BLOCK   WISDOM TOOTH EXTRACTION     Social History   Socioeconomic History   Marital status: Married    Spouse name: Not on file   Number of children: 0   Years of education: Not on file   Highest education level: Not on file  Occupational History   Occupation: internal medicine physician/ hospitalist    Employer: [employer]  Tobacco  Use   Smoking status: Never   Smokeless tobacco: Never  Vaping Use   Vaping Use: Never used  Substance and Sexual Activity   Alcohol use: No    Alcohol/week: 0.0 standard drinks   Drug use: No   Sexual activity: Yes    Partners: Male    Birth control/protection: Other-see comments    Comment: infertility  Other Topics Concern   Not on file  Social History Narrative   Married   Social Determinants of Health   Financial Resource Strain: Not on file  Food Insecurity: Not on file  Transportation Needs: Not on file  Physical Activity: Not on file  Stress: Not on file  Social Connections: Not on file   Allergies  Allergen Reactions   Lactose Intolerance (Gi) Diarrhea    Stomach pain   Lifitegrast Other (See Comments)    corneal lesion   Latex Rash   Tape Rash   Family History  Problem Relation Age of Onset   Lupus Sister    Thyroid disease Sister    Thyroid disease Mother     Current Outpatient Medications (Endocrine & Metabolic):    predniSONE (DELTASONE) 1 MG tablet, Take 2 mg by mouth daily.   predniSONE (DELTASONE) 1 MG tablet, TAKE 2 TABLETS (2 MG TOTAL) BY MOUTH ONCE DAILY REDUCE DAILY DOSE AS TOLERATED    Current Outpatient Medications (Analgesics):    oxyCODONE (OXY IR/ROXICODONE) 5 MG immediate release tablet, Take 1 tablet (5 mg total) by mouth every 4 (four) hours as needed for moderate pain.   Current Outpatient Medications (Other):    betamethasone dipropionate (DIPROLENE) 0.05 % ointment, APPLY TOPICALLY 2 (TWO) TIMES DAILY FOR AFFECTED AREAS ON THE BODY. DO NOT APPLY TO FACE, ARMPITS, OR GROIN   bismuth-metronidazole-tetracycline (PYLERA) 140-125-125 MG capsule, Take 3 capsules by mouth 4 (four) times daily -  before meals and at bedtime.   clobetasol (TEMOVATE) 0.05 % external solution, APPLY TOPICALLY 2 (TWO) TIMES DAILY   clobetasol ointment (TEMOVATE) 0.05 %, APPLY TOPICALLY TO AFFECTED AREA TWICE DAILY. STOP WHEN SMOOTH. DO NOT APPLY TO FACE OR  SKIN FOLDS.   COVID-19 At Home Antigen Test [facility] COVID-19 HOME TEST) KIT, Use as directed   COVID-19 At Home Antigen Test ([facility] COVID-19 HOME TEST) KIT, USE AS DIRECTED WITHIN PACKAGE INSTRUCTIONS.   cycloSPORINE, PF, (CEQUA) 0.09 % SOLN, Place 1 drop into both eyes in the morning and at bedtime.    cycloSPORINE, PF, 0.09 % SOLN, Place 1 drop into both eyes 2 (two) times daily.   cycloSPORINE, PF, 0.09 % SOLN, APPLY 1 DROP TO EYE 2 TIMES DAILY   gabapentin (NEURONTIN) 100 MG capsule, Take 2 capsules (200 mg total) by mouth at bedtime.   hydroxychloroquine (PLAQUENIL) 200 MG tablet, Take 200 mg by mouth every evening.   hydroxychloroquine (PLAQUENIL) 200 MG tablet, TAKE 2 TABLETS BY MOUTH  EVERY OTHER DAY ALTERNATING WITH 1 TABLET EVERY OTHER DAY.   methocarbamol (ROBAXIN) 500 MG tablet, Take 1 tablet (500 mg total) by mouth 3 (three) times daily.   pantoprazole (PROTONIX) 40 MG tablet, Take 1 tablet (40 mg total) by mouth 2 (two) times daily. (Patient not taking: Reported on 07/12/2020)   Polyethyl Glycol-Propyl Glycol (SYSTANE OP), Place 1 drop into both eyes every 4 (four) hours as needed (dry eyes).   triamcinolone ointment (KENALOG) 0.1 %, APPLY 2 TIMES A DAY TO AFFECTED AREAS OF RASH ON BODY. STOP WHEN SMOOTH.   triamcinolone ointment (KENALOG) 0.5 %, APPLY TOPICALLY 2 (TWO) TIMES DAILY TO THE WORST AREAS OF ECZEMA ON THE BODY, NOT FOR FACE, STOP WHEN SMOOTH   UNABLE TO FIND, Place 1 drop into both eyes 6 (six) times daily. Platelet rich growth factor eye drops   Reviewed prior external information including notes and imaging from  primary care provider As well as notes that were available from care everywhere and other healthcare systems.  Past medical history, social, surgical and family history all reviewed in electronic medical record.  No pertanent information unless stated regarding to the chief complaint.   Review of Systems:  No headache, visual changes, nausea,  vomiting, diarrhea, constipation, dizziness, abdominal pain, skin rash, fevers, chills, night sweats, weight loss, swollen lymph nodes, joint swelling, chest pain, shortness of breath, mood changes. POSITIVE muscle aches, body aches  Objective  Blood pressure 120/82, pulse 70, height _0  (1.6 m), weight 110 lb (49.9 kg), SpO2 96 %.   General: No apparent distress alert and oriented x3 mood and affect normal, dressed appropriately.  HEENT: Pupils equal, extraocular movements intact  Respiratory: Patient's speak in full sentences and does not appear short of breath  Cardiovascular: No lower extremity edema, non tender, no erythema  Gait normal with good balance and coordination.  MSK: Patient's left knee does have some degenerative changes noted.  Some very mild instability noted valgus force.  Patient otherwise has more lateral tracking of the patella with a positive patellar grind test.  Low back exam does have tenderness over the right sacroiliac joint.  Tightness with FABER test.  Negative straight leg test.  Patient does have poor core strength on.   [id number]; 15 additional minutes spent for Therapeutic exercises as stated in above notes.  This included exercises focusing on stretching, strengthening, with significant focus on eccentric aspects.   Long term goals include an improvement in range of motion, strength, endurance as well as avoiding reinjury. Patient's frequency would include in 1-2 times a day, 3-5 times a week for a duration of 6-12 weeks. Low back exercises that included:  Pelvic tilt/bracing instruction to focus on control of the pelvic girdle and lower abdominal muscles  Glute strengthening exercises, focusing on proper firing of the glutes without engaging the low back muscles Proper stretching techniques for maximum relief for the hamstrings, hip flexors, low back and some rotation where tolerated  Proper technique shown and discussed handout in great detail with ATC.  All  questions were discussed and answered.     Impression and Recommendations:     The above documentation has been reviewed and is accurate and complete Lyndal Pulley, DO

## 2020-10-11 ENCOUNTER — Ambulatory Visit (INDEPENDENT_AMBULATORY_CARE_PROVIDER_SITE_OTHER): Payer: 59

## 2020-10-11 ENCOUNTER — Other Ambulatory Visit: Payer: Self-pay

## 2020-10-11 ENCOUNTER — Ambulatory Visit (INDEPENDENT_AMBULATORY_CARE_PROVIDER_SITE_OTHER): Payer: 59 | Admitting: Family Medicine

## 2020-10-11 ENCOUNTER — Encounter: Payer: Self-pay | Admitting: Family Medicine

## 2020-10-11 VITALS — BP 120/82 | HR 70 | Ht 63.0 in | Wt 110.0 lb

## 2020-10-11 DIAGNOSIS — M25562 Pain in left knee: Secondary | ICD-10-CM

## 2020-10-11 DIAGNOSIS — M5136 Other intervertebral disc degeneration, lumbar region: Secondary | ICD-10-CM | POA: Diagnosis not present

## 2020-10-11 DIAGNOSIS — M5010 Cervical disc disorder with radiculopathy, unspecified cervical region: Secondary | ICD-10-CM | POA: Diagnosis not present

## 2020-10-11 DIAGNOSIS — G8929 Other chronic pain: Secondary | ICD-10-CM | POA: Diagnosis not present

## 2020-10-11 NOTE — Assessment & Plan Note (Signed)
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  Discussed icing regimen and home exercises, discussed which activities to do which wants to avoid.  Increase activity slowly.  Follow-up again in 6 to 8 weeks otherwise.

## 2020-10-11 NOTE — Assessment & Plan Note (Signed)
Patellofemoral likely more than anything else.  Patient gives history of potential instability of the knee from an ACL rupture many years ago.  We will see if there is any significant arthritic changes that would support this.  Discussed icing regimen and home exercises.  Follow-up again in 6 weeks worsening pain consider formal physical therapy or injection.

## 2020-10-11 NOTE — Assessment & Plan Note (Signed)
Known degenerative disc disease but patient is responding relatively well to the conservative therapy.  No longer having radicular symptoms but continues to have discomfort and pain.  Discussed with patient at great length today.  We discussed about multiple different problems including her knee as well as her lumbar spine.  Given her some exercises that I think will be beneficial and secondary to the history of the cancer I would like also imaging of these areas.  Depending on how patient responds we will discuss further.  Follow-up with me again in 6 to 8 weeks.

## 2020-10-11 NOTE — Patient Instructions (Addendum)
Do prescribed exercises at least 3x a week Xray today Eat within 30 mins of working out  Would recommend 20grams of protein See you again in 6 weeks

## 2020-10-17 ENCOUNTER — Ambulatory Visit (INDEPENDENT_AMBULATORY_CARE_PROVIDER_SITE_OTHER): Payer: 59 | Admitting: Pulmonary Disease

## 2020-10-17 ENCOUNTER — Other Ambulatory Visit: Payer: Self-pay

## 2020-10-17 ENCOUNTER — Encounter: Payer: Self-pay | Admitting: Pulmonary Disease

## 2020-10-17 VITALS — BP 118/72 | HR 79 | Ht 63.0 in | Wt 109.6 lb

## 2020-10-17 DIAGNOSIS — D0502 Lobular carcinoma in situ of left breast: Secondary | ICD-10-CM

## 2020-10-17 DIAGNOSIS — Z9013 Acquired absence of bilateral breasts and nipples: Secondary | ICD-10-CM | POA: Diagnosis not present

## 2020-10-17 DIAGNOSIS — R911 Solitary pulmonary nodule: Secondary | ICD-10-CM | POA: Diagnosis not present

## 2020-10-17 DIAGNOSIS — Z139 Encounter for screening, unspecified: Secondary | ICD-10-CM | POA: Diagnosis not present

## 2020-10-17 NOTE — Patient Instructions (Addendum)
Thank you for visiting Dr. Valeta Harms at [facility]. Today we recommend the following:  Orders Placed This Encounter  Procedures   CT Super D Chest Wo Contrast   Return in about 8 months (around 06/16/2021) for w/ Dr. Valeta Harms . Please schedule after CT chest is complete.     Please do your part to reduce the spread of COVID-19.

## 2020-10-17 NOTE — Progress Notes (Signed)
Synopsis: Referred in September 2022 for pulmonary nodule by Shon Baton, MD  Subjective:   PATIENT ID: Sarah [name] Reasons GENDER: female DOB: 1970-03-08, MRN: [id number]  Chief Complaint  Patient presents with   Consult    Referred by PCP for lung nodule that was discovered on recent CT. Denies any current respiratory symptoms.      This is a 50 year old female, ANA positive, question of Sjogren's, breast cancer.  December 2021 patient had a small subcentimeter groundglass nodule within the right lower lobe on CT imaging.  She had lobular carcinoma in situ and after risk-benefit discussion with breast surgeon made the decision for bilateral mastectomy versus continued surveillance of her breast for malignancy.  She overall is anxious regarding the lower lobe nodule.  She had a follow-up image from May 2021 CT 30 May 2020 calcium coronary scoring CT which also showed persistence of the groundglass nodule.  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  We discussed the data on the risk of malignancy associated with groundglass opacities in the Asian descent.  Some data would suggest upwards of 40 to 50% of persistent GGO's may be consistent with malignancy.  Due to the size of her lesion we discussed neck steps to include close surveillance.    Past Medical History:  Diagnosis Date   ANA positive    ?Sjogrens   Breast neoplasm, Tis (LCIS), left    Cholecystitis    Cholelithiasis    Dry eye    Fibroid    Gall stones    GERD (gastroesophageal reflux disease)    Hypothyroidism    Takes synthroid   Infertility, female    Keratitis 2020   Lactose intolerance    Leukopenia    Sjogren's disease (HCC)    Thrombocytopenia (HCC)    Urticaria    episodes since childhood    Vitamin D deficiency disease    Takes Vit D   Weight loss      Family History  Problem Relation Age of Onset   Lupus Sister    Thyroid disease Sister    Thyroid disease Mother      Past Surgical History:  Procedure Laterality Date    CHOLECYSTECTOMY N/A 07/13/2014   Procedure: LAPAROSCOPIC CHOLECYSTECTOMY WITH INTRAOPERATIVE CHOLANGIOGRAM;  Surgeon: Rolm Bookbinder, MD;  Location: [facility];  Service: General;  Laterality: N/A;   egg retrieval  2008; 2011; 2012   multiple IVF cycles   ENDOMETRIAL BIOPSY     SIMPLE MASTECTOMY WITH AXILLARY SENTINEL NODE BIOPSY Bilateral 07/15/2019   Procedure: RIGHT RISK REDUCING MASTECTOMY AND LEFT MASTECTOMY  WITH LEFT AXILLARY SENTINEL NODE BIOPSY;  Surgeon: Rolm Bookbinder, MD;  Location: [facility];  Service: General;  Laterality: Bilateral;  PEC BLOCK   WISDOM TOOTH EXTRACTION      Social History   Socioeconomic History   Marital status: Married    Spouse name: Not on file   Number of children: 0   Years of education: Not on file   Highest education level: Not on file  Occupational History   Occupation: internal medicine physician/ hospitalist    Employer: [employer]  Tobacco Use   Smoking status: Never   Smokeless tobacco: Never  Vaping Use   Vaping Use: Never used  Substance and Sexual Activity   Alcohol use: No    Alcohol/week: 0.0 standard drinks   Drug use: No   Sexual activity: Yes    Partners: Male    Birth control/protection: Other-see comments    Comment: infertility  Other Topics Concern   Not on file  Social History Narrative   Married   Social Determinants of Health   Financial Resource Strain: Not on file  Food Insecurity: Not on file  Transportation Needs: Not on file  Physical Activity: Not on file  Stress: Not on file  Social Connections: Not on file  Intimate Partner Violence: Not on file     Allergies  Allergen Reactions   Lactose Intolerance (Gi) Diarrhea    Stomach pain   Lifitegrast Other (See Comments)    corneal lesion   Latex Rash   Tape Rash     Outpatient Medications Prior to Visit  Medication Sig Dispense Refill   betamethasone dipropionate (DIPROLENE) 0.05 % ointment APPLY TOPICALLY 2 (TWO) TIMES DAILY FOR AFFECTED  AREAS ON THE BODY. DO NOT APPLY TO FACE, ARMPITS, OR GROIN 45 g 1   bismuth-metronidazole-tetracycline (PYLERA) 140-125-125 MG capsule Take 3 capsules by mouth 4 (four) times daily -  before meals and at bedtime. 120 capsule 0   clobetasol (TEMOVATE) 0.05 % external solution APPLY TOPICALLY 2 (TWO) TIMES DAILY 50 mL 1   clobetasol ointment (TEMOVATE) 0.05 % APPLY TOPICALLY TO AFFECTED AREA TWICE DAILY. STOP WHEN SMOOTH. DO NOT APPLY TO FACE OR SKIN FOLDS. 60 g 3   cycloSPORINE, PF, (CEQUA) 0.09 % SOLN Place 1 drop into both eyes in the morning and at bedtime.      cycloSPORINE, PF, 0.09 % SOLN Place 1 drop into both eyes 2 (two) times daily. 180 each 4   cycloSPORINE, PF, 0.09 % SOLN APPLY 1 DROP TO EYE 2 TIMES DAILY 60 each 5   gabapentin (NEURONTIN) 100 MG capsule Take 2 capsules (200 mg total) by mouth at bedtime. 180 capsule 0   hydroxychloroquine (PLAQUENIL) 200 MG tablet Take 200 mg by mouth every evening.     hydroxychloroquine (PLAQUENIL) 200 MG tablet TAKE 2 TABLETS BY MOUTH EVERY OTHER DAY ALTERNATING WITH 1 TABLET EVERY OTHER DAY. 135 tablet 3   methocarbamol (ROBAXIN) 500 MG tablet Take 1 tablet (500 mg total) by mouth 3 (three) times daily. 20 tablet 0   oxyCODONE (OXY IR/ROXICODONE) 5 MG immediate release tablet Take 1 tablet (5 mg total) by mouth every 4 (four) hours as needed for moderate pain. 10 tablet 0   Polyethyl Glycol-Propyl Glycol (SYSTANE OP) Place 1 drop into both eyes every 4 (four) hours as needed (dry eyes).     triamcinolone ointment (KENALOG) 0.1 % APPLY 2 TIMES A DAY TO AFFECTED AREAS OF RASH ON BODY. STOP WHEN SMOOTH. 80 g 2   triamcinolone ointment (KENALOG) 0.5 % APPLY TOPICALLY 2 (TWO) TIMES DAILY TO THE WORST AREAS OF ECZEMA ON THE BODY, NOT FOR FACE, STOP WHEN SMOOTH 60 g 1   UNABLE TO FIND Place 1 drop into both eyes 6 (six) times daily. Platelet rich growth factor eye drops     Vitamin D, Ergocalciferol, 50000 units CAPS 1 capsule     hydroxychloroquine  (PLAQUENIL) 200 MG tablet 1 tab alt 2 tab     COVID-19 At Home Antigen Test (CARESTART COVID-19 HOME TEST) KIT Use as directed 4 each 0   COVID-19 At Home Antigen Test (CARESTART COVID-19 HOME TEST) KIT USE AS DIRECTED WITHIN PACKAGE INSTRUCTIONS. 4 each 0   pantoprazole (PROTONIX) 40 MG tablet Take 1 tablet (40 mg total) by mouth 2 (two) times daily. (Patient not taking: Reported on 07/12/2020) 20 tablet 1   predniSONE (DELTASONE) 1 MG tablet Take 2 mg  by mouth daily.     predniSONE (DELTASONE) 1 MG tablet TAKE 2 TABLETS (2 MG TOTAL) BY MOUTH ONCE DAILY REDUCE DAILY DOSE AS TOLERATED 180 tablet 3   No facility-administered medications prior to visit.    Review of Systems  Constitutional:  Negative for chills, fever, malaise/fatigue and weight loss.  HENT:  Negative for hearing loss, sore throat and tinnitus.   Eyes:  Negative for blurred vision and double vision.  Respiratory:  Negative for cough, hemoptysis, sputum production, shortness of breath, wheezing and stridor.   Cardiovascular:  Negative for chest pain, palpitations, orthopnea, leg swelling and PND.  Gastrointestinal:  Negative for abdominal pain, constipation, diarrhea, heartburn, nausea and vomiting.  Genitourinary:  Negative for dysuria, hematuria and urgency.  Musculoskeletal:  Negative for joint pain and myalgias.  Skin:  Negative for itching and rash.  Neurological:  Negative for dizziness, tingling, weakness and headaches.  Endo/Heme/Allergies:  Negative for environmental allergies. Does not bruise/bleed easily.  Psychiatric/Behavioral:  Negative for depression. The patient is not nervous/anxious and does not have insomnia.   All other systems reviewed and are negative.   Objective:  Physical Exam Vitals reviewed.  Constitutional:      General: She is not in acute distress.    Appearance: She is well-developed.  HENT:     Head: Normocephalic and atraumatic.  Eyes:     General: No scleral icterus.     Conjunctiva/sclera: Conjunctivae normal.     Pupils: Pupils are equal, round, and reactive to light.  Neck:     Vascular: No JVD.     Trachea: No tracheal deviation.  Cardiovascular:     Rate and Rhythm: Normal rate and regular rhythm.     Heart sounds: Normal heart sounds. No murmur heard. Pulmonary:     Effort: Pulmonary effort is normal. No tachypnea, accessory muscle usage or respiratory distress.     Breath sounds: No stridor. No wheezing, rhonchi or rales.  Abdominal:     General: Bowel sounds are normal. There is no distension.     Palpations: Abdomen is soft.     Tenderness: There is no abdominal tenderness.  Musculoskeletal:        General: No tenderness.     Cervical back: Neck supple.  Lymphadenopathy:     Cervical: No cervical adenopathy.  Skin:    General: Skin is warm and dry.     Capillary Refill: Capillary refill takes less than 2 seconds.     Findings: No rash.  Neurological:     Mental Status: She is alert and oriented to person, place, and time.  Psychiatric:        Behavior: Behavior normal.     Vitals:   10/17/20 0932  BP: 118/72  Pulse: 79  SpO2: 100%  Weight: 109 lb 9.6 oz (49.7 kg)  Height: _0  (1.6 m)   100% on RA BMI Readings from Last 3 Encounters:  10/17/20 19.41 kg/m  10/11/20 19.49 kg/m  09/13/20 19.53 kg/m   Wt Readings from Last 3 Encounters:  10/17/20 109 lb 9.6 oz (49.7 kg)  10/11/20 110 lb (49.9 kg)  09/13/20 112 lb (50.8 kg)     CBC    Component Value Date/Time   WBC 3.0 (L) 07/14/2019 0935   RBC 3.94 07/14/2019 0935   HGB 12.5 07/14/2019 0935   HGB 12.8 09/12/2016 0941   HCT 38.8 07/14/2019 0935   HCT 39.1 09/12/2016 0941   PLT 131 (L) 07/14/2019 0935   PLT 94 (  LL) 09/12/2016 0941   MCV 98.5 07/14/2019 0935   MCV 93 09/12/2016 0941   MCH 31.7 07/14/2019 0935   MCHC 32.2 07/14/2019 0935   RDW 12.3 07/14/2019 0935   RDW 13.7 09/12/2016 0941   LYMPHSABS 0.5 (L) 04/06/2019 0940   LYMPHSABS 1.7 09/12/2016 0941    MONOABS 0.4 04/06/2019 0940   EOSABS 0.1 04/06/2019 0940   EOSABS 0.1 09/12/2016 0941   BASOSABS 0.0 04/06/2019 0940   BASOSABS 0.0 09/12/2016 0941      Chest Imaging: 01/01/2020 CT chest: Stable 67 mm groundglass opacity right lower lobe. The patient's images have been independently reviewed by me.    Pulmonary Functions Testing Results: No flowsheet data found.  FeNO:   Pathology:   Echocardiogram:   Heart Catheterization:     Assessment & Plan:     ICD-10-CM   1. Lung nodule  R91.1 CT Super D Chest Wo Contrast    2. S/P bilateral mastectomy  Z90.13     3. 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 Asian ancestry   Z13.9       Discussion:  This is a 49 year old female, Asian descent, history of LCIS of the left breast status post bilateral mastectomy.  She has a lung nodule within the right lower lobe approximately 7 mm in size groundglass opacity.  Asian female do have a high risk of development of lung cancer in non-smokers that are associated with higher mutation rates.  We discussed this today in the office.  I think that her GGO nodule needs to be followed closely.  Plan: We will have a repeat noncontrasted CT scan of the chest in May 2023. We discussed today the pros and cons of attempts at biopsies of subcentimeter nodules. I think even with the best of technology a 7 mm groundglass opacity is very difficult to access for adequate tissue sampling. If the lesion grows I think that we potentially could consider dye marking the lesion and wedge resection with thoracic surgery. She is open to this discussion in the future. I think we will start with conservative CT images surveillance. We will see her in May after her repeat CT scan of the chest.   Current Outpatient Medications:    betamethasone dipropionate (DIPROLENE) 0.05 % ointment, APPLY TOPICALLY 2 (TWO) TIMES DAILY FOR AFFECTED AREAS ON THE BODY. DO NOT APPLY TO FACE,  ARMPITS, OR GROIN, Disp: 45 g, Rfl: 1   bismuth-metronidazole-tetracycline (PYLERA) 140-125-125 MG capsule, Take 3 capsules by mouth 4 (four) times daily -  before meals and at bedtime., Disp: 120 capsule, Rfl: 0   clobetasol (TEMOVATE) 0.05 % external solution, APPLY TOPICALLY 2 (TWO) TIMES DAILY, Disp: 50 mL, Rfl: 1   clobetasol ointment (TEMOVATE) 0.05 %, APPLY TOPICALLY TO AFFECTED AREA TWICE DAILY. STOP WHEN SMOOTH. DO NOT APPLY TO FACE OR SKIN FOLDS., Disp: 60 g, Rfl: 3   cycloSPORINE, PF, (CEQUA) 0.09 % SOLN, Place 1 drop into both eyes in the morning and at bedtime. , Disp: , Rfl:    cycloSPORINE, PF, 0.09 % SOLN, Place 1 drop into both eyes 2 (two) times daily., Disp: 180 each, Rfl: 4   cycloSPORINE, PF, 0.09 % SOLN, APPLY 1 DROP TO EYE 2 TIMES DAILY, Disp: 60 each, Rfl: 5   gabapentin (NEURONTIN) 100 MG capsule, Take 2 capsules (200 mg total) by mouth at bedtime., Disp: 180 capsule, Rfl: 0   hydroxychloroquine (PLAQUENIL) 200 MG tablet, Take 200  mg by mouth every evening., Disp: , Rfl:    hydroxychloroquine (PLAQUENIL) 200 MG tablet, TAKE 2 TABLETS BY MOUTH EVERY OTHER DAY ALTERNATING WITH 1 TABLET EVERY OTHER DAY., Disp: 135 tablet, Rfl: 3   methocarbamol (ROBAXIN) 500 MG tablet, Take 1 tablet (500 mg total) by mouth 3 (three) times daily., Disp: 20 tablet, Rfl: 0   oxyCODONE (OXY IR/ROXICODONE) 5 MG immediate release tablet, Take 1 tablet (5 mg total) by mouth every 4 (four) hours as needed for moderate pain., Disp: 10 tablet, Rfl: 0   Polyethyl Glycol-Propyl Glycol (SYSTANE OP), Place 1 drop into both eyes every 4 (four) hours as needed (dry eyes)., Disp: , Rfl:    triamcinolone ointment (KENALOG) 0.1 %, APPLY 2 TIMES A DAY TO AFFECTED AREAS OF RASH ON BODY. STOP WHEN SMOOTH., Disp: 80 g, Rfl: 2   triamcinolone ointment (KENALOG) 0.5 %, APPLY TOPICALLY 2 (TWO) TIMES DAILY TO THE WORST AREAS OF ECZEMA ON THE BODY, NOT FOR FACE, STOP WHEN SMOOTH, Disp: 60 g, Rfl: 1   UNABLE TO FIND, Place 1  drop into both eyes 6 (six) times daily. Platelet rich growth factor eye drops, Disp: , Rfl:    Vitamin D, Ergocalciferol, 50000 units CAPS, 1 capsule, Disp: , Rfl:   I spent 63 minutes dedicated to the care of this patient on the date of this encounter to include pre-visit review of records, face-to-face time with the patient discussing conditions above, post visit ordering of testing, clinical documentation with the electronic health record, making appropriate referrals as documented, and communicating necessary findings to members of the patients care team.   Garner Nash, DO [facility] 10/17/2020 9:43 AM

## 2020-11-07 ENCOUNTER — Other Ambulatory Visit (HOSPITAL_COMMUNITY): Payer: Self-pay

## 2020-11-07 DIAGNOSIS — M359 Systemic involvement of connective tissue, unspecified: Secondary | ICD-10-CM | POA: Diagnosis not present

## 2020-11-07 DIAGNOSIS — R918 Other nonspecific abnormal finding of lung field: Secondary | ICD-10-CM | POA: Diagnosis not present

## 2020-11-07 DIAGNOSIS — M3219 Other organ or system involvement in systemic lupus erythematosus: Secondary | ICD-10-CM | POA: Diagnosis not present

## 2020-11-07 MED ORDER — HYDROXYCHLOROQUINE SULFATE 200 MG PO TABS
400.0000 mg | ORAL_TABLET | ORAL | 3 refills | Status: DC
  Filled 2020-11-07 – 2020-11-21 (×2): qty 135, 90d supply, fill #0
  Filled 2021-10-17: qty 135, 90d supply, fill #1

## 2020-11-09 ENCOUNTER — Other Ambulatory Visit (HOSPITAL_COMMUNITY): Payer: Self-pay

## 2020-11-17 ENCOUNTER — Other Ambulatory Visit (HOSPITAL_COMMUNITY): Payer: Self-pay

## 2020-11-21 ENCOUNTER — Ambulatory Visit (INDEPENDENT_AMBULATORY_CARE_PROVIDER_SITE_OTHER): Payer: 59 | Admitting: Family Medicine

## 2020-11-21 ENCOUNTER — Other Ambulatory Visit: Payer: Self-pay

## 2020-11-21 ENCOUNTER — Other Ambulatory Visit (HOSPITAL_COMMUNITY): Payer: Self-pay

## 2020-11-21 ENCOUNTER — Ambulatory Visit: Payer: 59 | Admitting: Family Medicine

## 2020-11-21 VITALS — BP 114/78 | HR 74 | Ht 63.0 in | Wt 113.0 lb

## 2020-11-21 DIAGNOSIS — M5136 Other intervertebral disc degeneration, lumbar region: Secondary | ICD-10-CM | POA: Diagnosis not present

## 2020-11-21 DIAGNOSIS — M25562 Pain in left knee: Secondary | ICD-10-CM | POA: Diagnosis not present

## 2020-11-21 DIAGNOSIS — G8929 Other chronic pain: Secondary | ICD-10-CM | POA: Diagnosis not present

## 2020-11-21 DIAGNOSIS — M5010 Cervical disc disorder with radiculopathy, unspecified cervical region: Secondary | ICD-10-CM

## 2020-11-21 MED FILL — Cyclosporine (Ophth) Soln 0.09% (PF): OPHTHALMIC | 90 days supply | Qty: 180 | Fill #2 | Status: AC

## 2020-11-21 NOTE — Assessment & Plan Note (Signed)
Left knee pain.  Once again likely more patellofemoral.  We discussed different treatment options.  Patient does have a Tru pull lite brace and we discussed potentially an OA stability brace with a Tru pull lite aspect could be more beneficial long-term.  Patient does have some mild instability it does feel like with anterior drawer.  X-rays only show some mild arthritic changes so optimistic that patient should do relatively well with conservative therapy.  Patient will follow-up after doing physical therapy and in 6 weeks if continuing to have pain consider possible injection

## 2020-11-21 NOTE — Assessment & Plan Note (Signed)
Known arthritic changes and overall seems to be doing okay.  Continues to have tightness though of the upper back as well.  Do believe that possibly formal physical therapy with the potential for dry needling would be helpful.  Patient will be referred today.  Has the muscle relaxer for breakthrough.  At follow-up could consider the possibility of trigger point injections if necessary in the upper thoracic spine.

## 2020-11-21 NOTE — Patient Instructions (Addendum)
Read on Visco supplementation Wear brace with more activity Ok to go up stairs try not to go down See you again in 4 weeks but if doing well can go 2 months

## 2020-11-21 NOTE — Assessment & Plan Note (Signed)
Stable overall.  Discussed continuing to monitor.  Follow-up again in 6 weeks

## 2020-11-21 NOTE — Progress Notes (Signed)
[place] Phone: [phone] Subjective:    I'm seeing this patient by the request  of:  Shon Baton, MD  CC: Left knee pain, back pain and neck pain  [id number]  10/11/2020 Patellofemoral likely more than anything else.  Patient gives history of potential instability of the knee from an ACL rupture many years ago.  We will see if there is any significant arthritic changes that would support this.  Discussed icing regimen and home exercises.  Follow-up again in 6 weeks worsening pain consider formal physical therapy or injection. 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  Discussed icing regimen and home exercises, discussed which activities to do which wants to avoid.  Increase activity slowly.  Follow-up again in 6 to 8 weeks otherwise. Known degenerative disc disease but patient is responding relatively well to the conservative therapy.  No longer having radicular symptoms but continues to have discomfort and pain.  Discussed with patient at great length today.  We discussed about multiple different problems including her knee as well as her lumbar spine.  Given her some exercises that I think will be beneficial and secondary to the history of the cancer I would like also imaging of these areas.  Depending on how patient responds we will discuss further.  Follow-up with me again in 6 to 8 weeks.  Updated Sarah [name] is a 50 y.o. female coming in with complaint of left knee, neck, and back pain. Knee only feels better when she wears the brace. She wears to the gym and work because of the stairs. Going to the gym more frequently to build up muscles mass that she lost, is also cycling at the gym. Wants to know if massage therapy or dry needling will help the pain.  Xray IMPRESSION: Mild degenerative changes without an acute osseous abnormality.     Past Medical History:  Diagnosis Date   ANA positive     ?Sjogrens   Breast neoplasm, Tis (LCIS), left    Cholecystitis    Cholelithiasis    Dry eye    Fibroid    Gall stones    GERD (gastroesophageal reflux disease)    Hypothyroidism    Takes synthroid   Infertility, female    Keratitis 2020   Lactose intolerance    Leukopenia    Sjogren's disease ([place])    Thrombocytopenia (HCC)    Urticaria    episodes since childhood    Vitamin D deficiency disease    Takes Vit D   Weight loss    Past Surgical History:  Procedure Laterality Date   CHOLECYSTECTOMY N/A 07/13/2014   Procedure: LAPAROSCOPIC CHOLECYSTECTOMY WITH INTRAOPERATIVE CHOLANGIOGRAM;  Surgeon: Rolm Bookbinder, MD;  Location: [facility];  Service: General;  Laterality: N/A;   egg retrieval  2008; 2011; 2012   multiple IVF cycles   ENDOMETRIAL BIOPSY     SIMPLE MASTECTOMY WITH AXILLARY SENTINEL NODE BIOPSY Bilateral 07/15/2019   Procedure: RIGHT RISK REDUCING MASTECTOMY AND LEFT MASTECTOMY  WITH LEFT AXILLARY SENTINEL NODE BIOPSY;  Surgeon: Rolm Bookbinder, MD;  Location: [facility];  Service: General;  Laterality: Bilateral;  PEC BLOCK   WISDOM TOOTH EXTRACTION     Social History   Socioeconomic History   Marital status: Married    Spouse name: Not on file   Number of children: 0   Years of education: Not on file   Highest education level: Not on file  Occupational History  Occupation: internal medicine physician/ hospitalist    Employer: [employer]  Tobacco Use   Smoking status: Never   Smokeless tobacco: Never  Vaping Use   Vaping Use: Never used  Substance and Sexual Activity   Alcohol use: No    Alcohol/week: 0.0 standard drinks   Drug use: No   Sexual activity: Yes    Partners: Male    Birth control/protection: Other-see comments    Comment: infertility  Other Topics Concern   Not on file  Social History Narrative   Married   Social Determinants of Health   Financial Resource Strain: Not on file  Food Insecurity: Not on file  Transportation  Needs: Not on file  Physical Activity: Not on file  Stress: Not on file  Social Connections: Not on file   Allergies  Allergen Reactions   Lactose Intolerance (Gi) Diarrhea    Stomach pain   Lifitegrast Other (See Comments)    corneal lesion   Latex Rash   Tape Rash   Family History  Problem Relation Age of Onset   Lupus Sister    Thyroid disease Sister    Thyroid disease Mother        Current Outpatient Medications (Analgesics):    oxyCODONE (OXY IR/ROXICODONE) 5 MG immediate release tablet, Take 1 tablet (5 mg total) by mouth every 4 (four) hours as needed for moderate pain.   Current Outpatient Medications (Other):    betamethasone dipropionate (DIPROLENE) 0.05 % ointment, APPLY TOPICALLY 2 (TWO) TIMES DAILY FOR AFFECTED AREAS ON THE BODY. DO NOT APPLY TO FACE, ARMPITS, OR GROIN   bismuth-metronidazole-tetracycline (PYLERA) 140-125-125 MG capsule, Take 3 capsules by mouth 4 (four) times daily -  before meals and at bedtime.   clobetasol (TEMOVATE) 0.05 % external solution, APPLY TOPICALLY 2 (TWO) TIMES DAILY   clobetasol ointment (TEMOVATE) 0.05 %, APPLY TOPICALLY TO AFFECTED AREA TWICE DAILY. STOP WHEN SMOOTH. DO NOT APPLY TO FACE OR SKIN FOLDS.   cycloSPORINE, PF, (CEQUA) 0.09 % SOLN, Place 1 drop into both eyes in the morning and at bedtime.    cycloSPORINE, PF, 0.09 % SOLN, Place 1 drop into both eyes 2 (two) times daily.   cycloSPORINE, PF, 0.09 % SOLN, APPLY 1 DROP TO EYE 2 TIMES DAILY   gabapentin (NEURONTIN) 100 MG capsule, Take 2 capsules (200 mg total) by mouth at bedtime.   hydroxychloroquine (PLAQUENIL) 200 MG tablet, Take 200 mg by mouth every evening.   hydroxychloroquine (PLAQUENIL) 200 MG tablet, TAKE 2 TABLETS BY MOUTH EVERY OTHER DAY ALTERNATING WITH 1 TABLET EVERY OTHER DAY.   hydroxychloroquine (PLAQUENIL) 200 MG tablet, TAKE 2 TABLETS BY MOUTH EVERY OTHER DAY ALTERNATING WITH 1 TABLET EVERY OTHER DAY.   methocarbamol (ROBAXIN) 500 MG tablet, Take 1  tablet (500 mg total) by mouth 3 (three) times daily.   Polyethyl Glycol-Propyl Glycol (SYSTANE OP), Place 1 drop into both eyes every 4 (four) hours as needed (dry eyes).   triamcinolone ointment (KENALOG) 0.1 %, APPLY 2 TIMES A DAY TO AFFECTED AREAS OF RASH ON BODY. STOP WHEN SMOOTH.   triamcinolone ointment (KENALOG) 0.5 %, APPLY TOPICALLY 2 (TWO) TIMES DAILY TO THE WORST AREAS OF ECZEMA ON THE BODY, NOT FOR FACE, STOP WHEN SMOOTH   UNABLE TO FIND, Place 1 drop into both eyes 6 (six) times daily. Platelet rich growth factor eye drops   Vitamin D, Ergocalciferol, 50000 units CAPS, 1 capsule    Review of Systems:  No headache, visual changes, nausea, vomiting,  diarrhea, constipation, dizziness, abdominal pain, skin rash, fevers, chills, night sweats, weight loss, swollen lymph nodes, body aches, joint swelling, chest pain, shortness of breath, mood changes. POSITIVE muscle aches  Objective  Blood pressure 114/78, pulse 74, height 5\' 3"  (1.6 m), weight 113 lb (51.3 kg), SpO2 99 %.   General: No apparent distress alert and oriented x3 mood and affect normal, dressed appropriately.  HEENT: Pupils equal, extraocular movements intact  Respiratory: Patient's speak in full sentences and does not appear short of breath  Cardiovascular: No lower extremity edema, non tender, no erythema  Gait normal with good balance and coordination.  MSK: Left knee exam shows the patient does have some lateral tracking of the patella noted.  Patient does have some mild crepitus.  Patient has some laxity with anterior drawer compared to the contralateral side.  No significant instability with valgus and varus force     Impression and Recommendations:     The above documentation has been reviewed and is accurate and complete Lyndal Pulley, DO

## 2020-11-22 ENCOUNTER — Other Ambulatory Visit (HOSPITAL_COMMUNITY): Payer: Self-pay

## 2020-11-22 DIAGNOSIS — L568 Other specified acute skin changes due to ultraviolet radiation: Secondary | ICD-10-CM | POA: Diagnosis not present

## 2020-11-22 DIAGNOSIS — L308 Other specified dermatitis: Secondary | ICD-10-CM | POA: Diagnosis not present

## 2020-11-22 DIAGNOSIS — L814 Other melanin hyperpigmentation: Secondary | ICD-10-CM | POA: Diagnosis not present

## 2020-11-22 DIAGNOSIS — L821 Other seborrheic keratosis: Secondary | ICD-10-CM | POA: Diagnosis not present

## 2020-11-22 MED ORDER — CLOBETASOL PROPIONATE 0.05 % EX SOLN
Freq: Two times a day (BID) | CUTANEOUS | 1 refills | Status: DC
  Filled 2020-11-22: qty 50, 20d supply, fill #0

## 2020-12-02 ENCOUNTER — Other Ambulatory Visit (HOSPITAL_COMMUNITY): Payer: Self-pay

## 2020-12-27 DIAGNOSIS — Z853 Personal history of malignant neoplasm of breast: Secondary | ICD-10-CM | POA: Diagnosis not present

## 2020-12-27 DIAGNOSIS — R911 Solitary pulmonary nodule: Secondary | ICD-10-CM | POA: Diagnosis not present

## 2020-12-29 NOTE — Progress Notes (Signed)
[facility] [place] Phone: [phone] Subjective:   Sarah [name], am serving as a scribe for Dr. Hulan Saas.  This visit occurred during the SARS-CoV-2 public health emergency.  Safety protocols were in place, including screening questions prior to the visit, additional usage of staff PPE, and extensive cleaning of exam room while observing appropriate contact time as indicated for disinfecting solutions.    I'm seeing this patient by the request  of:  Shon Baton, MD  CC:   [id number]  11/21/2020 Known arthritic changes and overall seems to be doing okay.  Continues to have tightness though of the upper back as well.  Do believe that possibly formal physical therapy with the potential for dry needling would be helpful.  Patient will be referred today.  Has the muscle relaxer for breakthrough.  At follow-up could consider the possibility of trigger point injections if necessary in the upper thoracic spine.  Left knee pain.  Once again likely more patellofemoral.  We discussed different treatment options.  Patient does have a Tru pull lite brace and we discussed potentially an OA stability brace with a Tru pull lite aspect could be more beneficial long-term.  Patient does have some mild instability it does feel like with anterior drawer.  X-rays only show some mild arthritic changes so optimistic that patient should do relatively well with conservative therapy.  Patient will follow-up after doing physical therapy and in 6 weeks if continuing to have pain consider possible injection  Update 12/30/2020 Sarah [name] is a 50 y.o. female coming in with complaint of L knee and neck pain. States that neck pain is much better. Still has pain in traps bilaterally.  Patient does state that that it does seem to be improved when she is doing the exercises and working on her posture.  Knee pain has also approved as she is using elevator vs  stair climbing. Also started cycling which is not painful.  Patient states as long as she avoids going down the stairs seems to do very well.       Past Medical History:  Diagnosis Date   ANA positive    ?Sjogrens   Breast neoplasm, Tis (LCIS), left    Cholecystitis    Cholelithiasis    Dry eye    Fibroid    Gall stones    GERD (gastroesophageal reflux disease)    Hypothyroidism    Takes synthroid   Infertility, female    Keratitis 2020   Lactose intolerance    Leukopenia    Sjogren's disease ([place])    Thrombocytopenia ([place])    Urticaria    episodes since childhood    Vitamin D deficiency disease    Takes Vit D   Weight loss    Past Surgical History:  Procedure Laterality Date   CHOLECYSTECTOMY N/A 07/13/2014   Procedure: LAPAROSCOPIC CHOLECYSTECTOMY WITH INTRAOPERATIVE CHOLANGIOGRAM;  Surgeon: Rolm Bookbinder, MD;  Location: [facility];  Service: General;  Laterality: N/A;   egg retrieval  2008; 2011; 2012   multiple IVF cycles   ENDOMETRIAL BIOPSY     SIMPLE MASTECTOMY WITH AXILLARY SENTINEL NODE BIOPSY Bilateral 07/15/2019   Procedure: RIGHT RISK REDUCING MASTECTOMY AND LEFT MASTECTOMY  WITH LEFT AXILLARY SENTINEL NODE BIOPSY;  Surgeon: Rolm Bookbinder, MD;  Location: [facility];  Service: General;  Laterality: Bilateral;  PEC BLOCK   WISDOM TOOTH EXTRACTION     Social History   Socioeconomic History  Marital status: Married    Spouse name: Not on file   Number of children: 0   Years of education: Not on file   Highest education level: Not on file  Occupational History   Occupation: internal medicine physician/ hospitalist    Employer: [employer]  Tobacco Use   Smoking status: Never   Smokeless tobacco: Never  Vaping Use   Vaping Use: Never used  Substance and Sexual Activity   Alcohol use: [name]    Alcohol/week: 0.0 standard drinks   Drug use: [name]   Sexual activity: Yes    Partners: Male    Birth control/protection: Other-see comments    Comment:  infertility  Other Topics Concern   Not on file  Social History Narrative   Married   Social Determinants of Health   Financial Resource Strain: Not on file  Food Insecurity: Not on file  Transportation Needs: Not on file  Physical Activity: Not on file  Stress: Not on file  Social Connections: Not on file   Allergies  Allergen Reactions   Lactose Intolerance (Gi) Diarrhea    Stomach pain   Lifitegrast Other (See Comments)    corneal lesion   Latex Rash   Tape Rash   Family History  Problem Relation Age of Onset   Lupus Sister    Thyroid disease Sister    Thyroid disease Mother        Current Outpatient Medications (Analgesics):    oxyCODONE (OXY IR/ROXICODONE) 5 MG immediate release tablet, Take 1 tablet (5 mg total) by mouth every 4 (four) hours as needed for moderate pain.   Current Outpatient Medications (Other):    betamethasone dipropionate (DIPROLENE) 0.05 % ointment, APPLY TOPICALLY 2 (TWO) TIMES DAILY FOR AFFECTED AREAS ON THE BODY. DO NOT APPLY TO FACE, ARMPITS, OR GROIN   bismuth-metronidazole-tetracycline (PYLERA) 140-125-125 MG capsule, Take 3 capsules by mouth 4 (four) times daily -  before meals and at bedtime.   clobetasol (TEMOVATE) 0.05 % external solution, APPLY TOPICALLY 2 (TWO) TIMES DAILY   clobetasol (TEMOVATE) 0.05 % external solution, Apply topically 2 (two) times daily   cycloSPORINE, PF, (CEQUA) 0.09 % SOLN, Place 1 drop into both eyes in the morning and at bedtime.    cycloSPORINE, PF, 0.09 % SOLN, Place 1 drop into both eyes 2 (two) times daily.   gabapentin (NEURONTIN) 100 MG capsule, Take 2 capsules (200 mg total) by mouth at bedtime.   hydroxychloroquine (PLAQUENIL) 200 MG tablet, Take 200 mg by mouth every evening.   hydroxychloroquine (PLAQUENIL) 200 MG tablet, TAKE 2 TABLETS BY MOUTH EVERY OTHER DAY ALTERNATING WITH 1 TABLET EVERY OTHER DAY.   hydroxychloroquine (PLAQUENIL) 200 MG tablet, TAKE 2 TABLETS BY MOUTH EVERY OTHER DAY  ALTERNATING WITH 1 TABLET EVERY OTHER DAY.   methocarbamol (ROBAXIN) 500 MG tablet, Take 1 tablet (500 mg total) by mouth 3 (three) times daily.   Polyethyl Glycol-Propyl Glycol (SYSTANE OP), Place 1 drop into both eyes every 4 (four) hours as needed (dry eyes).   triamcinolone ointment (KENALOG) 0.1 %, APPLY 2 TIMES A DAY TO AFFECTED AREAS OF RASH ON BODY. STOP WHEN SMOOTH.   triamcinolone ointment (KENALOG) 0.5 %, APPLY TOPICALLY 2 (TWO) TIMES DAILY TO THE WORST AREAS OF ECZEMA ON THE BODY, NOT FOR FACE, STOP WHEN SMOOTH   UNABLE TO FIND, Place 1 drop into both eyes 6 (six) times daily. Platelet rich growth factor eye drops   Vitamin D, Ergocalciferol, 50000 units CAPS, 1 capsule  Review of Systems:  [name] headache, visual changes, nausea, vomiting, diarrhea, constipation, dizziness, abdominal pain, skin rash, fevers, chills, night sweats, weight loss, swollen lymph nodes, body aches, joint swelling, chest pain, shortness of breath, mood changes. POSITIVE muscle aches  Objective  Blood pressure 112/82, pulse 71, height 5\' 3"  (1.6 m), weight 111 lb (50.3 kg), SpO2 99 %.   General: [name] apparent distress alert and oriented x3 mood and affect normal, dressed appropriately.  HEENT: Pupils equal, extraocular movements intact  Respiratory: Patient's speak in full sentences and does not appear short of breath  Cardiovascular: [name] lower extremity edema, non tender, [name] erythema  Gait relatively normal Neck exam does have some loss of lordosis.  Patient does have some slight weakness noted of the scapular region.  Patient is tight in the trapezius.  Seems to have full strength [name] noted Right knee exam still with some mild crepitus noted with range of motion.  Patient is nontender on exam today.   Impression and Recommendations:     The above documentation has been reviewed and is accurate and complete Lyndal Pulley, DO

## 2020-12-30 ENCOUNTER — Ambulatory Visit (INDEPENDENT_AMBULATORY_CARE_PROVIDER_SITE_OTHER): Payer: 59 | Admitting: Family Medicine

## 2020-12-30 ENCOUNTER — Encounter: Payer: Self-pay | Admitting: Family Medicine

## 2020-12-30 ENCOUNTER — Other Ambulatory Visit: Payer: Self-pay

## 2020-12-30 DIAGNOSIS — M5010 Cervical disc disorder with radiculopathy, unspecified cervical region: Secondary | ICD-10-CM

## 2020-12-30 DIAGNOSIS — M25562 Pain in left knee: Secondary | ICD-10-CM

## 2020-12-30 DIAGNOSIS — G8929 Other chronic pain: Secondary | ICD-10-CM

## 2020-12-30 NOTE — Assessment & Plan Note (Signed)
Patient has made some improvement with just the home exercises at this time.  Discussed with patient to continue them at the moment.  Discussed ergonomics and other postural exercises that I think will be helpful.  Patient does not need the trigger points as we discussed previously.  Discussed avoiding certain activities but otherwise patient can follow-up in 2 months if not completely resolved.

## 2020-12-30 NOTE — Patient Instructions (Signed)
Good to see you Glad you are doing well Look at gel injection info Have appt in 2 months just in case

## 2020-12-30 NOTE — Assessment & Plan Note (Signed)
Still seems to be more patellofemoral.  We can continue to monitor at this time.  Patient is making improvement.  No change in management we will follow-up again in 2 months.

## 2021-01-09 DIAGNOSIS — M3500 Sicca syndrome, unspecified: Secondary | ICD-10-CM | POA: Diagnosis not present

## 2021-01-09 DIAGNOSIS — H04123 Dry eye syndrome of bilateral lacrimal glands: Secondary | ICD-10-CM | POA: Diagnosis not present

## 2021-01-09 DIAGNOSIS — H16223 Keratoconjunctivitis sicca, not specified as Sjogren's, bilateral: Secondary | ICD-10-CM | POA: Diagnosis not present

## 2021-01-09 DIAGNOSIS — Z79899 Other long term (current) drug therapy: Secondary | ICD-10-CM | POA: Diagnosis not present

## 2021-02-22 DIAGNOSIS — H04129 Dry eye syndrome of unspecified lacrimal gland: Secondary | ICD-10-CM | POA: Diagnosis not present

## 2021-02-22 DIAGNOSIS — Z79899 Other long term (current) drug therapy: Secondary | ICD-10-CM | POA: Diagnosis not present

## 2021-02-22 DIAGNOSIS — H04123 Dry eye syndrome of bilateral lacrimal glands: Secondary | ICD-10-CM | POA: Diagnosis not present

## 2021-02-22 DIAGNOSIS — H5319 Other subjective visual disturbances: Secondary | ICD-10-CM | POA: Diagnosis not present

## 2021-02-27 DIAGNOSIS — Z79899 Other long term (current) drug therapy: Secondary | ICD-10-CM | POA: Diagnosis not present

## 2021-02-27 DIAGNOSIS — M3500 Sicca syndrome, unspecified: Secondary | ICD-10-CM | POA: Diagnosis not present

## 2021-02-27 DIAGNOSIS — R21 Rash and other nonspecific skin eruption: Secondary | ICD-10-CM | POA: Diagnosis not present

## 2021-02-27 DIAGNOSIS — H169 Unspecified keratitis: Secondary | ICD-10-CM | POA: Diagnosis not present

## 2021-02-27 DIAGNOSIS — Z681 Body mass index (BMI) 19 or less, adult: Secondary | ICD-10-CM | POA: Diagnosis not present

## 2021-02-27 DIAGNOSIS — R5383 Other fatigue: Secondary | ICD-10-CM | POA: Diagnosis not present

## 2021-03-02 NOTE — Progress Notes (Signed)
[place] Phone: [phone] Subjective:    I'm seeing this patient by the request  of:  Shon Baton, MD  CC: knee pain follow up, neck pain follow up   [id number]  12/30/2020 Still seems to be more patellofemoral.  We can continue to monitor at this time.  Patient is making improvement.  No change in management we will follow-up again in 2 months.  Patient has made some improvement with just the home exercises at this time.  Discussed with patient to continue them at the moment.  Discussed ergonomics and other postural exercises that I think will be helpful.  Patient does not need the trigger points as we discussed previously.  Discussed avoiding certain activities but otherwise patient can follow-up in 2 months if not completely resolved.  Update 03/07/2021 Sarah [name] is a 51 y.o. female coming in with complaint of neck and L knee pain. Patient states that her left knee is doing better since she isn't using stairs , has a brace she would like to get looked at. R hip is hurting more than L thinks its compensation , shoulders are hurting, has a new problem on the R lateral knee sharp pain noticed it when she was walking her dog really hurt her on Friday 03/03/2021 it comes and goes for the last 1-2 months . Started cycling and thinks that helped her significantly   Xray of knee mild OA Mild DDD of cervical spine     Past Medical History:  Diagnosis Date   ANA positive    ?Sjogrens   Breast neoplasm, Tis (LCIS), left    Cholecystitis    Cholelithiasis    Dry eye    Fibroid    Gall stones    GERD (gastroesophageal reflux disease)    Hypothyroidism    Takes synthroid   Infertility, female    Keratitis 2020   Lactose intolerance    Leukopenia    Sjogren's disease ([place])    Thrombocytopenia ([facility])    Urticaria    episodes since childhood    Vitamin D deficiency disease    Takes Vit D   Weight loss    Past  Surgical History:  Procedure Laterality Date   CHOLECYSTECTOMY N/A 07/13/2014   Procedure: LAPAROSCOPIC CHOLECYSTECTOMY WITH INTRAOPERATIVE CHOLANGIOGRAM;  Surgeon: Rolm Bookbinder, MD;  Location: [facility];  Service: General;  Laterality: N/A;   egg retrieval  2008; 2011; 2012   multiple IVF cycles   ENDOMETRIAL BIOPSY     SIMPLE MASTECTOMY WITH AXILLARY SENTINEL NODE BIOPSY Bilateral 07/15/2019   Procedure: RIGHT RISK REDUCING MASTECTOMY AND LEFT MASTECTOMY  WITH LEFT AXILLARY SENTINEL NODE BIOPSY;  Surgeon: Rolm Bookbinder, MD;  Location: [facility];  Service: General;  Laterality: Bilateral;  PEC BLOCK   WISDOM TOOTH EXTRACTION     Social History   Socioeconomic History   Marital status: Married    Spouse name: Not on file   Number of children: 0   Years of education: Not on file   Highest education level: Not on file  Occupational History   Occupation: internal medicine physician/ hospitalist    Employer: [employer]  Tobacco Use   Smoking status: Never   Smokeless tobacco: Never  Vaping Use   Vaping Use: Never used  Substance and Sexual Activity   Alcohol use: No    Alcohol/week: 0.0 standard drinks   Drug use: No   Sexual activity: Yes  Partners: Male    Birth control/protection: Other-see comments    Comment: infertility  Other Topics Concern   Not on file  Social History Narrative   Married   Social Determinants of Health   Financial Resource Strain: Not on file  Food Insecurity: Not on file  Transportation Needs: Not on file  Physical Activity: Not on file  Stress: Not on file  Social Connections: Not on file   Allergies  Allergen Reactions   Lactose Intolerance (Gi) Diarrhea    Stomach pain   Lifitegrast Other (See Comments)    corneal lesion   Latex Rash   Tape Rash   Family History  Problem Relation Age of Onset   Lupus Sister    Thyroid disease Sister    Thyroid disease Mother        Current Outpatient Medications (Analgesics):     oxyCODONE (OXY IR/ROXICODONE) 5 MG immediate release tablet, Take 1 tablet (5 mg total) by mouth every 4 (four) hours as needed for moderate pain.   Current Outpatient Medications (Other):    bismuth-metronidazole-tetracycline (PYLERA) 140-125-125 MG capsule, Take 3 capsules by mouth 4 (four) times daily -  before meals and at bedtime.   clobetasol (TEMOVATE) 0.05 % external solution, Apply topically 2 (two) times daily   cycloSPORINE, PF, (CEQUA) 0.09 % SOLN, Place 1 drop into both eyes in the morning and at bedtime.    cycloSPORINE, PF, 0.09 % SOLN, Place 1 drop into both eyes 2 (two) times daily.   gabapentin (NEURONTIN) 100 MG capsule, Take 2 capsules (200 mg total) by mouth at bedtime.   hydroxychloroquine (PLAQUENIL) 200 MG tablet, Take 200 mg by mouth every evening.   hydroxychloroquine (PLAQUENIL) 200 MG tablet, TAKE 2 TABLETS BY MOUTH EVERY OTHER DAY ALTERNATING WITH 1 TABLET EVERY OTHER DAY.   methocarbamol (ROBAXIN) 500 MG tablet, Take 1 tablet (500 mg total) by mouth 3 (three) times daily.   Polyethyl Glycol-Propyl Glycol (SYSTANE OP), Place 1 drop into both eyes every 4 (four) hours as needed (dry eyes).   UNABLE TO FIND, Place 1 drop into both eyes 6 (six) times daily. Platelet rich growth factor eye drops   Vitamin D, Ergocalciferol, 50000 units CAPS, 1 capsule   Reviewed prior external information including notes and imaging from  primary care provider As well as notes that were available from care everywhere and other healthcare systems.  Past medical history, social, surgical and family history all reviewed in electronic medical record.  No pertanent information unless stated regarding to the chief complaint.   Review of Systems:  No headache, visual changes, nausea, vomiting, diarrhea, constipation, dizziness, abdominal pain, skin rash, fevers, chills, night sweats, weight loss, swollen lymph nodes, joint swelling, chest pain, shortness of breath, mood changes. POSITIVE  muscle aches, body aches   Objective  Blood pressure 110/60, pulse 79, height 5\' 3"  (1.6 m), weight 111 lb (50.3 kg), SpO2 97 %.   General: No apparent distress alert and oriented x3 mood and affect normal, dressed appropriately.  HEENT: Pupils equal, extraocular movements intact  Respiratory: Patient's speak in full sentences and does not appear short of breath  Cardiovascular: No lower extremity edema, non tender, no erythema  Gait very mildly antalgic. There are some tenderness to palpation over the medial and soft.  And seems to be regular than left.  Lateral tracking noted of the patella.  Patient does have some loss of lordosis of the lumbar spine.  Mild weakness of the hips bilaterally.  Impression and Recommendations:     The above documentation has been reviewed and is accurate and complete Lyndal Pulley, DO

## 2021-03-07 ENCOUNTER — Other Ambulatory Visit: Payer: Self-pay

## 2021-03-07 ENCOUNTER — Ambulatory Visit (INDEPENDENT_AMBULATORY_CARE_PROVIDER_SITE_OTHER): Payer: 59 | Admitting: Family Medicine

## 2021-03-07 ENCOUNTER — Ambulatory Visit (INDEPENDENT_AMBULATORY_CARE_PROVIDER_SITE_OTHER): Payer: 59

## 2021-03-07 VITALS — BP 110/60 | HR 79 | Ht 63.0 in | Wt 111.0 lb

## 2021-03-07 DIAGNOSIS — M25561 Pain in right knee: Secondary | ICD-10-CM

## 2021-03-07 DIAGNOSIS — M25559 Pain in unspecified hip: Secondary | ICD-10-CM

## 2021-03-07 DIAGNOSIS — M25562 Pain in left knee: Secondary | ICD-10-CM | POA: Diagnosis not present

## 2021-03-07 DIAGNOSIS — M25552 Pain in left hip: Secondary | ICD-10-CM | POA: Diagnosis not present

## 2021-03-07 DIAGNOSIS — R102 Pelvic and perineal pain: Secondary | ICD-10-CM | POA: Diagnosis not present

## 2021-03-07 NOTE — Patient Instructions (Addendum)
Good to see you  Same thing as your other knee have to keep the knee cap from going outside  Keep taking vitamins  Core HEP  HA gel injection for next time  X-rays on the way out  4-6 week follow up

## 2021-03-07 NOTE — Assessment & Plan Note (Addendum)
Appears to be potentially acute on chronic.  Patient overall does not have any significant swelling at the moment.  Discussed icing regimen and home exercises.  Discussed which activities during which ones to avoid.  Increase activity slowly.  Likely more of a patellofemoral.  Patient does have a brace noted.  Worsening pain can consider formal physical therapy or injections.  Patient wants to see how she does with conservative therapy and follow-up again in 2 to 3 months

## 2021-04-03 DIAGNOSIS — R7989 Other specified abnormal findings of blood chemistry: Secondary | ICD-10-CM | POA: Diagnosis not present

## 2021-04-03 DIAGNOSIS — M3500 Sicca syndrome, unspecified: Secondary | ICD-10-CM | POA: Diagnosis not present

## 2021-04-03 DIAGNOSIS — D72819 Decreased white blood cell count, unspecified: Secondary | ICD-10-CM | POA: Diagnosis not present

## 2021-04-10 ENCOUNTER — Other Ambulatory Visit: Payer: 59

## 2021-04-10 NOTE — Progress Notes (Unsigned)
Sarah [name] Phone: [phone] Subjective:    I'm seeing this patient by the request  of:  Sarah Baton, MD  CC:   [id number]  03/07/2021 Appears to be potentially acute on chronic.  Patient overall does not have any significant swelling at the moment.  Discussed icing regimen and home exercises.  Discussed which activities during which ones to avoid.  Increase activity slowly.  Likely more of a patellofemoral.  Patient does have a brace noted.  Worsening pain can consider formal physical therapy or injections.  Patient wants to see how she does with conservative therapy and follow-up again in 2 to 3 months  Updated 04/11/2021 Sarah [name] is a 51 y.o. female coming in with complaint of right knee pain  XrayIMPRESSION: Possible mild medial compartment and patellofemoral compartment joint space narrowing degenerative change.  IMPRESSION: Minimal pubic symphysis osteoarthritis.     Past Medical History:  Diagnosis Date   ANA positive    ?Sjogrens   Breast neoplasm, Tis (LCIS), left    Cholecystitis    Cholelithiasis    Dry eye    Fibroid    Gall stones    GERD (gastroesophageal reflux disease)    Hypothyroidism    Takes synthroid   Infertility, female    Keratitis 2020   Lactose intolerance    Leukopenia    Sjogren's disease ([facility])    Thrombocytopenia ([facility])    Urticaria    episodes since childhood    Vitamin D deficiency disease    Takes Vit D   Weight loss    Past Surgical History:  Procedure Laterality Date   CHOLECYSTECTOMY N/A 07/13/2014   Procedure: LAPAROSCOPIC CHOLECYSTECTOMY WITH INTRAOPERATIVE CHOLANGIOGRAM;  Surgeon: Sarah Bookbinder, MD;  Location: [facility];  Service: General;  Laterality: N/A;   egg retrieval  2008; 2011; 2012   multiple IVF cycles   ENDOMETRIAL BIOPSY     SIMPLE MASTECTOMY WITH AXILLARY SENTINEL NODE BIOPSY Bilateral 07/15/2019   Procedure: RIGHT RISK REDUCING MASTECTOMY  AND LEFT MASTECTOMY  WITH LEFT AXILLARY SENTINEL NODE BIOPSY;  Surgeon: Sarah Bookbinder, MD;  Location: [facility];  Service: General;  Laterality: Bilateral;  PEC BLOCK   WISDOM TOOTH EXTRACTION     Social History   Socioeconomic History   Marital status: Married    Spouse name: Not on file   Number of children: 0   Years of education: Not on file   Highest education level: Not on file  Occupational History   Occupation: internal medicine physician/ hospitalist    Employer: [employer]  Tobacco Use   Smoking status: Never   Smokeless tobacco: Never  Vaping Use   Vaping Use: Never used  Substance and Sexual Activity   Alcohol use: No    Alcohol/week: 0.0 standard drinks   Drug use: No   Sexual activity: Yes    Partners: Male    Birth control/protection: Other-see comments    Comment: infertility  Other Topics Concern   Not on file  Social History Narrative   Married   Social Determinants of Health   Financial Resource Strain: Not on file  Food Insecurity: Not on file  Transportation Needs: Not on file  Physical Activity: Not on file  Stress: Not on file  Social Connections: Not on file   Allergies  Allergen Reactions   Lactose Intolerance (Gi) Diarrhea    Stomach pain   Lifitegrast Other (See Comments)    corneal  lesion   Latex Rash   Tape Rash   Family History  Problem Relation Age of Onset   Lupus Sister    Thyroid disease Sister    Thyroid disease Mother        Current Outpatient Medications (Analgesics):    oxyCODONE (OXY IR/ROXICODONE) 5 MG immediate release tablet, Take 1 tablet (5 mg total) by mouth every 4 (four) hours as needed for moderate pain.   Current Outpatient Medications (Other):    bismuth-metronidazole-tetracycline (PYLERA) 140-125-125 MG capsule, Take 3 capsules by mouth 4 (four) times daily -  before meals and at bedtime.   clobetasol (TEMOVATE) 0.05 % external solution, Apply topically 2 (two) times daily   cycloSPORINE, PF,  (CEQUA) 0.09 % SOLN, Place 1 drop into both eyes in the morning and at bedtime.    cycloSPORINE, PF, 0.09 % SOLN, Place 1 drop into both eyes 2 (two) times daily.   gabapentin (NEURONTIN) 100 MG capsule, Take 2 capsules (200 mg total) by mouth at bedtime.   hydroxychloroquine (PLAQUENIL) 200 MG tablet, Take 200 mg by mouth every evening.   hydroxychloroquine (PLAQUENIL) 200 MG tablet, TAKE 2 TABLETS BY MOUTH EVERY OTHER DAY ALTERNATING WITH 1 TABLET EVERY OTHER DAY.   methocarbamol (ROBAXIN) 500 MG tablet, Take 1 tablet (500 mg total) by mouth 3 (three) times daily.   Polyethyl Glycol-Propyl Glycol (SYSTANE OP), Place 1 drop into both eyes every 4 (four) hours as needed (dry eyes).   UNABLE TO FIND, Place 1 drop into both eyes 6 (six) times daily. Platelet rich growth factor eye drops   Vitamin D, Ergocalciferol, 50000 units CAPS, 1 capsule   Reviewed prior external information including notes and imaging from  primary care provider As well as notes that were available from care everywhere and other healthcare systems.  Past medical history, social, surgical and family history all reviewed in electronic medical record.  No pertanent information unless stated regarding to the chief complaint.   Review of Systems:  No headache, visual changes, nausea, vomiting, diarrhea, constipation, dizziness, abdominal pain, skin rash, fevers, chills, night sweats, weight loss, swollen lymph nodes, body aches, joint swelling, chest pain, shortness of breath, mood changes. POSITIVE muscle aches  Objective  There were no vitals taken for this visit.   General: No apparent distress alert and oriented x3 mood and affect normal, dressed appropriately.  HEENT: Pupils equal, extraocular movements intact  Respiratory: Patient's speak in full sentences and does not appear short of breath  Cardiovascular: No lower extremity edema, non tender, no erythema  Gait normal with good balance and coordination.  MSK:  Non  tender with full range of motion and good stability and symmetric strength and tone of shoulders, elbows, wrist, hip, knee and ankles bilaterally.     Impression and Recommendations:     The above documentation has been reviewed and is accurate and complete Sarah [name]

## 2021-04-11 ENCOUNTER — Ambulatory Visit (INDEPENDENT_AMBULATORY_CARE_PROVIDER_SITE_OTHER): Payer: 59 | Admitting: Family Medicine

## 2021-04-11 ENCOUNTER — Other Ambulatory Visit: Payer: Self-pay

## 2021-04-11 DIAGNOSIS — M25561 Pain in right knee: Secondary | ICD-10-CM | POA: Diagnosis not present

## 2021-04-11 DIAGNOSIS — M5136 Other intervertebral disc degeneration, lumbar region: Secondary | ICD-10-CM

## 2021-04-11 NOTE — Assessment & Plan Note (Signed)
Patient has had difficulty with the knees previously and did have the approval for the viscosupplementation.  Patient wants to hold at this point though with her doing relatively better with her avoiding certain activities such as the stairs.  Patient can follow-up with me then.  If any worsening pain we can discuss again about repeating steroid injections or the possibility of viscosupplementation. ?

## 2021-04-11 NOTE — Patient Instructions (Addendum)
Good to see you  ?So glad you are doing better ?Keep doing everything you are doing  ?Follow up in 8 weeks  ?

## 2021-04-11 NOTE — Assessment & Plan Note (Signed)
Known arthritic changes of the back and the neck.  Discussed the possibility of osteopathic manipulation but patient is doing relatively well at the moment.  Patient is making some progress.  We discussed continuing the different medications if she needed them.  No longer taking the gabapentin.  We discussed the vitamin D as well.  Patient wants to continue with this and may follow-up again in 3 months otherwise. ?

## 2021-05-09 DIAGNOSIS — H53122 Transient visual loss, left eye: Secondary | ICD-10-CM | POA: Insufficient documentation

## 2021-05-09 DIAGNOSIS — H169 Unspecified keratitis: Secondary | ICD-10-CM | POA: Insufficient documentation

## 2021-05-09 DIAGNOSIS — Z853 Personal history of malignant neoplasm of breast: Secondary | ICD-10-CM | POA: Diagnosis not present

## 2021-05-09 DIAGNOSIS — Z79899 Other long term (current) drug therapy: Secondary | ICD-10-CM | POA: Insufficient documentation

## 2021-05-09 DIAGNOSIS — M3501 Sicca syndrome with keratoconjunctivitis: Secondary | ICD-10-CM | POA: Diagnosis not present

## 2021-05-09 DIAGNOSIS — R768 Other specified abnormal immunological findings in serum: Secondary | ICD-10-CM | POA: Insufficient documentation

## 2021-05-10 ENCOUNTER — Other Ambulatory Visit: Payer: Self-pay | Admitting: Medical

## 2021-05-10 DIAGNOSIS — H53122 Transient visual loss, left eye: Secondary | ICD-10-CM

## 2021-05-17 DIAGNOSIS — R911 Solitary pulmonary nodule: Secondary | ICD-10-CM | POA: Diagnosis not present

## 2021-05-23 ENCOUNTER — Telehealth (HOSPITAL_COMMUNITY): Payer: Self-pay | Admitting: Cardiology

## 2021-05-23 DIAGNOSIS — I639 Cerebral infarction, unspecified: Secondary | ICD-10-CM

## 2021-05-23 DIAGNOSIS — R002 Palpitations: Secondary | ICD-10-CM

## 2021-05-23 NOTE — Telephone Encounter (Signed)
Per DrBensimhon patient will need work in appt and echo reason palps  ? ?Arbutus Leas will speak with patient directly to schedule ? ?Orders placed and message to pre cert team ? ?

## 2021-05-24 ENCOUNTER — Other Ambulatory Visit (HOSPITAL_COMMUNITY): Payer: 59

## 2021-05-24 ENCOUNTER — Ambulatory Visit (HOSPITAL_COMMUNITY): Payer: 59 | Admitting: Internal Medicine

## 2021-05-26 NOTE — Telephone Encounter (Signed)
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 ?

## 2021-05-26 NOTE — Addendum Note (Signed)
Addended by: Scarlette Calico on: 05/26/2021 10:13 AM ? ? Modules accepted: Orders ? ?

## 2021-05-29 ENCOUNTER — Ambulatory Visit (HOSPITAL_BASED_OUTPATIENT_CLINIC_OR_DEPARTMENT_OTHER)
Admission: RE | Admit: 2021-05-29 | Discharge: 2021-05-29 | Disposition: A | Discharge status: Home / Self Care | Payer: 59 | Source: Ambulatory Visit | Attending: Internal Medicine | Admitting: Internal Medicine

## 2021-05-29 ENCOUNTER — Other Ambulatory Visit (HOSPITAL_COMMUNITY): Payer: Self-pay | Admitting: Internal Medicine

## 2021-05-29 ENCOUNTER — Ambulatory Visit (HOSPITAL_COMMUNITY)
Admission: RE | Admit: 2021-05-29 | Discharge: 2021-05-29 | Disposition: A | Discharge status: Home / Self Care | Payer: 59 | Source: Ambulatory Visit | Attending: Internal Medicine | Admitting: Internal Medicine

## 2021-05-29 ENCOUNTER — Ambulatory Visit
Admission: RE | Admit: 2021-05-29 | Discharge: 2021-05-29 | Disposition: A | Discharge status: Home / Self Care | Payer: 59 | Source: Ambulatory Visit | Attending: Medical | Admitting: Medical

## 2021-05-29 VITALS — BP 136/78 | HR 75 | Ht 63.0 in | Wt 113.2 lb

## 2021-05-29 DIAGNOSIS — E039 Hypothyroidism, unspecified: Secondary | ICD-10-CM | POA: Insufficient documentation

## 2021-05-29 DIAGNOSIS — R002 Palpitations: Secondary | ICD-10-CM | POA: Insufficient documentation

## 2021-05-29 DIAGNOSIS — I639 Cerebral infarction, unspecified: Secondary | ICD-10-CM | POA: Diagnosis not present

## 2021-05-29 DIAGNOSIS — R0683 Snoring: Secondary | ICD-10-CM | POA: Diagnosis not present

## 2021-05-29 DIAGNOSIS — D8989 Other specified disorders involving the immune mechanism, not elsewhere classified: Secondary | ICD-10-CM | POA: Diagnosis not present

## 2021-05-29 DIAGNOSIS — H539 Unspecified visual disturbance: Secondary | ICD-10-CM | POA: Diagnosis not present

## 2021-05-29 DIAGNOSIS — H53122 Transient visual loss, left eye: Secondary | ICD-10-CM | POA: Diagnosis not present

## 2021-05-29 DIAGNOSIS — Z853 Personal history of malignant neoplasm of breast: Secondary | ICD-10-CM | POA: Insufficient documentation

## 2021-05-29 LAB — ECHOCARDIOGRAM COMPLETE BUBBLE STUDY
Area-P 1/2: 4.12 cm2
S' Lateral: 2 cm

## 2021-05-29 NOTE — Patient Instructions (Signed)
Thank you for your visit today. ? ?Your provider has recommended that  you wear a Zio Patch for 14 days.  This monitor will record your heart rhythm for our review.  IF you have any symptoms while wearing the monitor please press the button.  If you have any issues with the patch or you notice a red or orange light on it please call the company at [phone].  Once you remove the patch please mail it back to the company as soon as possible so we can get the results. ? ?Your physician recommends that you schedule a follow-up appointment as needed. ? ?If you have any questions or concerns before your next appointment please send [place] a message through [place] or call our office at [phone].   ? ?TO LEAVE A MESSAGE FOR THE NURSE SELECT OPTION 2, PLEASE LEAVE A MESSAGE INCLUDING: ?YOUR NAME ?DATE OF BIRTH ?CALL BACK NUMBER ?REASON FOR CALL**this is important as we prioritize the call backs ? ?YOU WILL RECEIVE A CALL BACK THE SAME DAY AS LONG AS YOU CALL BEFORE 4:00 PM ? ?At the [facility], you and your health needs are our priority. As part of our continuing mission to provide you with exceptional heart care, we have created designated Provider Care Teams. These Care Teams include your primary Cardiologist (physician) and Advanced Practice Providers (APPs- Physician Assistants and Nurse Practitioners) who all work together to provide you with the care you need, when you need it.  ? ?You may see any of the following providers on your designated Care Team at your next follow up: ?Dr Glori Bickers ?Dr Loralie Champagne ?Darrick Grinder, NP ?Lyda Jester, PA ?Jessica Milford,NP ?Marlyce Huge, PA ?Audry Riles, PharmD ? ? ?Please be sure to bring in all your medications bottles to every appointment.  ? ? ?

## 2021-05-29 NOTE — Consult Note (Addendum)
? ?[facility] NEW PATIENT NOTE ? ? ?Primary Care: Shon Baton, MD ?Primary Cardiologist: New ? ?HPI: ? 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 Sjogren's vs SLE) who presents for cardiac evaluation in setting of possible ophthalmic stroke.  ? ?Echo today 05/29/21: EF 60-65% Bubble negative ? ?Denies h/o known cardiac disease. Over past few weeks has been having intermittent problems with vision in left eye. Saw ophthalmologist who wanted her to get echo with bubble study and cardiac monitor.  ? ?Cardiac CAC 5/22 = 0 ? ?Denies CP, SOB. Does have occasional palpitations. Particularly at night. Snores some.  ? ?Exercises at [facility] 1-3x/weeks. No CP.  ? ?Review of Systems: [y] = yes, '[ ]'$  = no  ? ?General: Weight gain '[ ]'$ ; Weight loss '[ ]'$ ; Anorexia '[ ]'$ ; Fatigue '[ ]'$ ; Fever '[ ]'$ ; Chills '[ ]'$ ; Weakness '[ ]'$   ?Cardiac: Chest pain/pressure '[ ]'$ ; Resting SOB '[ ]'$ ; Exertional SOB '[ ]'$ ; Orthopnea '[ ]'$ ; Pedal Edema '[ ]'$ ; Palpitations [ y]; Syncope '[ ]'$ ; Presyncope '[ ]'$ ; Paroxysmal nocturnal dyspnea'[ ]'$   ?Pulmonary: Cough '[ ]'$ ; Wheezing'[ ]'$ ; Hemoptysis'[ ]'$ ; Sputum '[ ]'$ ; Snoring '[ ]'$   ?GI: Vomiting'[ ]'$ ; Dysphagia'[ ]'$ ; Melena'[ ]'$ ; Hematochezia '[ ]'$ ; Heartburn[y ]; Abdominal pain '[ ]'$ ; Constipation '[ ]'$ ; Diarrhea '[ ]'$ ; BRBPR '[ ]'$   ?GU: Hematuria'[ ]'$ ; Dysuria '[ ]'$ ; Nocturia'[ ]'$   ?Vascular: Pain in legs with walking '[ ]'$ ; Pain in feet with lying flat '[ ]'$ ; Non-healing sores '[ ]'$ ; Stroke '[ ]'$ ; TIA '[ ]'$ ; Slurred speech '[ ]'$ ;  ?Neuro: Headaches'[ ]'$ ; Vertigo'[ ]'$ ; Seizures'[ ]'$ ; Paresthesias'[ ]'$ ;Blurred vision '[ ]'$ ; Diplopia '[ ]'$ ; Vision changes Blue.Reese ]  ?Ortho/Skin: Arthritis Blue.Reese ]; Joint pain [ y]; Muscle pain '[ ]'$ ; Joint swelling '[ ]'$ ; Back Pain '[ ]'$ ; Rash '[ ]'$   ?Psych: Depression'[ ]'$ ; Anxiety'[ ]'$   ?Heme: Bleeding problems '[ ]'$ ; Clotting disorders '[ ]'$ ; Anemia '[ ]'$   ?Endocrine: Diabetes '[ ]'$ ; Thyroid dysfunction[ y] ? ? ?Past Medical History:  ?Diagnosis Date  ? ANA positive   ? ?Sjogrens  ? Breast neoplasm, Tis (LCIS), left   ?  Cholecystitis   ? Cholelithiasis   ? Dry eye   ? Fibroid   ? Gall stones   ? GERD (gastroesophageal reflux disease)   ? Hypothyroidism   ? Takes synthroid  ? Infertility, female   ? Keratitis 2020  ? Lactose intolerance   ? Leukopenia   ? Sjogren's disease ([place])   ? Thrombocytopenia ([place])   ? Urticaria   ? episodes since childhood   ? Vitamin D deficiency disease   ? Takes Vit D  ? Weight loss   ? ? ?Current Outpatient Medications  ?Medication Sig Dispense Refill  ? clobetasol (TEMOVATE) 0.05 % external solution Apply topically 2 (two) times daily (Patient taking differently: Apply topically as needed.) 50 mL 1  ? cycloSPORINE, PF, (CEQUA) 0.09 % SOLN Place 1 drop into both eyes in the morning and at bedtime.     ? hydroxychloroquine (PLAQUENIL) 200 MG tablet TAKE 2 TABLETS BY MOUTH EVERY OTHER DAY ALTERNATING WITH 1 TABLET EVERY OTHER DAY. 135 tablet 3  ? UNABLE TO FIND Place 1 drop into both eyes 6 (six) times daily. Platelet rich growth factor eye drops    ? VITAMIN D PO Take by mouth. Takes a few times a month    ? ?No current facility-administered medications for this encounter.  ? ? ?Allergies  ?Allergen Reactions  ?  Lactose Intolerance (Gi) Diarrhea  ?  Stomach pain  ? Lifitegrast Other (See Comments)  ?  corneal lesion  ? Latex Rash  ? Systane Balance [Propylene Glycol] Rash  ? Tape Rash  ? Wound Dressing Adhesive Rash  ? ? ?  ?Social History  ? ?Socioeconomic History  ? Marital status: Married  ?  Spouse name: Not on file  ? Number of children: 0  ? Years of education: Not on file  ? Highest education level: Not on file  ?Occupational History  ? Occupation: internal medicine physician/ hospitalist  ?  Employer: [employer]  ?Tobacco Use  ? Smoking status: Never  ? Smokeless tobacco: Never  ?Vaping Use  ? Vaping Use: Never used  ?Substance and Sexual Activity  ? Alcohol use: No  ?  Alcohol/week: 0.0 standard drinks  ? Drug use: No  ? Sexual activity: Yes  ?  Partners: Male  ?  Birth control/protection:  Other-see comments  ?  Comment: infertility  ?Other Topics Concern  ? Not on file  ?Social History Narrative  ? Married  ? ?Social Determinants of Health  ? ?Financial Resource Strain: Not on file  ?Food Insecurity: Not on file  ?Transportation Needs: Not on file  ?Physical Activity: Not on file  ?Stress: Not on file  ?Social Connections: Not on file  ?Intimate Partner Violence: Not on file  ? ? ?  ?Family History  ?Problem Relation Age of Onset  ? Lupus Sister   ? Thyroid disease Sister   ? Thyroid disease Mother   ? ? ?Vitals:  ? 05/29/21 1504  ?BP: 136/78  ?Pulse: 75  ?SpO2: 98%  ?Weight: 51.3 kg (113 lb 3.2 oz)  ?Height: '5\' 3"'$  (1.6 m)  ? ? ?PHYSICAL EXAM: ?General:  Well appearing. No respiratory difficulty ?HEENT: normal ?Neck: supple. no JVD. Carotids 2+ bilat; no bruits. No lymphadenopathy or thryomegaly appreciated. ?Cor: PMI nondisplaced. Regular rate & rhythm. No rubs, gallops or murmurs. ?Lungs: clear ?Abdomen: soft, nontender, nondistended. No hepatosplenomegaly. No bruits or masses. Good bowel sounds. ?Extremities: no cyanosis, clubbing, rash, edema ?Neuro: alert & oriented x 3, cranial nerves grossly intact. moves all 4 extremities w/o difficulty. Affect pleasant. ? ?ECG: NSR 78 Mild LVH Personally reviewed ? ? ?ASSESSMENT & PLAN: ? ?1. Possible ophthalmic stroke ?- Echo 05/29/21 EF 60-65% bubble study negative ?- check Zio ?- suspect low risk for cardiac etiology ?- Pending Brain CT angio per neuro-ophthalmologist ? ?2. Palpitations ?- check Zio ? ?Glori Bickers, MD  ?3:48 PM ?  ?

## 2021-06-02 NOTE — Progress Notes (Signed)
?Charlann Boxer D.O. ?[facility] ?[place] ?Phone: [phone] ?Subjective:   ?I, Sarah [name], am serving as a scribe for Dr. Hulan Saas. ? ?This visit occurred during the SARS-CoV-2 public health emergency.  Safety protocols were in place, including screening questions prior to the visit, additional usage of staff PPE, and extensive cleaning of exam room while observing appropriate contact time as indicated for disinfecting solutions.  ? ?I'm seeing this patient by the request  of:  Shon Baton, MD ? ?CC: Knee pain and multiple complaints follow-up ? ?[id number]  ?04/11/2021 ?Patient has had difficulty with the knees previously and did have the approval for the viscosupplementation.  Patient wants to hold at this point though with her doing relatively better with her avoiding certain activities such as the stairs.  Patient can follow-up with me then.  If any worsening pain we can discuss again about repeating steroid injections or the possibility of viscosupplementation. ? ?Known arthritic changes of the back and the neck.  Discussed the possibility of osteopathic manipulation but patient is doing relatively well at the moment.  Patient is making some progress.  We discussed continuing the different medications if she needed them.  No longer taking the gabapentin.  We discussed the vitamin D as well.  Patient wants to continue with this and may follow-up again in 3 months otherwise. ? ?Updated 06/06/2021 ?Sarah [name] is a 51 y.o. female coming in with complaint of right knee and back pain, at last follow-up patient was doing relatively well.  This was 2 months ago.  Held on any viscosupplementation.  Patient states that she continues to have pain in lumbar and cervical spine. Wonders if this pain will ever go away.  ? ?Also having R hip and lateral R knee pain but believes that her pain on R side might be related to L knee pain. Also notes intermittent cramping in B calves.  Curious if she needs [place] to rule out DVT.  ? ?Reviewing patient's chart she recently been seen by ophthalmology for a transient visual loss ?Patient did have work-up including MRI of the brain that showed some nonspecific white matter changes ?Patient is continuing to be monitoring for a lung nodule that is still somewhat concerning with patient's history of breast cancer ?  ? ?Past Medical History:  ?Diagnosis Date  ? ANA positive   ? ?Sjogrens  ? Breast neoplasm, Tis (LCIS), left   ? Cholecystitis   ? Cholelithiasis   ? Dry eye   ? Fibroid   ? Gall stones   ? GERD (gastroesophageal reflux disease)   ? Hypothyroidism   ? Takes synthroid  ? Infertility, female   ? Keratitis 2020  ? Lactose intolerance   ? Leukopenia   ? Sjogren's disease ([place])   ? Thrombocytopenia ([place])   ? Urticaria   ? episodes since childhood   ? Vitamin D deficiency disease   ? Takes Vit D  ? Weight loss   ? ?Past Surgical History:  ?Procedure Laterality Date  ? CHOLECYSTECTOMY N/A 07/13/2014  ? Procedure: LAPAROSCOPIC CHOLECYSTECTOMY WITH INTRAOPERATIVE CHOLANGIOGRAM;  Surgeon: Rolm Bookbinder, MD;  Location: [facility];  Service: General;  Laterality: N/A;  ? egg retrieval  2008; 2011; 2012  ? multiple IVF cycles  ? ENDOMETRIAL BIOPSY    ? SIMPLE MASTECTOMY WITH AXILLARY SENTINEL NODE BIOPSY Bilateral 07/15/2019  ? Procedure: RIGHT RISK REDUCING MASTECTOMY AND LEFT MASTECTOMY  WITH LEFT AXILLARY SENTINEL NODE BIOPSY;  Surgeon: Rolm Bookbinder,  MD;  Location: [facility];  Service: General;  Laterality: Bilateral;  PEC BLOCK  ? WISDOM TOOTH EXTRACTION    ? ?Social History  ? ?Socioeconomic History  ? Marital status: Married  ?  Spouse name: Not on file  ? Number of children: 0  ? Years of education: Not on file  ? Highest education level: Not on file  ?Occupational History  ? Occupation: internal medicine physician/ hospitalist  ?  Employer: [employer]  ?Tobacco Use  ? Smoking status: Never  ? Smokeless tobacco: Never  ?Vaping Use  ? Vaping Use:  Never used  ?Substance and Sexual Activity  ? Alcohol use: No  ?  Alcohol/week: 0.0 standard drinks  ? Drug use: No  ? Sexual activity: Yes  ?  Partners: Male  ?  Birth control/protection: Other-see comments  ?  Comment: infertility  ?Other Topics Concern  ? Not on file  ?Social History Narrative  ? Married  ? ?Social Determinants of Health  ? ?Financial Resource Strain: Not on file  ?Food Insecurity: Not on file  ?Transportation Needs: Not on file  ?Physical Activity: Not on file  ?Stress: Not on file  ?Social Connections: Not on file  ? ?Allergies  ?Allergen Reactions  ? Lactose Intolerance (Gi) Diarrhea  ?  Stomach pain  ? Lifitegrast Other (See Comments)  ?  corneal lesion  ? Latex Rash  ? Systane Balance [Propylene Glycol] Rash  ? Tape Rash  ? Wound Dressing Adhesive Rash  ? ?Family History  ?Problem Relation Age of Onset  ? Lupus Sister   ? Thyroid disease Sister   ? Thyroid disease Mother   ? ? ? ? ? ? ? ?Current Outpatient Medications (Other):  ?  clobetasol (TEMOVATE) 0.05 % external solution, Apply topically 2 (two) times daily (Patient taking differently: Apply topically as needed.) ?  cycloSPORINE, PF, (CEQUA) 0.09 % SOLN, Place 1 drop into both eyes in the morning and at bedtime.  ?  hydroxychloroquine (PLAQUENIL) 200 MG tablet, TAKE 2 TABLETS BY MOUTH EVERY OTHER DAY ALTERNATING WITH 1 TABLET EVERY OTHER DAY. ?  UNABLE TO FIND, Place 1 drop into both eyes 6 (six) times daily. Platelet rich growth factor eye drops ?  VITAMIN D PO, Take by mouth. Takes a few times a month ? ? ?Reviewed prior external information including notes and imaging from  ?primary care provider ?As well as notes that were available from care everywhere and other healthcare systems. ? ?Past medical history, social, surgical and family history all reviewed in electronic medical record.  No pertanent information unless stated regarding to the chief complaint.  ? ?Review of Systems: ? No headache, visual changes, nausea, vomiting,  diarrhea, constipation, dizziness, abdominal pain, skin rash, fevers, chills, night sweats, weight loss, swollen lymph nodes, body aches, joint swelling, chest pain, shortness of breath, mood changes. POSITIVE muscle aches ? ?Objective  ?Blood pressure 110/70, pulse 75, height '5\' 3"'$  (1.6 m), weight 113 lb (51.3 kg), SpO2 96 %. ?  ?General: No apparent distress alert and oriented x3 mood and affect normal, dressed appropriately.  ?HEENT: Pupils equal, extraocular movements intact  ?Respiratory: Patient's speak in full sentences and does not appear short of breath  ?Cardiovascular: No lower extremity edema, non tender, no erythema  ?Gait normal with good balance and coordination.  ?MSK: Patient is tender to palpation in multiple different areas.  Patient is wearing a ECG on the left side.  Patient is tender to palpation in the lower  back with loss of lordosis.  Left knee exam does have some crepitus noted.  Mild lateral tracking noted of the patella. ? ? ?Osteopathic findings ?C2 flexed rotated and side bent left ?C6 flexed rotated and side bent left ?T3 extended rotated and side bent left inhaled third rib ?L3 flexed rotated and side bent right ?Sacrum right on right ? ?  ?Impression and Recommendations:  ?  ? ?The above documentation has been reviewed and is accurate and complete Lyndal Pulley, DO ? ? ? ?

## 2021-06-06 ENCOUNTER — Encounter: Payer: Self-pay | Admitting: Family Medicine

## 2021-06-06 ENCOUNTER — Ambulatory Visit (INDEPENDENT_AMBULATORY_CARE_PROVIDER_SITE_OTHER): Payer: 59 | Admitting: Family Medicine

## 2021-06-06 ENCOUNTER — Telehealth: Payer: Self-pay | Admitting: Pulmonary Disease

## 2021-06-06 VITALS — BP 110/70 | HR 75 | Ht 63.0 in | Wt 113.0 lb

## 2021-06-06 DIAGNOSIS — M9904 Segmental and somatic dysfunction of sacral region: Secondary | ICD-10-CM

## 2021-06-06 DIAGNOSIS — M9908 Segmental and somatic dysfunction of rib cage: Secondary | ICD-10-CM | POA: Diagnosis not present

## 2021-06-06 DIAGNOSIS — M9902 Segmental and somatic dysfunction of thoracic region: Secondary | ICD-10-CM | POA: Diagnosis not present

## 2021-06-06 DIAGNOSIS — R918 Other nonspecific abnormal finding of lung field: Secondary | ICD-10-CM

## 2021-06-06 DIAGNOSIS — M9901 Segmental and somatic dysfunction of cervical region: Secondary | ICD-10-CM

## 2021-06-06 DIAGNOSIS — M9903 Segmental and somatic dysfunction of lumbar region: Secondary | ICD-10-CM

## 2021-06-06 DIAGNOSIS — M5136 Other intervertebral disc degeneration, lumbar region: Secondary | ICD-10-CM | POA: Diagnosis not present

## 2021-06-06 NOTE — Assessment & Plan Note (Signed)
Patient has a known degenerative disc disease of the lumbar spine.  Patient has responded relatively well to the osteopathic manipulation that we did try today.  Did more muscle energy.  Discussed icing regimen and home exercises, which activities to do and which ones to avoid.  Increase activity slowly.  Follow-up again in 6 to 8 weeks. ?

## 2021-06-06 NOTE — Patient Instructions (Addendum)
Good to see you! ?Started manipulation ? ?

## 2021-06-06 NOTE — Telephone Encounter (Signed)
PCCM: ? ?I called and spoke with patient regarding her recent ct chest at [facility]. Referral placed to Cardiothoracic surgery. ? ?Garner Nash, DO ?[facility] ?06/06/2021 4:03 PM   ? ?

## 2021-06-06 NOTE — Assessment & Plan Note (Signed)
   Decision today to treat with OMT was based on Physical Exam  After verbal consent patient was treated with  ME, FPR techniques in cervical, thoracic, rib, lumbar and sacral areas, all areas are chronic   Patient tolerated the procedure well with improvement in symptoms  Patient given exercises, stretches and lifestyle modifications  See medications in patient instructions if given  Patient will follow up in 4-8 weeks 

## 2021-06-07 ENCOUNTER — Other Ambulatory Visit: Payer: Self-pay

## 2021-06-07 DIAGNOSIS — R21 Rash and other nonspecific skin eruption: Secondary | ICD-10-CM | POA: Insufficient documentation

## 2021-06-09 ENCOUNTER — Other Ambulatory Visit: Payer: Self-pay | Admitting: Medical

## 2021-06-09 DIAGNOSIS — H53122 Transient visual loss, left eye: Secondary | ICD-10-CM

## 2021-06-12 DIAGNOSIS — M3219 Other organ or system involvement in systemic lupus erythematosus: Secondary | ICD-10-CM | POA: Diagnosis not present

## 2021-06-12 DIAGNOSIS — H53122 Transient visual loss, left eye: Secondary | ICD-10-CM | POA: Diagnosis not present

## 2021-06-12 DIAGNOSIS — M3501 Sicca syndrome with keratoconjunctivitis: Secondary | ICD-10-CM | POA: Diagnosis not present

## 2021-06-14 ENCOUNTER — Ambulatory Visit (HOSPITAL_COMMUNITY)
Admission: RE | Admit: 2021-06-14 | Discharge: 2021-06-14 | Disposition: A | Discharge status: Home / Self Care | Payer: 59 | Source: Ambulatory Visit | Attending: Cardiology | Admitting: Cardiology

## 2021-06-14 DIAGNOSIS — M5136 Other intervertebral disc degeneration, lumbar region: Secondary | ICD-10-CM | POA: Insufficient documentation

## 2021-06-14 DIAGNOSIS — M79605 Pain in left leg: Secondary | ICD-10-CM | POA: Diagnosis not present

## 2021-06-16 DIAGNOSIS — R002 Palpitations: Secondary | ICD-10-CM | POA: Diagnosis not present

## 2021-06-20 ENCOUNTER — Ambulatory Visit
Admission: RE | Admit: 2021-06-20 | Discharge: 2021-06-20 | Disposition: A | Discharge status: Home / Self Care | Payer: 59 | Source: Ambulatory Visit | Attending: Medical | Admitting: Medical

## 2021-06-20 DIAGNOSIS — H53122 Transient visual loss, left eye: Secondary | ICD-10-CM | POA: Diagnosis not present

## 2021-06-20 DIAGNOSIS — H5462 Unqualified visual loss, left eye, normal vision right eye: Secondary | ICD-10-CM | POA: Diagnosis not present

## 2021-06-20 DIAGNOSIS — I700 Atherosclerosis of aorta: Secondary | ICD-10-CM | POA: Diagnosis not present

## 2021-06-20 DIAGNOSIS — M47812 Spondylosis without myelopathy or radiculopathy, cervical region: Secondary | ICD-10-CM | POA: Diagnosis not present

## 2021-06-20 DIAGNOSIS — I6523 Occlusion and stenosis of bilateral carotid arteries: Secondary | ICD-10-CM | POA: Diagnosis not present

## 2021-06-20 MED ORDER — IOPAMIDOL (ISOVUE-370) INJECTION 76%
75.0000 mL | Freq: Once | INTRAVENOUS | Status: AC | PRN
  Administered 2021-06-20: 75 mL via INTRAVENOUS

## 2021-06-23 ENCOUNTER — Encounter: Payer: 59 | Admitting: Thoracic Surgery (Cardiothoracic Vascular Surgery)

## 2021-06-28 ENCOUNTER — Ambulatory Visit (HOSPITAL_COMMUNITY)
Admission: RE | Admit: 2021-06-28 | Discharge: 2021-06-28 | Disposition: A | Discharge status: Home / Self Care | Payer: 59 | Source: Ambulatory Visit | Attending: Family Medicine | Admitting: Family Medicine

## 2021-06-28 DIAGNOSIS — M5136 Other intervertebral disc degeneration, lumbar region: Secondary | ICD-10-CM

## 2021-06-28 DIAGNOSIS — M79662 Pain in left lower leg: Secondary | ICD-10-CM | POA: Diagnosis not present

## 2021-06-28 DIAGNOSIS — M79661 Pain in right lower leg: Secondary | ICD-10-CM | POA: Diagnosis not present

## 2021-06-29 NOTE — Progress Notes (Unsigned)
[address]       [name] [address]             [phone]                    Sarah [name] [place] Medical Record #[id number] Date of Birth: October 20, 1970  Referring: Shon Baton, MD Primary Care: Shon Baton, MD Primary Cardiologist: None  Chief Complaint:   No chief complaint on file.   History of Present Illness:    Sarah [name] 51 y.o. female ***   This is a 51 year old female, ANA positive, question of Sjogren's, breast cancer.  December 2021 patient had a small subcentimeter groundglass nodule within the right lower lobe on CT imaging.  She had lobular carcinoma in situ and after risk-benefit discussion with breast surgeon made the decision for bilateral mastectomy versus continued surveillance of her breast for malignancy.  She overall is anxious regarding the lower lobe nodule.  She had a follow-up image from May 2021 CT 30 May 2020 calcium coronary scoring CT which also showed persistence of the groundglass nodule.  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  We discussed the data on the risk of malignancy associated with groundglass opacities in the Asian descent.  Some data would suggest upwards of 40 to 50% of persistent GGO's may be consistent with malignancy.  Due to the size of her lesion we discussed neck steps to include close surveillance. Smoking Hx: ***   Zubrod Score: At the time of surgery this patient's most appropriate activity status/level should be described as: '[]'$     0    Normal activity, no symptoms '[]'$     1    Restricted in physical strenuous activity but ambulatory, able to do out light work '[]'$     2    Ambulatory and capable of self care, unable to do work activities, up and about               >50 % of waking hours                              '[]'$     3    Only limited self care, in bed greater than 50% of waking hours '[]'$     4    Completely disabled, no self care, confined to bed or chair '[]'$     5    Moribund   Past Medical History:  Diagnosis Date    ANA positive    ?Sjogrens   Breast neoplasm, Tis (LCIS), left    Cholecystitis    Cholelithiasis    Dry eye    Fibroid    Gall stones    GERD (gastroesophageal reflux disease)    Hypothyroidism    Takes synthroid   Infertility, female    Keratitis 2020   Lactose intolerance    Leukopenia    Sjogren's disease ([facility])    Thrombocytopenia ([facility])    Urticaria    episodes since childhood    Vitamin D deficiency disease    Takes Vit D   Weight loss     Past Surgical History:  Procedure Laterality Date   CHOLECYSTECTOMY N/A 07/13/2014   Procedure: LAPAROSCOPIC CHOLECYSTECTOMY WITH INTRAOPERATIVE CHOLANGIOGRAM;  Surgeon: Rolm Bookbinder, MD;  Location[facility] OR;  Service: General;  Laterality: N/A;   egg retrieval  2008; 2011; 2012   multiple IVF cycles   ENDOMETRIAL BIOPSY  SIMPLE MASTECTOMY WITH AXILLARY SENTINEL NODE BIOPSY Bilateral 07/15/2019   Procedure: RIGHT RISK REDUCING MASTECTOMY AND LEFT MASTECTOMY  WITH LEFT AXILLARY SENTINEL NODE BIOPSY;  Surgeon: Rolm Bookbinder, MD;  Location: [facility];  Service: General;  Laterality: Bilateral;  PEC BLOCK   WISDOM TOOTH EXTRACTION      Family History  Problem Relation Age of Onset   Lupus Sister    Thyroid disease Sister    Thyroid disease Mother      Social History   Tobacco Use  Smoking Status Never  Smokeless Tobacco Never    Social History   Substance and Sexual Activity  Alcohol Use No   Alcohol/week: 0.0 standard drinks     Allergies  Allergen Reactions   Lactose Intolerance (Gi) Diarrhea    Stomach pain   Lifitegrast Other (See Comments)    corneal lesion   Latex Rash   Systane Balance [Propylene Glycol] Rash   Tape Rash   Wound Dressing Adhesive Rash    Current Outpatient Medications  Medication Sig Dispense Refill   clobetasol (TEMOVATE) 0.05 % external solution Apply topically 2 (two) times daily (Patient taking differently: Apply topically as needed.) 50 mL 1   cycloSPORINE, PF, (CEQUA) 0.09 %  SOLN Place 1 drop into both eyes in the morning and at bedtime.      hydroxychloroquine (PLAQUENIL) 200 MG tablet TAKE 2 TABLETS BY MOUTH EVERY OTHER DAY ALTERNATING WITH 1 TABLET EVERY OTHER DAY. 135 tablet 3   UNABLE TO FIND Place 1 drop into both eyes 6 (six) times daily. Platelet rich growth factor eye drops     VITAMIN D PO Take by mouth. Takes a few times a month     No current facility-administered medications for this visit.    ROS   PHYSICAL EXAMINATION: There were no vitals taken for this visit. Physical Exam  Diagnostic Studies & Laboratory data:     Recent Radiology Findings:   CT ANGIO HEAD NECK W WO CM  Result Date: 06/20/2021 CLINICAL DATA:  Transient visual loss of left eye H53.122 (ICD-10-CM). EXAM: CT ANGIOGRAPHY HEAD AND NECK TECHNIQUE: Multidetector CT imaging of the head and neck was performed using the standard protocol during bolus administration of intravenous contrast. Multiplanar CT image reconstructions and MIPs were obtained to evaluate the vascular anatomy. Carotid stenosis measurements (when applicable) are obtained utilizing NASCET criteria, using the distal internal carotid diameter as the denominator. RADIATION DOSE REDUCTION: This exam was performed according to the departmental dose-optimization program which includes automated exposure control, adjustment of the mA and/or kV according to patient size and/or use of iterative reconstruction technique. CONTRAST:  68m ISOVUE-370 IOPAMIDOL (ISOVUE-370) INJECTION 76% COMPARISON:  MRI of the brain May 29, 2021. FINDINGS: CT HEAD FINDINGS Brain: No evidence of acute infarction, hemorrhage, hydrocephalus, extra-axial collection or mass lesion/mass effect. Vascular: No hyperdense vessel or unexpected calcification. Skull: Normal. Negative for fracture or focal lesion. Sinuses: Imaged portions are clear. Orbits: No acute finding. Review of the MIP images confirms the above findings CTA NECK FINDINGS Aortic arch: Standard  branching. Imaged portion shows no evidence of aneurysm or dissection. Mild calcified atherosclerotic plaques are seen in the aortic arch and at the origin of the major arch vessels without significant stenosis. Right carotid system: Mild atherosclerotic changes of the right carotid bifurcation hemodynamically significant stenosis. Left carotid system: Mild atherosclerotic changes of the left carotid bifurcation without hemodynamically significant stenosis. Vertebral arteries: Codominant. No evidence of dissection, stenosis (50% or greater) or occlusion. Skeleton: Degenerative  changes of the cervical spine with bilateral neural foraminal narrowing at C4-5 and C6-7, and on the right at C5-6. Other neck: Negative. Upper chest: Negative Review of the MIP images confirms the above findings CTA HEAD FINDINGS Anterior circulation: No significant stenosis, proximal occlusion, aneurysm, or vascular malformation. Posterior circulation: No significant stenosis, proximal occlusion, aneurysm, or vascular malformation. Venous sinuses: As permitted by contrast timing, patent. Anatomic variants: Right fetal PCA. Review of the MIP images confirms the above findings IMPRESSION: 1. No acute intracranial abnormality. 2. Mild atherosclerotic changes of the bilateral carotid bifurcation without hemodynamically significant stenosis. 3. No significant intracranial stenosis or occlusion. 4. Aortic atherosclerosis. Electronically Signed   By: Pedro Earls M.D.   On: 06/20/2021 17:43   VAS [place] LOWER EXT ART SEG MULTI (SEGMENTALS & LE RAYNAUDS)  Result Date: 06/28/2021  LOWER EXTREMITY DOPPLER STUDY Patient Name:  Sarah [name]    Date of Exam:   06/28/2021 Medical Rec #: [id number]  Accession #:    [id number] Date of Birth: [birth year]  Patient Gender: F Patient Age:   43 years Exam Location:  [facility] Procedure:      VAS [place] LOWER EXT ART SEG MULTI (SEGMENTALS & LE RAYNAUDS) Referring Phys: Hulan Saas  --------------------------------------------------------------------------------  Indications: Patient denies true claudication symptoms. Patient reports              intermittent right and left calf pain with walking but not              everytime she walks. Her left calf has been hurting intermittently              for about a year and her right calf for about six months. High Risk Factors: No history of smoking. Other Factors: Cervical disc disorder with radiculopathy of cervical region                S/P bilateral mastectomy.  Comparison Study: NA Performing Technologist: Leavy Cella RDCS  Examination Guidelines: A complete evaluation includes at minimum, Doppler waveform signals and systolic blood pressure reading at the level of bilateral brachial, anterior tibial, and posterior tibial arteries, when vessel segments are accessible. Bilateral testing is considered an integral part of a complete examination. Photoelectric Plethysmograph (PPG) waveforms and toe systolic pressure readings are included as required and additional duplex testing as needed. Limited examinations for reoccurring indications may be performed as noted.  ABI Findings: +---------+------------------+-----+---------+--------+ Right    Rt Pressure (mmHg)IndexWaveform Comment  +---------+------------------+-----+---------+--------+ Brachial 129                                      +---------+------------------+-----+---------+--------+ CFA                             triphasic         +---------+------------------+-----+---------+--------+ Popliteal                       triphasic         +---------+------------------+-----+---------+--------+ PTA      172               1.33 triphasic         +---------+------------------+-----+---------+--------+ PERO     157               1.22 biphasic          +---------+------------------+-----+---------+--------+  DP       169               1.31 triphasic          +---------+------------------+-----+---------+--------+ Great Toe132               1.02 Normal            +---------+------------------+-----+---------+--------+ +---------+------------------+-----+---------+-------+ Left     Lt Pressure (mmHg)IndexWaveform Comment +---------+------------------+-----+---------+-------+ CFA                             triphasic        +---------+------------------+-----+---------+-------+ Popliteal                       triphasic        +---------+------------------+-----+---------+-------+ PTA      160               1.24 triphasic        +---------+------------------+-----+---------+-------+ PERO     137               1.06 triphasic        +---------+------------------+-----+---------+-------+ DP       132               1.02 biphasic         +---------+------------------+-----+---------+-------+ Great Toe107               0.83 Normal           +---------+------------------+-----+---------+-------+ +-------+-----------+-----------+------------+------------+ ABI/TBIToday's ABIToday's TBIPrevious ABIPrevious TBI +-------+-----------+-----------+------------+------------+ Right  1.33       1.02                                +-------+-----------+-----------+------------+------------+ Left   1.24       0.83                                +-------+-----------+-----------+------------+------------+   Summary: Right: Resting right ankle-brachial index is within normal range. No evidence of significant right lower extremity arterial disease. The right toe-brachial index is normal. Left: Resting left ankle-brachial index is within normal range. No evidence of significant left lower extremity arterial disease. The left toe-brachial index is normal. *See table(s) above for measurements and observations.  Electronically signed by Carlyle Dolly MD on 06/28/2021 at 2:01:08 PM.    Final    LONG TERM MONITOR (3-14 DAYS)  Result  Date: 06/17/2021 Patch Wear Time:  13 days and 16 hours (2023-05-01T16:08:49-0400 to 2023-05-15T08:52:22-0400) 1. Sinus rhythm - avg HR 74 bpm. 2. Three runs of SVT occurred, the run with the fastest interval lasting 4 beats with a max rate of 114 bpm, the longest lasting 9 beats with an avg rate of 99 bpm. 3. Rare PACs and PVCs. 4. No patient-triggered events in diary. Glori Bickers, MD 5:45 PM  VAS [place] LOWER EXTREMITY VENOUS (DVT)  Result Date: 06/14/2021  Lower Venous DVT Study Patient Name:  Sarah [name]    Date of Exam:   06/14/2021 Medical Rec #: [id number]  Accession #:    [id number] Date of Birth: 1970-05-10  Patient Gender: F Patient Age:   42 years Exam Location:  [facility] Procedure:      VAS [place] LOWER EXTREMITY VENOUS (DVT) Referring Phys: Hulan Saas --------------------------------------------------------------------------------  Other Indications: Patient complains of intermittent  leg pain for several weeks.                    She denies shortness of breath. Risk Factors: None identified. Performing Technologist: Wilkie Aye RVT  Examination Guidelines: A complete evaluation includes B-mode imaging, spectral Doppler, color Doppler, and power Doppler as needed of all accessible portions of each vessel. Bilateral testing is considered an integral part of a complete examination. Limited examinations for reoccurring indications may be performed as noted. The reflux portion of the exam is performed with the patient in reverse Trendelenburg.  +---------+---------------+---------+-----------+----------+--------------+ RIGHT    CompressibilityPhasicitySpontaneityPropertiesThrombus Aging +---------+---------------+---------+-----------+----------+--------------+ CFV      Full           Yes      Yes                                 +---------+---------------+---------+-----------+----------+--------------+ SFJ      Full           Yes      Yes                                  +---------+---------------+---------+-----------+----------+--------------+ FV Prox  Full           Yes      Yes                                 +---------+---------------+---------+-----------+----------+--------------+ FV Mid   Full           Yes      Yes                                 +---------+---------------+---------+-----------+----------+--------------+ FV DistalFull           Yes      Yes                                 +---------+---------------+---------+-----------+----------+--------------+ PFV      Full           Yes      Yes                                 +---------+---------------+---------+-----------+----------+--------------+ POP      Full           Yes      Yes                                 +---------+---------------+---------+-----------+----------+--------------+ PTV      Full           Yes      Yes                                 +---------+---------------+---------+-----------+----------+--------------+ PERO     Full           Yes      Yes                                 +---------+---------------+---------+-----------+----------+--------------+  Gastroc  Full                                                        +---------+---------------+---------+-----------+----------+--------------+ GSV      Full           Yes      Yes                                 +---------+---------------+---------+-----------+----------+--------------+   +---------+---------------+---------+-----------+----------+--------------+ LEFT     CompressibilityPhasicitySpontaneityPropertiesThrombus Aging +---------+---------------+---------+-----------+----------+--------------+ CFV      Full           Yes      Yes                                 +---------+---------------+---------+-----------+----------+--------------+ SFJ      Full           Yes      Yes                                  +---------+---------------+---------+-----------+----------+--------------+ FV Prox  Full           Yes      Yes                                 +---------+---------------+---------+-----------+----------+--------------+ FV Mid   Full           Yes      Yes                                 +---------+---------------+---------+-----------+----------+--------------+ FV DistalFull           Yes      Yes                                 +---------+---------------+---------+-----------+----------+--------------+ PFV      Full           Yes      Yes                                 +---------+---------------+---------+-----------+----------+--------------+ POP      Full           Yes      Yes                                 +---------+---------------+---------+-----------+----------+--------------+ PTV      Full           Yes      Yes                                 +---------+---------------+---------+-----------+----------+--------------+ PERO     Full           Yes      Yes                                 +---------+---------------+---------+-----------+----------+--------------+  Gastroc  Full                                                        +---------+---------------+---------+-----------+----------+--------------+ GSV      Full           Yes      Yes                                 +---------+---------------+---------+-----------+----------+--------------+     Summary: BILATERAL: - No evidence of deep vein thrombosis seen in the lower extremities, bilaterally. - No evidence of superficial venous thrombosis in the lower extremities, bilaterally. -No evidence of popliteal cyst, bilaterally.   *See table(s) above for measurements and observations. Electronically signed by Jenkins Rouge MD on 06/14/2021 at 11:54:03 AM.    Final        I have independently reviewed the above radiology studies  and reviewed the findings with the patient.   Recent Lab  Findings: Lab Results  Component Value Date   WBC 3.0 (L) 07/14/2019   HGB 12.5 07/14/2019   HCT 38.8 07/14/2019   PLT 131 (L) 07/14/2019   GLUCOSE 88 04/06/2019   CHOL 191 07/17/2018   TRIG 167.0 (H) 07/17/2018   HDL 53.70 07/17/2018   LDLCALC 104 (H) 07/17/2018   ALT 16 04/06/2019   AST 18 04/06/2019   NA 138 04/06/2019   K 4.4 04/06/2019   CL 104 04/06/2019   CREATININE 0.64 04/06/2019   BUN 14 04/06/2019   CO2 29 04/06/2019   TSH 1.05 04/06/2019     PFTs:  - FVC: ***% - FEV1: ***% -DLCO: ***%  Problem List: 8 mm right lower lobe groundglass opacity. History of breast cancer.  Assessment / Plan:   ***     I  spent {CHL ONC TIME VISIT - JEHUD:[id number]} with  the patient face to face in counseling and coordination of care.    Lajuana Matte 06/29/2021 12:59 PM

## 2021-06-30 ENCOUNTER — Institutional Professional Consult (permissible substitution) (INDEPENDENT_AMBULATORY_CARE_PROVIDER_SITE_OTHER): Payer: 59 | Admitting: Thoracic Surgery (Cardiothoracic Vascular Surgery)

## 2021-06-30 VITALS — BP 124/78 | HR 79 | Resp 20 | Ht 63.0 in | Wt 110.0 lb

## 2021-06-30 DIAGNOSIS — R911 Solitary pulmonary nodule: Secondary | ICD-10-CM

## 2021-07-12 ENCOUNTER — Other Ambulatory Visit: Payer: Self-pay | Admitting: Pulmonary Disease

## 2021-07-12 DIAGNOSIS — R911 Solitary pulmonary nodule: Secondary | ICD-10-CM

## 2021-07-14 ENCOUNTER — Ambulatory Visit (HOSPITAL_BASED_OUTPATIENT_CLINIC_OR_DEPARTMENT_OTHER): Payer: 59 | Admitting: Obstetrics & Gynecology

## 2021-07-14 DIAGNOSIS — Z853 Personal history of malignant neoplasm of breast: Secondary | ICD-10-CM | POA: Diagnosis not present

## 2021-07-14 DIAGNOSIS — Z01818 Encounter for other preprocedural examination: Secondary | ICD-10-CM | POA: Diagnosis not present

## 2021-07-14 DIAGNOSIS — R918 Other nonspecific abnormal finding of lung field: Secondary | ICD-10-CM | POA: Diagnosis not present

## 2021-07-14 DIAGNOSIS — R911 Solitary pulmonary nodule: Secondary | ICD-10-CM | POA: Diagnosis not present

## 2021-07-17 ENCOUNTER — Inpatient Hospital Stay: Admission: RE | Admit: 2021-07-17 | Payer: 59 | Source: Ambulatory Visit

## 2021-07-26 ENCOUNTER — Other Ambulatory Visit (HOSPITAL_COMMUNITY): Payer: Self-pay

## 2021-07-26 DIAGNOSIS — H04123 Dry eye syndrome of bilateral lacrimal glands: Secondary | ICD-10-CM | POA: Diagnosis not present

## 2021-07-26 DIAGNOSIS — M3501 Sicca syndrome with keratoconjunctivitis: Secondary | ICD-10-CM | POA: Diagnosis not present

## 2021-07-26 DIAGNOSIS — Z79899 Other long term (current) drug therapy: Secondary | ICD-10-CM | POA: Diagnosis not present

## 2021-07-26 DIAGNOSIS — H04129 Dry eye syndrome of unspecified lacrimal gland: Secondary | ICD-10-CM | POA: Diagnosis not present

## 2021-07-26 MED ORDER — CEQUA 0.09 % OP SOLN
1.0000 [drp] | Freq: Two times a day (BID) | OPHTHALMIC | 4 refills | Status: DC
  Filled 2021-07-26: qty 180, 90d supply, fill #0
  Filled 2022-06-01: qty 180, 90d supply, fill #1

## 2021-07-27 ENCOUNTER — Other Ambulatory Visit (HOSPITAL_COMMUNITY): Payer: Self-pay

## 2021-07-28 NOTE — Progress Notes (Signed)
[place] Phone: [phone] Subjective:   Sarah [name], am serving as a scribe for Dr. Hulan [name].  I'm seeing this patient by the request  of:  Sarah Baton, MD  CC: Multiple complaints  GEX:[id number]  Sarah [name] is a 51 y.o. female coming in with complaint of back and neck pain. OMT on 06/06/2021. Patient states that her R hip is better. Pain is present in SI joints, L>R. Pain on right improved after manipulation. Also having less radiating pain in R leg.   Had Cts since last visit of neck and head. Would like to discuss results.  Patient did have a CT scan Degenerative disc disease of the cervical spine that is fairly severe with some spondylolisthesis noted.  Facet arthropathy causing some foraminal narrowing noted.  Using brace for L knee which is helping locking symptoms.   Medications patient has been prescribed: None  =         Reviewed prior external information including notes and imaging from previsou exam, outside providers and external EMR if available.   As well as notes that were available from care everywhere and other healthcare systems.  Did review [facility] and groundglass opacity recent evaluation where they are watching it due to it being stable over the course of the last 2 years.  Note states that surgery risk outweighs benefits.  Patient has been also seen rheumatology for Sjogren's syndrome as well as questionable lupus.  Past medical history, social, surgical and family history all reviewed in electronic medical record.  [name] pertanent information unless stated regarding to the chief complaint.   Past Medical History:  Diagnosis Date   ANA positive    ?Sjogrens   Breast neoplasm, Tis (LCIS), left    Cholecystitis    Cholelithiasis    Dry eye    Fibroid    Gall stones    GERD (gastroesophageal reflux disease)    Hypothyroidism    Takes synthroid   Infertility, female     Keratitis 2020   Lactose intolerance    Leukopenia    Sjogren's disease ([facility])    Thrombocytopenia ([facility])    Urticaria    episodes since childhood    Vitamin D deficiency disease    Takes Vit D   Weight loss     Allergies  Allergen Reactions   Lactose Intolerance (Gi) Diarrhea    Stomach pain   Lifitegrast Other (See Comments)    corneal lesion   Latex Rash   Systane Balance [Propylene Glycol] Rash   Tape Rash   Wound Dressing Adhesive Rash     Review of Systems:  [name] headache, visual changes, nausea, vomiting, diarrhea, constipation, dizziness, abdominal pain, skin rash, fevers, chills, night sweats, weight loss, swollen lymph nodes, , joint swelling, chest pain, shortness of breath, mood changes. POSITIVE muscle aches, body aches  Objective  Blood pressure 110/76, pulse 71, height '5\' 3"'$  (1.6 m), weight 114 lb (51.7 kg).   General: [name] apparent distress alert and oriented x3 mood and affect normal, dressed appropriately.  HEENT: Pupils equal, extraocular movements intact  Respiratory: Patient's speak in full sentences and does not appear short of breath  Cardiovascular: [name] lower extremity edema, non tender, [name] erythema  Gait MSK:  Neck exam shows the patient is uncomfortable overall.  Continues to adjust well even with sitting.  Patient does have some tenderness to palpation in the paraspinal musculature from the cervical spine to  the lumbar spine.  Patient does have tightness noted over the right parascapular region.  Osteopathic findings  C2 flexed rotated and side bent right C5 flexed rotated and side bent left C7 flexed rotated and side bent right T5 extended rotated and side bent right inhaled rib T9 extended rotated and side bent left L2 flexed rotated and side bent right Sacrum right on right       Assessment and Plan:  Degenerative lumbar disc Known degenerative disc disease, underlying autoimmune disorder as well.  Does have degenerative disc disease of  the cervical spine that is also contributing as well).  Patient does respond relatively well though to osteopathic patient at the moment.  Discussed the possibility of either an MRI of the cervical spine if necessary or the possibility of epidural could be helpful to.  Patient wants to avoid this at the moment.  He is continuing with the home exercises and the osteopathic manipulation.  Follow-up with me again in 6 to 8 weeks.  Total time reviewing outside records in imaging 31 minutes    Nonallopathic problems  Decision today to treat with OMT was based on Physical Exam  After verbal consent patient was treated with  ME, FPR techniques in cervical, rib, thoracic, lumbar, and sacral  areas  Patient tolerated the procedure well with improvement in symptoms  Patient given exercises, stretches and lifestyle modifications  See medications in patient instructions if given  Patient will follow up in 4-8 weeks     The above documentation has been reviewed and is accurate and complete Sarah Pulley, DO         Note: This dictation was prepared with Dragon dictation along with smaller phrase technology. Any transcriptional errors that result from this process are unintentional.

## 2021-07-31 DIAGNOSIS — I700 Atherosclerosis of aorta: Secondary | ICD-10-CM | POA: Diagnosis not present

## 2021-07-31 DIAGNOSIS — E559 Vitamin D deficiency, unspecified: Secondary | ICD-10-CM | POA: Diagnosis not present

## 2021-07-31 DIAGNOSIS — E039 Hypothyroidism, unspecified: Secondary | ICD-10-CM | POA: Diagnosis not present

## 2021-08-08 ENCOUNTER — Ambulatory Visit (INDEPENDENT_AMBULATORY_CARE_PROVIDER_SITE_OTHER): Payer: 59 | Admitting: Family Medicine

## 2021-08-08 VITALS — BP 110/76 | HR 71 | Ht 63.0 in | Wt 114.0 lb

## 2021-08-08 DIAGNOSIS — M9903 Segmental and somatic dysfunction of lumbar region: Secondary | ICD-10-CM | POA: Diagnosis not present

## 2021-08-08 DIAGNOSIS — M9901 Segmental and somatic dysfunction of cervical region: Secondary | ICD-10-CM

## 2021-08-08 DIAGNOSIS — M5136 Other intervertebral disc degeneration, lumbar region: Secondary | ICD-10-CM

## 2021-08-08 DIAGNOSIS — M9902 Segmental and somatic dysfunction of thoracic region: Secondary | ICD-10-CM | POA: Diagnosis not present

## 2021-08-08 DIAGNOSIS — M51369 Other intervertebral disc degeneration, lumbar region without mention of lumbar back pain or lower extremity pain: Secondary | ICD-10-CM

## 2021-08-08 DIAGNOSIS — M5010 Cervical disc disorder with radiculopathy, unspecified cervical region: Secondary | ICD-10-CM

## 2021-08-08 DIAGNOSIS — M9908 Segmental and somatic dysfunction of rib cage: Secondary | ICD-10-CM

## 2021-08-08 DIAGNOSIS — M9904 Segmental and somatic dysfunction of sacral region: Secondary | ICD-10-CM

## 2021-08-08 NOTE — Assessment & Plan Note (Signed)
Known degenerative disc disease, underlying autoimmune disorder as well.  Does have degenerative disc disease of the cervical spine that is also contributing as well).  Patient does respond relatively well though to osteopathic patient at the moment.  Discussed the possibility of either an MRI of the cervical spine if necessary or the possibility of epidural could be helpful to.  Patient wants to avoid this at the moment.  He is continuing with the home exercises and the osteopathic manipulation.  Follow-up with me again in 6 to 8 weeks.  Total time reviewing outside records in imaging 31 minutes

## 2021-08-08 NOTE — Patient Instructions (Signed)
Keep hands in peripheral vision I know it is a lot but I am here if you need me Use vibration plate See me again in 6-8 weeks

## 2021-08-10 DIAGNOSIS — M351 Other overlap syndromes: Secondary | ICD-10-CM | POA: Diagnosis not present

## 2021-08-10 DIAGNOSIS — E739 Lactose intolerance, unspecified: Secondary | ICD-10-CM | POA: Diagnosis not present

## 2021-08-10 DIAGNOSIS — Z79899 Other long term (current) drug therapy: Secondary | ICD-10-CM | POA: Diagnosis not present

## 2021-08-10 DIAGNOSIS — Z0000 Encounter for general adult medical examination without abnormal findings: Secondary | ICD-10-CM | POA: Diagnosis not present

## 2021-08-10 DIAGNOSIS — R5383 Other fatigue: Secondary | ICD-10-CM | POA: Diagnosis not present

## 2021-08-10 DIAGNOSIS — Z1331 Encounter for screening for depression: Secondary | ICD-10-CM | POA: Diagnosis not present

## 2021-08-10 DIAGNOSIS — M8588 Other specified disorders of bone density and structure, other site: Secondary | ICD-10-CM | POA: Diagnosis not present

## 2021-08-10 DIAGNOSIS — I700 Atherosclerosis of aorta: Secondary | ICD-10-CM | POA: Diagnosis not present

## 2021-08-10 DIAGNOSIS — R82998 Other abnormal findings in urine: Secondary | ICD-10-CM | POA: Diagnosis not present

## 2021-08-10 DIAGNOSIS — E876 Hypokalemia: Secondary | ICD-10-CM | POA: Diagnosis not present

## 2021-08-10 DIAGNOSIS — E785 Hyperlipidemia, unspecified: Secondary | ICD-10-CM | POA: Diagnosis not present

## 2021-08-10 DIAGNOSIS — D61818 Other pancytopenia: Secondary | ICD-10-CM | POA: Diagnosis not present

## 2021-08-10 DIAGNOSIS — Z1389 Encounter for screening for other disorder: Secondary | ICD-10-CM | POA: Diagnosis not present

## 2021-08-14 ENCOUNTER — Other Ambulatory Visit (HOSPITAL_COMMUNITY): Payer: Self-pay

## 2021-08-14 MED ORDER — ROSUVASTATIN CALCIUM 5 MG PO TABS
5.0000 mg | ORAL_TABLET | ORAL | 3 refills | Status: DC
  Filled 2021-08-14: qty 24, 84d supply, fill #0

## 2021-08-21 DIAGNOSIS — H53122 Transient visual loss, left eye: Secondary | ICD-10-CM | POA: Diagnosis not present

## 2021-08-28 DIAGNOSIS — M791 Myalgia, unspecified site: Secondary | ICD-10-CM | POA: Diagnosis not present

## 2021-08-28 DIAGNOSIS — Z6820 Body mass index (BMI) 20.0-20.9, adult: Secondary | ICD-10-CM | POA: Diagnosis not present

## 2021-08-28 DIAGNOSIS — Z79899 Other long term (current) drug therapy: Secondary | ICD-10-CM | POA: Diagnosis not present

## 2021-08-28 DIAGNOSIS — M2560 Stiffness of unspecified joint, not elsewhere classified: Secondary | ICD-10-CM | POA: Diagnosis not present

## 2021-08-28 DIAGNOSIS — M3500 Sicca syndrome, unspecified: Secondary | ICD-10-CM | POA: Diagnosis not present

## 2021-08-28 DIAGNOSIS — H169 Unspecified keratitis: Secondary | ICD-10-CM | POA: Diagnosis not present

## 2021-08-28 DIAGNOSIS — R5382 Chronic fatigue, unspecified: Secondary | ICD-10-CM | POA: Diagnosis not present

## 2021-09-11 ENCOUNTER — Encounter (HOSPITAL_BASED_OUTPATIENT_CLINIC_OR_DEPARTMENT_OTHER): Payer: Self-pay | Admitting: Obstetrics & Gynecology

## 2021-09-11 ENCOUNTER — Ambulatory Visit (INDEPENDENT_AMBULATORY_CARE_PROVIDER_SITE_OTHER): Payer: 59 | Admitting: Obstetrics & Gynecology

## 2021-09-11 VITALS — BP 129/77 | HR 74 | Ht 63.0 in | Wt 115.0 lb

## 2021-09-11 DIAGNOSIS — Z853 Personal history of malignant neoplasm of breast: Secondary | ICD-10-CM | POA: Diagnosis not present

## 2021-09-11 DIAGNOSIS — D251 Intramural leiomyoma of uterus: Secondary | ICD-10-CM | POA: Diagnosis not present

## 2021-09-11 DIAGNOSIS — D0502 Lobular carcinoma in situ of left breast: Secondary | ICD-10-CM

## 2021-09-11 DIAGNOSIS — Z01419 Encounter for gynecological examination (general) (routine) without abnormal findings: Secondary | ICD-10-CM | POA: Diagnosis not present

## 2021-09-11 DIAGNOSIS — M3501 Sicca syndrome with keratoconjunctivitis: Secondary | ICD-10-CM | POA: Diagnosis not present

## 2021-09-11 NOTE — Progress Notes (Signed)
51 y.o. [id number] Married [place] female here for annual exam.  Reports cycles are changing.  Has skipped up to two months.  Flow lasts about 5 days.  Flow is heavier for two days with some spotting and then bleeding stops.        Sexually active: Yes.    The current method of family planning is none.    Exercising: Yes.     Smoker:  no  Health Maintenance: Pap:  07/12/2020 Normal, neg HR HPV History of abnormal Pap:  no MMG:  bilateral mastectomy Colonoscopy:  06/15/2019, follow up 10 years BMD:   has done with Dr. Virgina Jock.  Osteopenia present.  Pt thinks was in 2021. Screening Labs: does with Dr. Virgina Jock   reports that she has never smoked. She has never used smokeless tobacco. She reports that she does not drink alcohol and does not use drugs.  Past Medical History:  Diagnosis Date   ANA positive    ?Sjogrens   Breast neoplasm, Tis (LCIS), left    Cholecystitis    Cholelithiasis    Dry eye    Fibroid    Gall stones    GERD (gastroesophageal reflux disease)    Hypothyroidism    Takes synthroid   Infertility, female    Keratitis 2020   Lactose intolerance    Leukopenia    Sjogren's disease ([place])    Thrombocytopenia (HCC)    Urticaria    episodes since childhood    Vitamin D deficiency disease    Takes Vit D   Weight loss     Past Surgical History:  Procedure Laterality Date   CHOLECYSTECTOMY N/A 07/13/2014   Procedure: LAPAROSCOPIC CHOLECYSTECTOMY WITH INTRAOPERATIVE CHOLANGIOGRAM;  Surgeon: Rolm Bookbinder, MD;  Location: [facility];  Service: General;  Laterality: N/A;   egg retrieval  2008; 2011; 2012   multiple IVF cycles   ENDOMETRIAL BIOPSY     SIMPLE MASTECTOMY WITH AXILLARY SENTINEL NODE BIOPSY Bilateral 07/15/2019   Procedure: RIGHT RISK REDUCING MASTECTOMY AND LEFT MASTECTOMY  WITH LEFT AXILLARY SENTINEL NODE BIOPSY;  Surgeon: Rolm Bookbinder, MD;  Location: [place];  Service: General;  Laterality: Bilateral;  PEC BLOCK   WISDOM TOOTH EXTRACTION      Current  Outpatient Medications  Medication Sig Dispense Refill   clobetasol (TEMOVATE) 0.05 % external solution Apply topically 2 (two) times daily (Patient taking differently: Apply topically as needed.) 50 mL 1   cycloSPORINE, PF, (CEQUA) 0.09 % SOLN Place 1 drop into both eyes in the morning and at bedtime.      cycloSPORINE, PF, (CEQUA) 0.09 % SOLN Place 1 drop into both eyes 2 (two) times daily 180 each 4   hydroxychloroquine (PLAQUENIL) 200 MG tablet TAKE 2 TABLETS BY MOUTH EVERY OTHER DAY ALTERNATING WITH 1 TABLET EVERY OTHER DAY. 135 tablet 3   rosuvastatin (CRESTOR) 5 MG tablet Take 1 tablet (5 mg total) by mouth 2 (two) times a week. 24 tablet 3   UNABLE TO FIND Place 1 drop into both eyes 6 (six) times daily. Platelet rich growth factor eye drops     VITAMIN D PO Take by mouth. Takes a few times a month     No current facility-administered medications for this visit.    Family History  Problem Relation Age of Onset   Lupus Sister    Thyroid disease Sister    Thyroid disease Mother     ROS: Constitutional: negative Genitourinary:negative  Exam:   BP 129/77 (BP Location: Right Arm, Patient  Position: Sitting, Cuff Size: Normal)   Pulse 74   Ht '5\' 3"'$  (1.6 m)   Wt 115 lb (52.2 kg)   BMI 20.37 kg/m   Height: '5\' 3"'$  (160 cm)  General appearance: alert, cooperative and appears stated age Head: Normocephalic, without obvious abnormality, atraumatic Neck: no adenopathy, supple, symmetrical, trachea midline and thyroid normal to inspection and palpation Lungs: clear to auscultation bilaterally Breasts: surgically absent.  Well healed scars.  No masses/LAD. Heart: regular rate and rhythm Abdomen: soft, non-tender; bowel sounds normal; no masses,  no organomegaly Extremities: extremities normal, atraumatic, no cyanosis or edema Skin: Skin color, texture, turgor normal. No rashes or lesions Lymph nodes: Cervical, supraclavicular, and axillary nodes normal. No abnormal inguinal nodes  palpated Neurologic: Grossly normal   Pelvic: External genitalia:  small pigmented lesion on left labia majora              Urethra:  normal appearing urethra with no masses, tenderness or lesions              Bartholins and Skenes: normal                 Vagina: normal appearing vagina with normal color and no discharge, no lesions              Cervix: no lesions              Pap taken: No. Bimanual Exam:  Uterus:  globular about 6-8 weeks              Adnexa: normal adnexa and no mass, fullness, tenderness               Rectovaginal: Confirms               Anus:  normal sphincter tone, no lesions  Chaperone, Octaviano Batty, CMA, was present for exam.  Assessment/Plan: 1. Well woman exam with routine gynecological exam - Pap smear 2022 with neg HR HPV - Mammogram not indicated - Colonoscopy 2021 - Bone mineral density done with Dr. Keane Police office - lab work done done with PCP - vaccines reviewed/updated.  Shingrix vaccination discussed.  2. History of bilateral breast cancer  3. Sjogren syndrome with keratoconjunctivitis ([place]) - followed at [facility] and with Dr. Trudie Reed  4. 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 Intramural leiomyoma of uterus

## 2021-09-12 ENCOUNTER — Ambulatory Visit (HOSPITAL_BASED_OUTPATIENT_CLINIC_OR_DEPARTMENT_OTHER): Payer: 59 | Admitting: Obstetrics & Gynecology

## 2021-09-13 ENCOUNTER — Ambulatory Visit (HOSPITAL_BASED_OUTPATIENT_CLINIC_OR_DEPARTMENT_OTHER): Payer: 59 | Admitting: Obstetrics & Gynecology

## 2021-09-14 NOTE — Progress Notes (Signed)
[name] [address] [place] Phone: [phone] Subjective:   [name], am serving as a scribe for Dr. Hulan Saas.  I'm seeing this patient by the request  of:  Shon Baton, MD  CC: back and neck pain f/u  [id number]  Sarah [name] is a 51 y.o. female coming in with complaint of back and neck pain. OMT on 08/08/2021. Patient states trap and shoulder bothersome. Left side feels numb daily. Chin tucks help.  Medications patient has been prescribed: None  Taking:         Reviewed prior external information including notes and imaging from previsou exam, outside providers and external EMR if available.   As well as notes that were available from care everywhere and other healthcare systems.  Past medical history, social, surgical and family history all reviewed in electronic medical record.  No pertanent information unless stated regarding to the chief complaint.   Past Medical History:  Diagnosis Date   ANA positive    ?Sjogrens   Breast neoplasm, Tis (LCIS), left    Cholecystitis    Cholelithiasis    Dry eye    Fibroid    Gall stones    GERD (gastroesophageal reflux disease)    Hypothyroidism    Takes synthroid   Infertility, female    Keratitis 2020   Lactose intolerance    Leukopenia    Sjogren's disease (HCC)    Thrombocytopenia (HCC)    Urticaria    episodes since childhood    Vitamin D deficiency disease    Takes Vit D   Weight loss     Allergies  Allergen Reactions   Lactose Intolerance (Gi) Diarrhea    Stomach pain   Lifitegrast Other (See Comments)    corneal lesion   Latex Rash   Systane Balance [Propylene Glycol] Rash   Tape Rash   Wound Dressing Adhesive Rash     Review of Systems:  No headache, visual changes, nausea, vomiting, diarrhea, constipation, dizziness, abdominal pain, skin rash, fevers, chills, night sweats, weight loss, swollen lymph nodes, body aches, joint swelling,  chest pain, shortness of breath, mood changes. POSITIVE muscle aches  Objective  Blood pressure 128/76, pulse 84, height '5\' 3"'$  (1.6 m), weight 114 lb (51.7 kg), SpO2 98 %.   General: No apparent distress alert and oriented x3 mood and affect normal, dressed appropriately.  HEENT: Pupils equal, extraocular movements intact  Respiratory: Patient's speak in full sentences and does not appear short of breath  Cardiovascular: No lower extremity edema, non tender, no erythema  MSK:  Back does have some tightness noted in the thoracolumbar juncture.  Does have some tenderness to palpation in the parascapular region as well.  Low back does have some tightness with FABER test right greater than left.  Osteopathic findings  C2 flexed rotated and side bent right C5 flexed rotated and side bent left T3 extended rotated and side bent right inhaled rib T8 extended rotated and side bent left L2 flexed rotated and side bent right Sacrum right on right     Assessment and Plan:  Degenerative lumbar disc Degenerative disc of the lumbar spine as well as of the neck.  Discussed with patient about icing regimen and home exercises, which activities to do and which ones to avoid.  Increase activity slowly otherwise.  Follow-up again in 6 to 8 weeks    Nonallopathic problems  Decision today to treat with OMT was based on Physical  Exam  After verbal consent patient was treated with HVLA, ME, FPR techniques in cervical, rib, thoracic, lumbar, and sacral  areas  Patient tolerated the procedure well with improvement in symptoms  Patient given exercises, stretches and lifestyle modifications  See medications in patient instructions if given  Patient will follow up in 4-8 weeks    The above documentation has been reviewed and is accurate and complete Sarah Pulley, DO          Note: This dictation was prepared with Dragon dictation along with smaller phrase technology. Any transcriptional  errors that result from this process are unintentional.

## 2021-09-19 ENCOUNTER — Ambulatory Visit (INDEPENDENT_AMBULATORY_CARE_PROVIDER_SITE_OTHER): Payer: 59 | Admitting: Family Medicine

## 2021-09-19 VITALS — BP 128/76 | HR 84 | Ht 63.0 in | Wt 114.0 lb

## 2021-09-19 DIAGNOSIS — M5136 Other intervertebral disc degeneration, lumbar region: Secondary | ICD-10-CM

## 2021-09-19 DIAGNOSIS — M9901 Segmental and somatic dysfunction of cervical region: Secondary | ICD-10-CM

## 2021-09-19 DIAGNOSIS — M9903 Segmental and somatic dysfunction of lumbar region: Secondary | ICD-10-CM

## 2021-09-19 DIAGNOSIS — M9904 Segmental and somatic dysfunction of sacral region: Secondary | ICD-10-CM | POA: Diagnosis not present

## 2021-09-19 DIAGNOSIS — M9908 Segmental and somatic dysfunction of rib cage: Secondary | ICD-10-CM | POA: Diagnosis not present

## 2021-09-19 DIAGNOSIS — M9902 Segmental and somatic dysfunction of thoracic region: Secondary | ICD-10-CM

## 2021-09-19 NOTE — Assessment & Plan Note (Signed)
Degenerative disc of the lumbar spine as well as of the neck.  Discussed with patient about icing regimen and home exercises, which activities to do and which ones to avoid.  Increase activity slowly otherwise.  Follow-up again in 6 to 8 weeks

## 2021-09-19 NOTE — Patient Instructions (Signed)
Monitor eye level See me in 7-8 weeks

## 2021-10-05 ENCOUNTER — Encounter: Payer: Self-pay | Admitting: Internal Medicine

## 2021-10-05 ENCOUNTER — Other Ambulatory Visit: Payer: Self-pay

## 2021-10-05 ENCOUNTER — Ambulatory Visit: Payer: 59 | Attending: Internal Medicine | Admitting: Internal Medicine

## 2021-10-05 VITALS — BP 120/80 | HR 74 | Ht 63.0 in | Wt 119.0 lb

## 2021-10-05 DIAGNOSIS — I6523 Occlusion and stenosis of bilateral carotid arteries: Secondary | ICD-10-CM | POA: Diagnosis not present

## 2021-10-05 DIAGNOSIS — E785 Hyperlipidemia, unspecified: Secondary | ICD-10-CM

## 2021-10-05 DIAGNOSIS — I700 Atherosclerosis of aorta: Secondary | ICD-10-CM | POA: Diagnosis not present

## 2021-10-05 NOTE — Progress Notes (Signed)
LIPID CLINIC CONSULT NOTE  Chief Complaint:  Manage dyslipidemia  Primary Care Physician: Shon Baton, MD  Primary Cardiologist:  None  HPI:  Sarah [name] is a 51 y.o. female who is being seen today for dyslipidemia (self-referred). 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  She reports recent work-up for potential TIA which involved a CT angiogram of the head and neck that demonstrated mild atherosclerotic changes at the carotid bifurcations as well as aortic atherosclerosis.  Prior to that in May 2022 she underwent calcium scoring which showed no coronary calcification.  There was however mild calcification of the descending aorta.  As part of her work-up she saw Dr. Haroldine Laws who ordered a monitor and echo with bubble study.  The bubble study was negative which showed normal LV function.  The monitor showed 3 episodes of SVT.  She says she has them intermittently and sometimes a little more sustained.  She is not on medication for this.  She had a lipid NMR through her primary care provider in July showing total LDL particle number of 1291, LDL-C of 111, HDL-C of 63, triglycerides 110 and a small LDL particle number was low at 259.  LDL size is large at 21.8 Angstrom.  I reviewed those results with her today.  Subsequent to this she was started on rosuvastatin 5 mg twice weekly.  She has not had repeat lipids.  She does report that her mother has some carotid atherosclerosis however she is in her 24s.  She reports recent significant changes in her diet including increasing fish, decreasing meats and more vegetables, less sugar and more exercise.  PMHx:  Past Medical History:  Diagnosis Date   ANA positive    ?Sjogrens   Breast neoplasm, Tis (LCIS), left    Cholecystitis    Cholelithiasis    Dry eye    Fibroid    Gall stones    GERD (gastroesophageal reflux disease)    Hypothyroidism    Takes synthroid    Infertility, female    Keratitis 2020   Lactose intolerance    Leukopenia    Sjogren's disease ([place])    Thrombocytopenia (HCC)    Urticaria    episodes since childhood    Vitamin D deficiency disease    Takes Vit D   Weight loss     Past Surgical History:  Procedure Laterality Date   CHOLECYSTECTOMY N/A 07/13/2014   Procedure: LAPAROSCOPIC CHOLECYSTECTOMY WITH INTRAOPERATIVE CHOLANGIOGRAM;  Surgeon: Rolm Bookbinder, MD;  Location: [facility];  Service: General;  Laterality: N/A;   egg retrieval  2008; 2011; 2012   multiple IVF cycles   ENDOMETRIAL BIOPSY     SIMPLE MASTECTOMY WITH AXILLARY SENTINEL NODE BIOPSY Bilateral 07/15/2019   Procedure: RIGHT RISK REDUCING MASTECTOMY AND LEFT MASTECTOMY  WITH LEFT AXILLARY SENTINEL NODE BIOPSY;  Surgeon: Rolm Bookbinder, MD;  Location: [facility];  Service: General;  Laterality: Bilateral;  PEC BLOCK   WISDOM TOOTH EXTRACTION      FAMHx:  Family History  Problem Relation Age of Onset   Lupus Sister    Thyroid disease Sister    Thyroid disease Mother     SOCHx:   reports that she has never smoked. She has never used smokeless tobacco. She reports that she does not drink alcohol and does not use drugs.  ALLERGIES:  Allergies  Allergen Reactions   Lactose Intolerance (Gi) Diarrhea    Stomach pain   Lifitegrast Other (See  Comments)    corneal lesion   Latex Rash   Systane Balance [Propylene Glycol] Rash   Tape Rash   Wound Dressing Adhesive Rash    ROS: Pertinent items noted in HPI and remainder of comprehensive ROS otherwise negative.  HOME MEDS: Current Outpatient Medications on File Prior to Visit  Medication Sig Dispense Refill   clobetasol (TEMOVATE) 0.05 % external solution Apply topically 2 (two) times daily 50 mL 1   cycloSPORINE, PF, (CEQUA) 0.09 % SOLN Place 1 drop into both eyes 2 (two) times daily 180 each 4   hydroxychloroquine (PLAQUENIL) 200 MG tablet TAKE 2 TABLETS BY MOUTH EVERY OTHER DAY ALTERNATING WITH 1 TABLET  EVERY OTHER DAY. 135 tablet 3   rosuvastatin (CRESTOR) 5 MG tablet Take 1 tablet (5 mg total) by mouth 2 (two) times a week. 24 tablet 3   UNABLE TO FIND Place 1 drop into both eyes 6 (six) times daily. Platelet rich growth factor eye drops     VITAMIN D PO Take by mouth. Takes a few times a month     No current facility-administered medications on file prior to visit.    LABS/IMAGING: No results found for this or any previous visit (from the past 48 hour(s)). No results found.  LIPID PANEL:    Component Value Date/Time   CHOL 191 07/17/2018 1333   CHOL 217 (H) 09/12/2016 0941   TRIG 167.0 (H) 07/17/2018 1333   HDL 53.70 07/17/2018 1333   HDL 67 09/12/2016 0941   CHOLHDL 4 07/17/2018 1333   VLDL 33.4 07/17/2018 1333   LDLCALC 104 (H) 07/17/2018 1333   LDLCALC 125 (H) 09/12/2016 0941    WEIGHTS: Wt Readings from Last 3 Encounters:  10/05/21 119 lb (54 kg)  09/19/21 114 lb (51.7 kg)  09/11/21 115 lb (52.2 kg)    VITALS: BP 120/80 (BP Location: Left Arm, Patient Position: Sitting, Cuff Size: Normal)   Pulse 74   Ht '5\' 3"'$  (1.6 m)   Wt 119 lb (54 kg)   SpO2 99%   BMI 21.08 kg/m   EXAM: Deferred  EKG: Deferred  ASSESSMENT: Mixed dyslipidemia, goal LDL less than 70 Carotid and aortic atherosclerosis Zero coronary calcium (05/2020)  PLAN: 1.   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  Her lipid profile is lower risk indicating a higher particle number however her small LDL particle number is low and LDL size is elevated suggesting a lower risk.  I agree with rosuvastatin.  Although she is of Asian descent, this could increase the serum levels and risk of myopathy however she has had no side effects on her current therapy.  I would like to repeat her NMR as well as check an LP(a) which may explain the excess of LDL-P.  We will further adjust medications based on that.  Follow-up with me in 6 months or sooner as necessary.  Pixie Casino, MD, [facility], [place] Director of the Advanced Lipid Disorders &  Cardiovascular Risk Reduction Clinic Diplomate of the American Board of Clinical Lipidology Attending Cardiologist  Direct Dial: [phone]  Fax: [phone]  Website:  [email or web address] 10/05/2021, 8:33 AM

## 2021-10-05 NOTE — Patient Instructions (Signed)
Medication Instructions:  NO CHANGES today  *If you need a refill on your cardiac medications before your next appointment, please call your pharmacy*   Lab Work: NMR lipoprofile, LPa today   FASTING lab work in 6 months -- before next visit  If you have labs (blood work) drawn today and your tests are completely normal, you will receive your results only by: [place] (if you have MyChart) OR A paper copy in the mail If you have any lab test that is abnormal or we need to change your treatment, we will call you to review the results.   Testing/Procedures: NONE   Follow-Up: At [facility], you and your health needs are our priority.  As part of our continuing mission to provide you with exceptional heart care, we have created designated Provider Care Teams.  These Care Teams include your primary Cardiologist (physician) and Advanced Practice Providers (APPs -  Physician Assistants and Nurse Practitioners) who all work together to provide you with the care you need, when you need it.  We recommend signing up for the patient portal called "MyChart".  Sign up information is provided on this After Visit Summary.  MyChart is used to connect with patients for Virtual Visits (Telemedicine).  Patients are able to view lab/test results, encounter notes, upcoming appointments, etc.  Non-urgent messages can be sent to your provider as well.   To learn more about what you can do with MyChart, go to [email or web address].    Your next appointment:   6 months with Dr. Debara Pickett -- lipid clinic

## 2021-10-07 LAB — NMR, LIPOPROFILE
Cholesterol, Total: 210 mg/dL — ABNORMAL HIGH (ref 100–199)
HDL Particle Number: 39.5 umol/L (ref >=30.5)
HDL-C: 80 mg/dL (ref >39)
LDL Particle Number: 1318 nmol/L — ABNORMAL HIGH (ref <1000)
LDL Size: 21.4 nm (ref >20.5)
LDL-C (NIH Calc): 121 mg/dL — ABNORMAL HIGH (ref 0–99)
LP-IR Score: <25 (ref <=45)
Small LDL Particle Number: 325 nmol/L (ref <=527)
Triglycerides: 52 mg/dL (ref 0–149)

## 2021-10-07 LAB — LIPOPROTEIN A (LPA): Lipoprotein (a): <8.4 nmol/L (ref <75.0)

## 2021-10-16 ENCOUNTER — Other Ambulatory Visit: Payer: Self-pay | Admitting: *Deleted

## 2021-10-16 ENCOUNTER — Other Ambulatory Visit (HOSPITAL_COMMUNITY): Payer: Self-pay

## 2021-10-16 DIAGNOSIS — E785 Hyperlipidemia, unspecified: Secondary | ICD-10-CM

## 2021-10-16 MED ORDER — ROSUVASTATIN CALCIUM 20 MG PO TABS
20.0000 mg | ORAL_TABLET | ORAL | 3 refills | Status: DC
  Filled 2021-10-16: qty 25, 87d supply, fill #0
  Filled 2021-11-23 – 2021-11-24 (×2): qty 25, 87d supply, fill #1

## 2021-10-17 ENCOUNTER — Other Ambulatory Visit (HOSPITAL_COMMUNITY): Payer: Self-pay

## 2021-11-03 NOTE — Progress Notes (Deleted)
  [place] Phone: [phone] Subjective:    I'm seeing this patient by the request  of:  Shon Baton, MD  CC:   [id number]  [place] is a 51 y.o. female coming in with complaint of back and neck pain. OMT on 09/19/2021. Patient states   Medications patient has been prescribed: None  Taking:         Reviewed prior external information including notes and imaging from previsou exam, outside providers and external EMR if available.   As well as notes that were available from care everywhere and other healthcare systems.  Past medical history, social, surgical and family history all reviewed in electronic medical record.  No pertanent information unless stated regarding to the chief complaint.   Past Medical History:  Diagnosis Date   ANA positive    ?Sjogrens   Breast neoplasm, Tis (LCIS), left    Cholecystitis    Cholelithiasis    Dry eye    Fibroid    Gall stones    GERD (gastroesophageal reflux disease)    Hypothyroidism    Takes synthroid   Infertility, female    Keratitis 2020   Lactose intolerance    Leukopenia    Sjogren's disease (HCC)    Thrombocytopenia (HCC)    Urticaria    episodes since childhood    Vitamin D deficiency disease    Takes Vit D   Weight loss     Allergies  Allergen Reactions   Lactose Intolerance (Gi) Diarrhea    Stomach pain   Lifitegrast Other (See Comments)    corneal lesion   Latex Rash   Systane Balance [Propylene Glycol] Rash   Tape Rash   Wound Dressing Adhesive Rash     Review of Systems:  No headache, visual changes, nausea, vomiting, diarrhea, constipation, dizziness, abdominal pain, skin rash, fevers, chills, night sweats, weight loss, swollen lymph nodes, body aches, joint swelling, chest pain, shortness of breath, mood changes. POSITIVE muscle aches  Objective  There were no vitals taken for this visit.   General: No apparent distress  alert and oriented x3 mood and affect normal, dressed appropriately.  HEENT: Pupils equal, extraocular movements intact  Respiratory: Patient's speak in full sentences and does not appear short of breath  Cardiovascular: No lower extremity edema, non tender, no erythema  Gait MSK:  Back   Osteopathic findings  C2 flexed rotated and side bent right C6 flexed rotated and side bent left T3 extended rotated and side bent right inhaled rib T9 extended rotated and side bent left L2 flexed rotated and side bent right Sacrum right on right       Assessment and Plan:  No problem-specific Assessment & Plan notes found for this encounter.    Nonallopathic problems  Decision today to treat with OMT was based on Physical Exam  After verbal consent patient was treated with HVLA, ME, FPR techniques in cervical, rib, thoracic, lumbar, and sacral  areas  Patient tolerated the procedure well with improvement in symptoms  Patient given exercises, stretches and lifestyle modifications  See medications in patient instructions if given  Patient will follow up in 4-8 weeks             Note: This dictation was prepared with Dragon dictation along with smaller phrase technology. Any transcriptional errors that result from this process are unintentional.

## 2021-11-06 ENCOUNTER — Telehealth: Payer: Self-pay | Admitting: Student

## 2021-11-06 ENCOUNTER — Ambulatory Visit
Admission: RE | Admit: 2021-11-06 | Discharge: 2021-11-06 | Disposition: A | Discharge status: Home / Self Care | Payer: 59 | Source: Ambulatory Visit | Attending: Pulmonary Disease | Admitting: Pulmonary Disease

## 2021-11-06 ENCOUNTER — Ambulatory Visit: Payer: 59 | Admitting: Family Medicine

## 2021-11-06 DIAGNOSIS — R911 Solitary pulmonary nodule: Secondary | ICD-10-CM

## 2021-11-06 DIAGNOSIS — I700 Atherosclerosis of aorta: Secondary | ICD-10-CM | POA: Diagnosis not present

## 2021-11-06 NOTE — Telephone Encounter (Signed)
My chart message with results sent

## 2021-11-07 ENCOUNTER — Ambulatory Visit: Payer: 59 | Admitting: Family Medicine

## 2021-11-16 NOTE — Progress Notes (Signed)
[facility] Phone: [phone] Subjective:   Sarah [name], am serving as a scribe for Dr. Hulan Saas.  I'm seeing this patient by the request  of:  Shon Baton, MD  CC: Neck pain worsening, back pain follow-up  FBP:ZWCHENIDPO  Sarah [name] is a 51 y.o. female coming in with complaint of back and neck pain. OMT on 09/19/2021. Patient states that L knee is doing better. Pain in knee occurs most often when doing down stairs. Pain medial and posterior medial. Pain in R knee is also improving.   SI joint has also improved. Feels manipulation is helpful. Woke up on Sunday with pain in L SI but that pain is improved.   Upper back pain persists and has especially tight rhomboids. L>R. Patient has numbness in L scapula that is [name] worse than last visit.  Patient states that it is unfortunately still affecting daily activities, waking her up at night.  Movement never without some discomfort in the area.  Medications patient has been prescribed: None  Taking:         Reviewed prior external information including notes and imaging from previsou exam, outside providers and external EMR if available.   As well as notes that were available from care everywhere and other healthcare systems.  Past medical history, social, surgical and family history all reviewed in electronic medical record.  [name] pertanent information unless stated regarding to the chief complaint.   Past Medical History:  Diagnosis Date   ANA positive    ?Sjogrens   Breast neoplasm, Tis (LCIS), left    Cholecystitis    Cholelithiasis    Dry eye    Fibroid    Gall stones    GERD (gastroesophageal reflux disease)    Hypothyroidism    Takes synthroid   Infertility, female    Keratitis 2020   Lactose intolerance    Leukopenia    Sjogren's disease (HCC)    Thrombocytopenia (HCC)    Urticaria    episodes since childhood    Vitamin D deficiency  disease    Takes Vit D   Weight loss     Allergies  Allergen Reactions   Lactose Intolerance (Gi) Diarrhea    Stomach pain   Lifitegrast Other (See Comments)    corneal lesion   Latex Rash   Systane Balance [Propylene Glycol] Rash   Tape Rash   Wound Dressing Adhesive Rash     Review of Systems:  [name] headache, visual changes, nausea, vomiting, diarrhea, constipation, dizziness, abdominal pain, skin rash, fevers, chills, night sweats, weight loss, swollen lymph nodes, body aches, joint swelling, chest pain, shortness of breath, mood changes. POSITIVE muscle aches  Objective  Blood pressure 104/72, pulse 80, height '5\' 3"'$  (1.6 m), weight 116 lb (52.6 kg), SpO2 97 %.   General: [name] apparent distress alert and oriented x3 mood and affect normal, dressed appropriately.  HEENT: Pupils equal, extraocular movements intact  Respiratory: Patient's speak in full sentences and does not appear short of breath  Cardiovascular: [name] lower extremity edema, non tender, [name] erythema  Gait normal overall MSK:  Neck exam does have loss of lordosis.  Patient continues to try to bring the left shoulder blade back.  Positive for Spurling's noted on the left side.  Good grip strength though noted likely Low back exam does have loss of lordosis.  Patient is tender to palpation over both sacroiliac's right greater than left.  Osteopathic  findings   T3 extended rotated and side bent left  inhaled rib L2 flexed rotated and side bent right Sacrum right on right       Assessment and Plan:  Degenerative lumbar disc Patient does have degenerative disc disease of the lumbar and cervical spine that can unfortunately continue to have exacerbations.  He does have underlying autoimmune disorder that also contributes as well.  Due to his send patient's past medical history significant for other ailments that this does make treatment somewhat difficult with social determinants of health.  Patient does respond well  to osteopathic manipulation.  Still trying to avoid too many prescription medications which regimen very happy patient is doing relatively well at the moment. Problem list patient is starting to have some increasing in discomfort when it comes to her neck with something crease in radicular symptoms.  Due to the severity of this and patient already trying different medications as well as physical therapy I do feel that advanced imaging is warranted.  This will tell us if there is any erosive changes.  Could be a candidate for possible epidurals depending what we find.  Follow-up again 6 to 8 weeks otherwise.  Cervical disc disorder with radiculopathy of cervical region As stated previously patient has had difficulty with her neck.  Some limited sidebending bilaterally with radicular symptoms.  Failed all conservative therapy including formal physical therapy, home exercises and we did try some other prescription medications initially including anti-inflammatories and gabapentin.  We will see how patient advanced imaging is warranted at this point and depending on findings could change medical management.    Nonallopathic problems  Decision today to treat with OMT was based on Physical Exam  After verbal consent patient was treated with HVLA, ME, FPR techniques in  rib, thoracic, lumbar, and sacral  areas  Patient tolerated the procedure well with improvement in symptoms  Patient given exercises, stretches and lifestyle modifications  See medications in patient instructions if given  Patient will follow up in 4-8 weeks     The above documentation has been reviewed and is accurate and complete Lyndal Pulley, DO         Note: This dictation was prepared with Dragon dictation along with smaller phrase technology. Any transcriptional errors that result from this process are unintentional.

## 2021-11-20 ENCOUNTER — Ambulatory Visit (INDEPENDENT_AMBULATORY_CARE_PROVIDER_SITE_OTHER): Payer: 59 | Admitting: Family Medicine

## 2021-11-20 ENCOUNTER — Encounter: Payer: Self-pay | Admitting: Family Medicine

## 2021-11-20 VITALS — BP 104/72 | HR 80 | Ht 63.0 in | Wt 116.0 lb

## 2021-11-20 DIAGNOSIS — M542 Cervicalgia: Secondary | ICD-10-CM

## 2021-11-20 DIAGNOSIS — M9908 Segmental and somatic dysfunction of rib cage: Secondary | ICD-10-CM

## 2021-11-20 DIAGNOSIS — M9902 Segmental and somatic dysfunction of thoracic region: Secondary | ICD-10-CM | POA: Diagnosis not present

## 2021-11-20 DIAGNOSIS — M9904 Segmental and somatic dysfunction of sacral region: Secondary | ICD-10-CM

## 2021-11-20 DIAGNOSIS — M9903 Segmental and somatic dysfunction of lumbar region: Secondary | ICD-10-CM | POA: Diagnosis not present

## 2021-11-20 DIAGNOSIS — M5010 Cervical disc disorder with radiculopathy, unspecified cervical region: Secondary | ICD-10-CM | POA: Diagnosis not present

## 2021-11-20 DIAGNOSIS — M5136 Other intervertebral disc degeneration, lumbar region: Secondary | ICD-10-CM

## 2021-11-20 NOTE — Patient Instructions (Signed)
MRI cervical spine [phone] Keep doing exercises Love the weights but keep them low Back and Balance massage therapy See me in 7-8 weeks

## 2021-11-21 DIAGNOSIS — E7849 Other hyperlipidemia: Secondary | ICD-10-CM | POA: Diagnosis not present

## 2021-11-21 DIAGNOSIS — H53122 Transient visual loss, left eye: Secondary | ICD-10-CM | POA: Diagnosis not present

## 2021-11-21 NOTE — Assessment & Plan Note (Signed)
As stated previously patient has had difficulty with her neck.  Some limited sidebending bilaterally with radicular symptoms.  Failed all conservative therapy including formal physical therapy, home exercises and we did try some other prescription medications initially including anti-inflammatories and gabapentin.  We will see how patient advanced imaging is warranted at this point and depending on findings could change medical management.

## 2021-11-21 NOTE — Assessment & Plan Note (Signed)
Patient does have degenerative disc disease of the lumbar and cervical spine that can unfortunately continue to have exacerbations.  He does have underlying autoimmune disorder that also contributes as well.  Due to his send patient's past medical history significant for other ailments that this does make treatment somewhat difficult with social determinants of health.  Patient does respond well to osteopathic manipulation.  Still trying to avoid too many prescription medications which regimen very happy patient is doing relatively well at the moment. Problem list patient is starting to have some increasing in discomfort when it comes to her neck with something crease in radicular symptoms.  Due to the severity of this and patient already trying different medications as well as physical therapy I do feel that advanced imaging is warranted.  This will tell us if there is any erosive changes.  Could be a candidate for possible epidurals depending what we find.  Follow-up again 6 to 8 weeks otherwise.

## 2021-11-24 ENCOUNTER — Other Ambulatory Visit (HOSPITAL_COMMUNITY): Payer: Self-pay

## 2021-11-30 ENCOUNTER — Encounter: Payer: Self-pay | Admitting: Pulmonary Disease

## 2021-11-30 ENCOUNTER — Encounter: Payer: Self-pay | Admitting: Internal Medicine

## 2021-11-30 ENCOUNTER — Ambulatory Visit (INDEPENDENT_AMBULATORY_CARE_PROVIDER_SITE_OTHER): Payer: 59 | Admitting: Pulmonary Disease

## 2021-11-30 VITALS — BP 110/80 | HR 75 | Ht 63.0 in | Wt 115.8 lb

## 2021-11-30 DIAGNOSIS — Z9013 Acquired absence of bilateral breasts and nipples: Secondary | ICD-10-CM | POA: Diagnosis not present

## 2021-11-30 DIAGNOSIS — E785 Hyperlipidemia, unspecified: Secondary | ICD-10-CM

## 2021-11-30 DIAGNOSIS — Z139 Encounter for screening, unspecified: Secondary | ICD-10-CM | POA: Diagnosis not present

## 2021-11-30 DIAGNOSIS — I700 Atherosclerosis of aorta: Secondary | ICD-10-CM

## 2021-11-30 DIAGNOSIS — R911 Solitary pulmonary nodule: Secondary | ICD-10-CM

## 2021-11-30 DIAGNOSIS — R918 Other nonspecific abnormal finding of lung field: Secondary | ICD-10-CM | POA: Diagnosis not present

## 2021-11-30 NOTE — Progress Notes (Signed)
Synopsis: Referred in September 2022 for pulmonary nodule by Shon Baton, MD  Subjective:   PATIENT ID: Sarah [name] GENDER: female DOB: [birth year], MRN: [id number]  Chief Complaint  Patient presents with   Follow-up    CT     This is a 51 year old female, ANA positive, question of Sjogren's, breast cancer.  December 2021 patient had a small subcentimeter groundglass nodule within the right lower lobe on CT imaging.  She had lobular carcinoma in situ and after risk-benefit discussion with breast surgeon made the decision for bilateral mastectomy versus continued surveillance of her breast for malignancy.  She overall is anxious regarding the lower lobe nodule.  She had a follow-up image from May 2021 CT 30 May 2020 calcium coronary scoring CT which also showed persistence of the groundglass nodule.  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  We discussed the data on the risk of malignancy associated with groundglass opacities in the Asian descent.  Some data would suggest upwards of 40 to 50% of persistent GGO's may be consistent with malignancy.  Due to the size of her lesion we discussed neck steps to include close surveillance.  OV 11/30/2021: here today for follow up on the RLL GGO nodule.  Patient has been seen by [facility] thoracic surgery.  Discussed various options to include segmentectomy versus wedge.  Here today for follow-up regarding CT imaging.  Nodules read on CT is stable but if you look a little closer at the margins for measurement I do think it is grown about 2 mm or so.  Persistence of the groundglass lesion is still there.     Past Medical History:  Diagnosis Date   ANA positive    ?Sjogrens   Breast neoplasm, Tis (LCIS), left    Cholecystitis    Cholelithiasis    Dry eye    Fibroid    Gall stones    GERD (gastroesophageal reflux disease)    Hypothyroidism    Takes synthroid   Infertility, female    Keratitis 2020   Lactose intolerance    Leukopenia    Sjogren's disease (HCC)     Thrombocytopenia (HCC)    Urticaria    episodes since childhood    Vitamin D deficiency disease    Takes Vit D   Weight loss      Family History  Problem Relation Age of Onset   Lupus Sister    Thyroid disease Sister    Thyroid disease Mother      Past Surgical History:  Procedure Laterality Date   CHOLECYSTECTOMY N/A 07/13/2014   Procedure: LAPAROSCOPIC CHOLECYSTECTOMY WITH INTRAOPERATIVE CHOLANGIOGRAM;  Surgeon: Rolm Bookbinder, MD;  Location: [facility];  Service: General;  Laterality: N/A;   egg retrieval  2008; 2011; 2012   multiple IVF cycles   ENDOMETRIAL BIOPSY     SIMPLE MASTECTOMY WITH AXILLARY SENTINEL NODE BIOPSY Bilateral 07/15/2019   Procedure: RIGHT RISK REDUCING MASTECTOMY AND LEFT MASTECTOMY  WITH LEFT AXILLARY SENTINEL NODE BIOPSY;  Surgeon: Rolm Bookbinder, MD;  Location: [place];  Service: General;  Laterality: Bilateral;  PEC BLOCK   WISDOM TOOTH EXTRACTION      Social History   Socioeconomic History   Marital status: Married    Spouse name: Not on file   Number of children: 0   Years of education: Not on file   Highest education level: Not on file  Occupational History   Occupation: internal medicine physician/ hospitalist    Employer: [employer]  Tobacco Use  Smoking status: Never   Smokeless tobacco: Never  Vaping Use   Vaping Use: Never used  Substance and Sexual Activity   Alcohol use: No    Alcohol/week: 0.0 standard drinks of alcohol   Drug use: No   Sexual activity: Yes    Partners: Male    Birth control/protection: Other-see comments    Comment: infertility  Other Topics Concern   Not on file  Social History Narrative   Married   Social Determinants of Health   Financial Resource Strain: Not on file  Food Insecurity: Not on file  Transportation Needs: Not on file  Physical Activity: Not on file  Stress: Not on file  Social Connections: Not on file  Intimate Partner Violence: Not on file     Allergies  Allergen  Reactions   Lactose Intolerance (Gi) Diarrhea    Stomach pain   Lifitegrast Other (See Comments)    corneal lesion   Latex Rash   Systane Balance [Propylene Glycol] Rash   Tape Rash   Wound Dressing Adhesive Rash     Outpatient Medications Prior to Visit  Medication Sig Dispense Refill   clobetasol (TEMOVATE) 0.05 % external solution Apply topically 2 (two) times daily 50 mL 1   cycloSPORINE, PF, (CEQUA) 0.09 % SOLN Place 1 drop into both eyes 2 (two) times daily 180 each 4   hydroxychloroquine (PLAQUENIL) 200 MG tablet TAKE 2 TABLETS BY MOUTH EVERY OTHER DAY ALTERNATING WITH 1 TABLET EVERY OTHER DAY. 135 tablet 3   rosuvastatin (CRESTOR) 20 MG tablet Take 1 tablet (20 mg total) by mouth 2 (two) times a week. 90 tablet 3   UNABLE TO FIND Place 1 drop into both eyes 6 (six) times daily. Platelet rich growth factor eye drops     VITAMIN D PO Take by mouth. Takes a few times a month     No facility-administered medications prior to visit.    Review of Systems  Constitutional:  Negative for chills, fever, malaise/fatigue and weight loss.  HENT:  Negative for hearing loss, sore throat and tinnitus.   Eyes:  Negative for blurred vision and double vision.  Respiratory:  Negative for cough, hemoptysis, sputum production, shortness of breath, wheezing and stridor.   Cardiovascular:  Negative for chest pain, palpitations, orthopnea, leg swelling and PND.  Gastrointestinal:  Negative for abdominal pain, constipation, diarrhea, heartburn, nausea and vomiting.  Genitourinary:  Negative for dysuria, hematuria and urgency.  Musculoskeletal:  Negative for joint pain and myalgias.  Skin:  Negative for itching and rash.  Neurological:  Negative for dizziness, tingling, weakness and headaches.  Endo/Heme/Allergies:  Negative for environmental allergies. Does not bruise/bleed easily.  Psychiatric/Behavioral:  Negative for depression. The patient is not nervous/anxious and does not have insomnia.    All other systems reviewed and are negative.    Objective:  Physical Exam Vitals reviewed.  Constitutional:      General: She is not in acute distress.    Appearance: She is well-developed.  HENT:     Head: Normocephalic and atraumatic.  Eyes:     General: No scleral icterus.    Conjunctiva/sclera: Conjunctivae normal.     Pupils: Pupils are equal, round, and reactive to light.  Neck:     Vascular: No JVD.     Trachea: No tracheal deviation.  Cardiovascular:     Rate and Rhythm: Normal rate and regular rhythm.     Heart sounds: Normal heart sounds. No murmur heard. Pulmonary:     Effort:  Pulmonary effort is normal. No tachypnea, accessory muscle usage or respiratory distress.     Breath sounds: No stridor. No wheezing, rhonchi or rales.  Abdominal:     General: There is no distension.     Palpations: Abdomen is soft.     Tenderness: There is no abdominal tenderness.  Musculoskeletal:        General: No tenderness.     Cervical back: Neck supple.  Lymphadenopathy:     Cervical: No cervical adenopathy.  Skin:    General: Skin is warm and dry.     Capillary Refill: Capillary refill takes less than 2 seconds.     Findings: No rash.  Neurological:     Mental Status: She is alert and oriented to person, place, and time.  Psychiatric:        Behavior: Behavior normal.      Vitals:   11/30/21 0840  BP: 110/80  Pulse: 75  SpO2: 99%  Weight: 115 lb 12.8 oz (52.5 kg)  Height: '5\' 3"'$  (1.6 m)   99% on RA BMI Readings from Last 3 Encounters:  11/30/21 20.51 kg/m  11/20/21 20.55 kg/m  10/05/21 21.08 kg/m   Wt Readings from Last 3 Encounters:  11/30/21 115 lb 12.8 oz (52.5 kg)  11/20/21 116 lb (52.6 kg)  10/05/21 119 lb (54 kg)     CBC    Component Value Date/Time   WBC 3.0 (L) 07/14/2019 0935   RBC 3.94 07/14/2019 0935   HGB 12.5 07/14/2019 0935   HGB 12.8 09/12/2016 0941   HCT 38.8 07/14/2019 0935   HCT 39.1 09/12/2016 0941   PLT 131 (L) 07/14/2019  0935   PLT 94 (LL) 09/12/2016 0941   MCV 98.5 07/14/2019 0935   MCV 93 09/12/2016 0941   MCH 31.7 07/14/2019 0935   MCHC 32.2 07/14/2019 0935   RDW 12.3 07/14/2019 0935   RDW 13.7 09/12/2016 0941   LYMPHSABS 0.5 (L) 04/06/2019 0940   LYMPHSABS 1.7 09/12/2016 0941   MONOABS 0.4 04/06/2019 0940   EOSABS 0.1 04/06/2019 0940   EOSABS 0.1 09/12/2016 0941   BASOSABS 0.0 04/06/2019 0940   BASOSABS 0.0 09/12/2016 0941      Chest Imaging: 01/01/2020 CT chest: Stable 67 mm groundglass opacity right lower lobe. The patient's images have been independently reviewed by me.    October 2023 CT chest: 7 mm groundglass lesion in the right lower lobe still persistent since 2021. The patient's images have been independently reviewed by me.    Pulmonary Functions Testing Results:     No data to display          FeNO:   Pathology:   Echocardiogram:   Heart Catheterization:     Assessment & Plan:     ICD-10-CM   1. Lung nodule  R91.1 Ambulatory referral to Cardiothoracic Surgery    2. Ground glass opacity present on imaging of lung  R91.8     3. S/P bilateral mastectomy  Z90.13     4. Asian ancestry   Z13.9       Discussion:  This is a 51 year old female, Asian descent, history of LCIS of the left breast status post bilateral mastectomy.  Has a right lower lobe 7 mm groundglass opacity that is persistent since 2021.  Asian female does have a high risk for the development of lung cancer in non-smokers.  Higher rates of mutations.  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  Plan: The lesion is still persistent. I think referral to thoracic  surgery for second opinion to discuss this with Dr. Kipp Brood is reasonable. I think potentially she would be a good case for a single anesthetic event where we bring patient in for biopsy if we can for malignancy she would then undergo resection. She is interested in at least having this discussion with thoracic surgery. We appreciate the  multidisciplinary approach.    Current Outpatient Medications:    clobetasol (TEMOVATE) 0.05 % external solution, Apply topically 2 (two) times daily, Disp: 50 mL, Rfl: 1   cycloSPORINE, PF, (CEQUA) 0.09 % SOLN, Place 1 drop into both eyes 2 (two) times daily, Disp: 180 each, Rfl: 4   hydroxychloroquine (PLAQUENIL) 200 MG tablet, TAKE 2 TABLETS BY MOUTH EVERY OTHER DAY ALTERNATING WITH 1 TABLET EVERY OTHER DAY., Disp: 135 tablet, Rfl: 3   rosuvastatin (CRESTOR) 20 MG tablet, Take 1 tablet (20 mg total) by mouth 2 (two) times a week., Disp: 90 tablet, Rfl: 3   UNABLE TO FIND, Place 1 drop into both eyes 6 (six) times daily. Platelet rich growth factor eye drops, Disp: , Rfl:    VITAMIN D PO, Take by mouth. Takes a few times a month, Disp: , Rfl:    I spent 42 minutes dedicated to the care of this patient on the date of this encounter to include pre-visit review of records, face-to-face time with the patient discussing conditions above, post visit ordering of testing, clinical documentation with the electronic health record, making appropriate referrals as documented, and communicating necessary findings to members of the patients care team.    Garner Nash, DO [facility] 11/30/2021 8:45 AM

## 2021-11-30 NOTE — Patient Instructions (Addendum)
Thank you for visiting Dr. Valeta Harms at [facility]. Today we recommend the following:  Orders Placed This Encounter  Procedures   Ambulatory referral to Cardiothoracic Surgery    Return if symptoms worsen or fail to improve.    Please do your part to reduce the spread of COVID-19.

## 2021-12-04 ENCOUNTER — Other Ambulatory Visit: Payer: Self-pay

## 2021-12-04 DIAGNOSIS — E785 Hyperlipidemia, unspecified: Secondary | ICD-10-CM | POA: Diagnosis not present

## 2021-12-04 DIAGNOSIS — M3501 Sicca syndrome with keratoconjunctivitis: Secondary | ICD-10-CM | POA: Diagnosis not present

## 2021-12-04 DIAGNOSIS — H018 Other specified inflammations of eyelid: Secondary | ICD-10-CM | POA: Diagnosis not present

## 2021-12-04 DIAGNOSIS — Z79899 Other long term (current) drug therapy: Secondary | ICD-10-CM | POA: Diagnosis not present

## 2021-12-04 DIAGNOSIS — I700 Atherosclerosis of aorta: Secondary | ICD-10-CM | POA: Diagnosis not present

## 2021-12-05 LAB — HEPATIC FUNCTION PANEL
ALT: 19 IU/L (ref 0–32)
AST: 30 IU/L (ref 0–40)
Albumin: 5.1 g/dL — ABNORMAL HIGH (ref 3.8–4.9)
Alkaline Phosphatase: 73 IU/L (ref 44–121)
Bilirubin Total: 0.6 mg/dL (ref 0.0–1.2)
Bilirubin, Direct: 0.22 mg/dL (ref 0.00–0.40)
Total Protein: 7.5 g/dL (ref 6.0–8.5)

## 2021-12-05 LAB — LIPASE: Lipase: 30 U/L (ref 14–72)

## 2021-12-05 LAB — CK: Total CK: 149 U/L (ref 32–182)

## 2021-12-08 ENCOUNTER — Encounter (HOSPITAL_BASED_OUTPATIENT_CLINIC_OR_DEPARTMENT_OTHER): Payer: Self-pay | Admitting: Obstetrics & Gynecology

## 2021-12-08 ENCOUNTER — Encounter: Payer: Self-pay | Admitting: Internal Medicine

## 2021-12-08 ENCOUNTER — Ambulatory Visit: Payer: 59 | Admitting: Thoracic Surgery (Cardiothoracic Vascular Surgery)

## 2021-12-08 DIAGNOSIS — N924 Excessive bleeding in the premenopausal period: Secondary | ICD-10-CM

## 2021-12-08 LAB — NMR, LIPOPROFILE
Cholesterol, Total: 118 mg/dL (ref 100–199)
HDL Particle Number: 34.1 umol/L (ref >=30.5)
HDL-C: 69 mg/dL (ref >39)
LDL Particle Number: 326 nmol/L (ref <1000)
LDL Size: 20.2 nm — ABNORMAL LOW (ref >20.5)
LDL-C (NIH Calc): 39 mg/dL (ref 0–99)
LP-IR Score: <25 (ref <=45)
Small LDL Particle Number: 125 nmol/L (ref <=527)
Triglycerides: 35 mg/dL (ref 0–149)

## 2021-12-09 IMAGING — CT CT CHEST W/ CM
3 series · 14 of 32 positions shown, 18 images · IV contrast (APPLIED)
Comparison: None.

CLINICAL DATA: Newly diagnosed left breast carcinoma. Staging.

EXAM:
CT CHEST, ABDOMEN, AND PELVIS WITH CONTRAST
TECHNIQUE: Multidetector CT imaging of the chest, abdomen and pelvis was
performed following the standard protocol during bolus
administration of intravenous contrast.
CONTRAST:  100mL UYRGUI-QAA IOPAMIDOL (UYRGUI-QAA) INJECTION 61%

[Series 2: chest/abd/pelvis w/cm · axial · 0.70mm/px · z∈[+620,+1155]mm · 8 of 129 slices shown]
[im 11/129  soft-tissue]
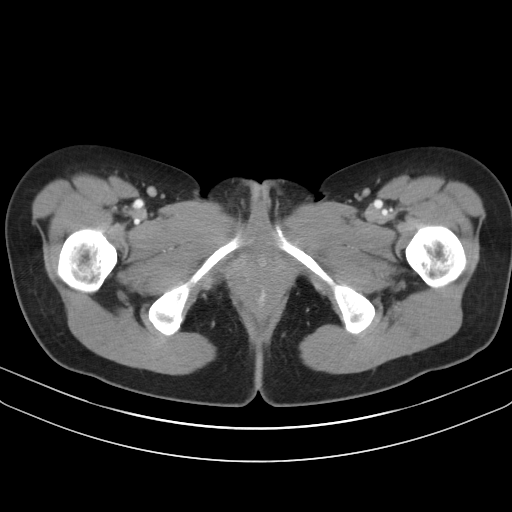
[im 33/129  soft-tissue]
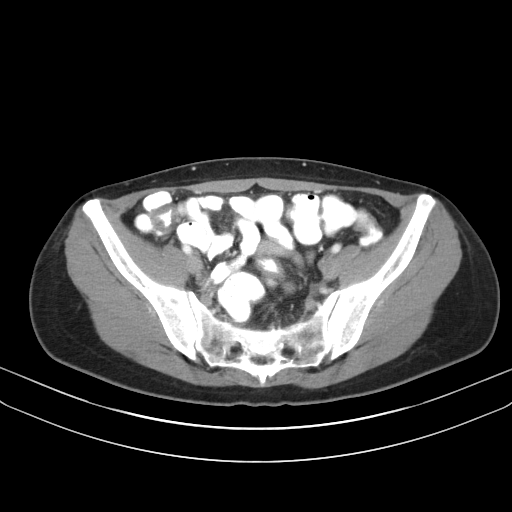
[im 43/129  soft-tissue]
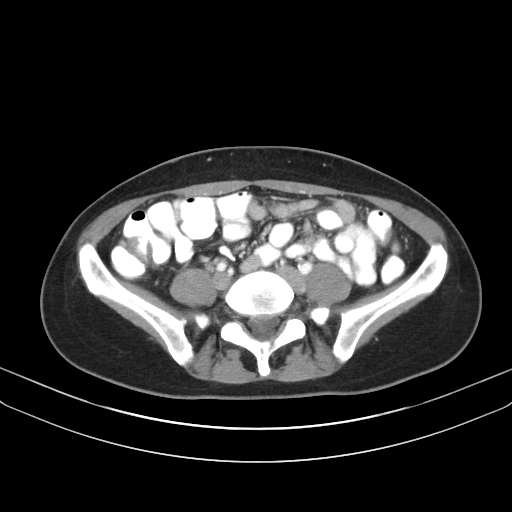
[im 54/129  soft-tissue]
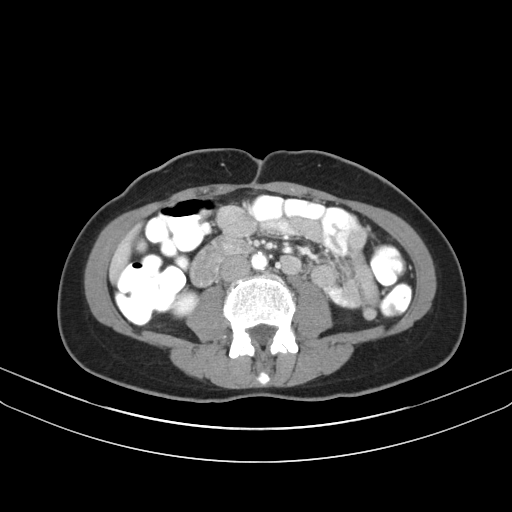
[im 75/129  soft-tissue]
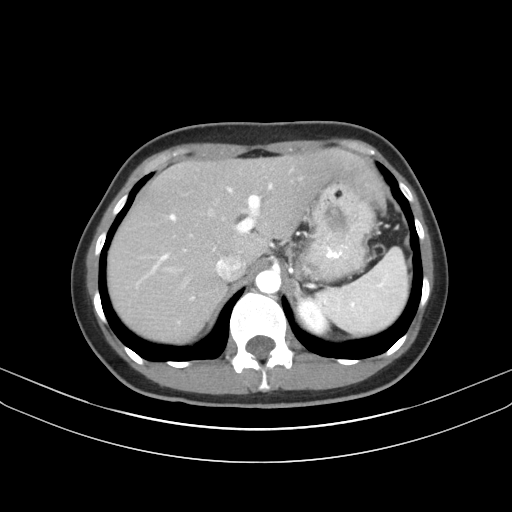
[im 86/129  soft-tissue]
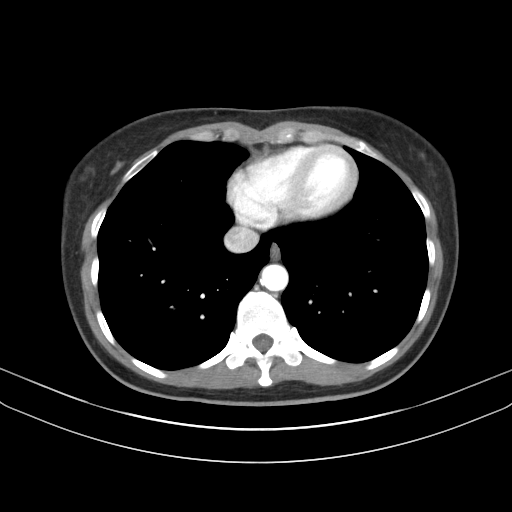
[im 97/129  soft-tissue]
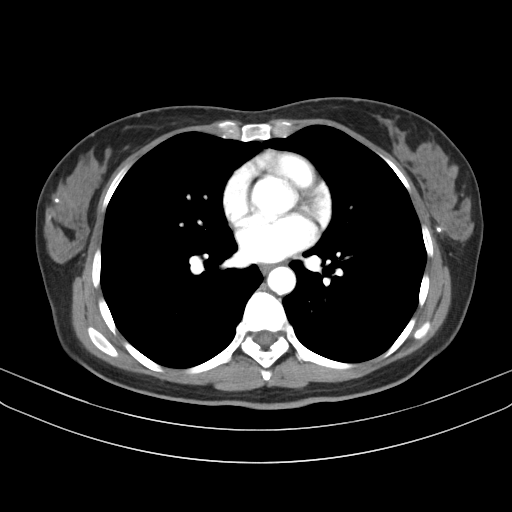
[im 118/129  soft-tissue]
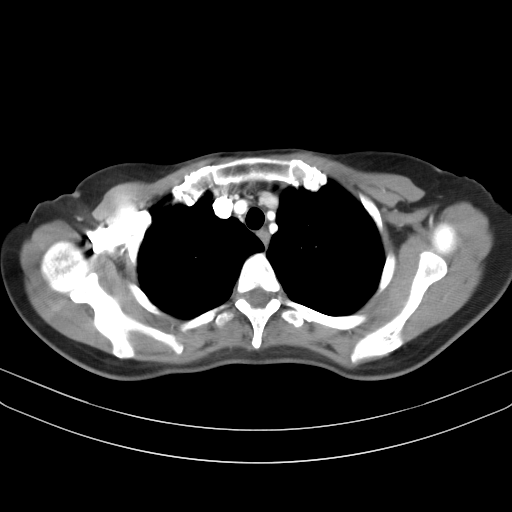

[Series 6: lung · axial · 0.62mm/px · z∈[+914,+998]mm · 3 of 159 slices shown]
[im 11/159  bone]
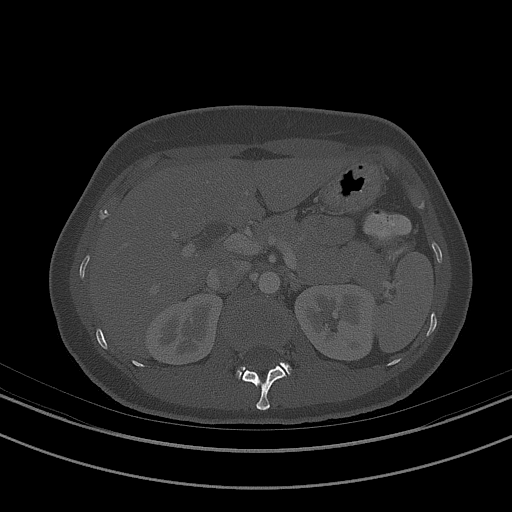
[im 32/159  bone]
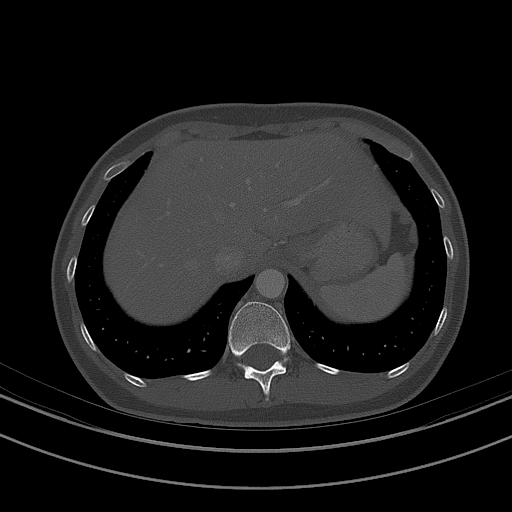
[im 53/159  bone]
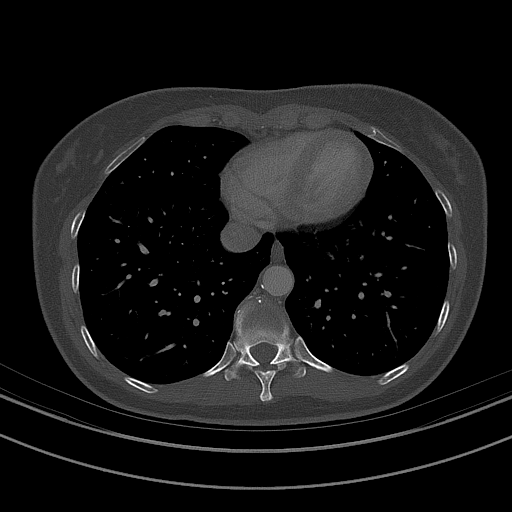

[Series 7: renal delay · axial · delayed · 0.70mm/px · z∈[+807,+967]mm · 3 of 33 slices shown, 7 images]
[im 1/33  soft-tissue]
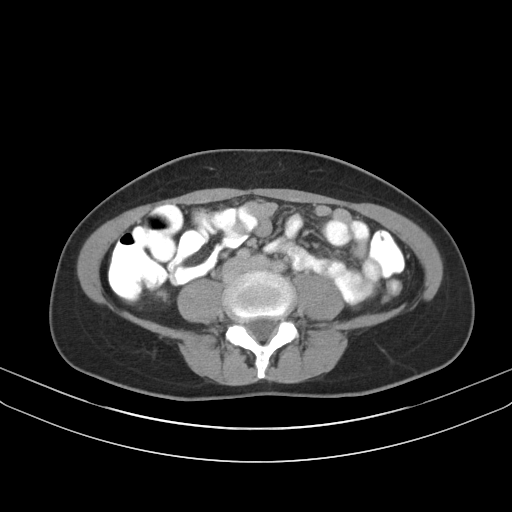
[im 1/33  lung]
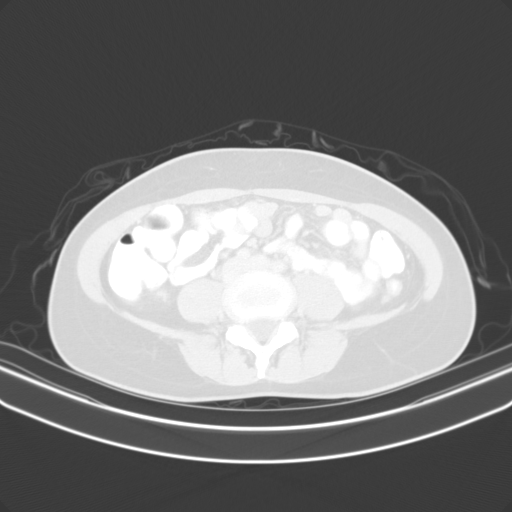
[im 1/33  bone]
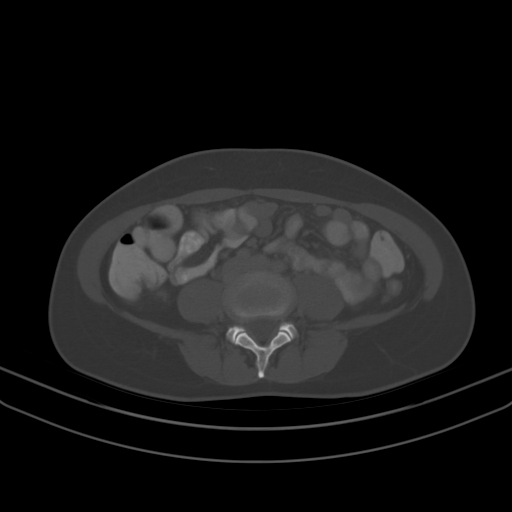
[im 17/33  soft-tissue]
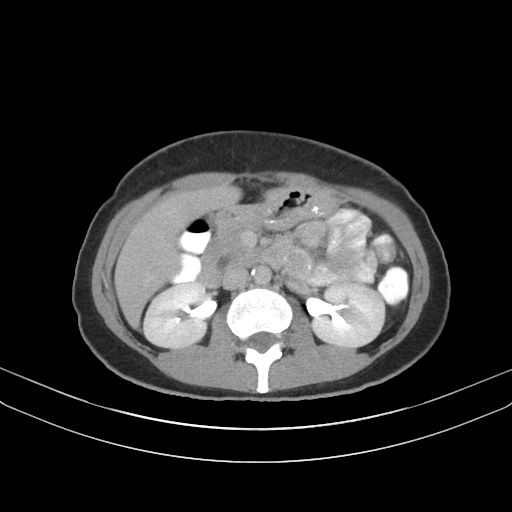
[im 17/33  lung]
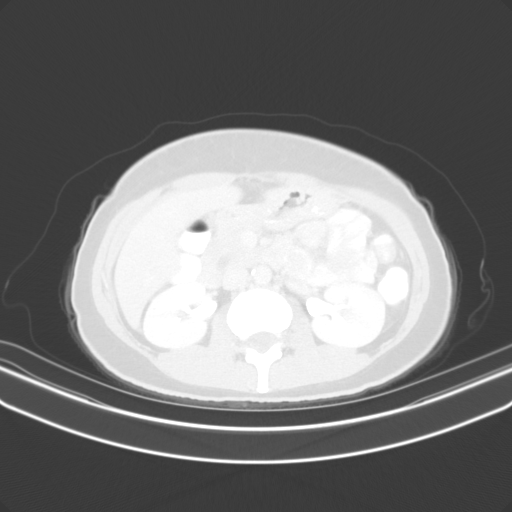
[im 33/33  soft-tissue]
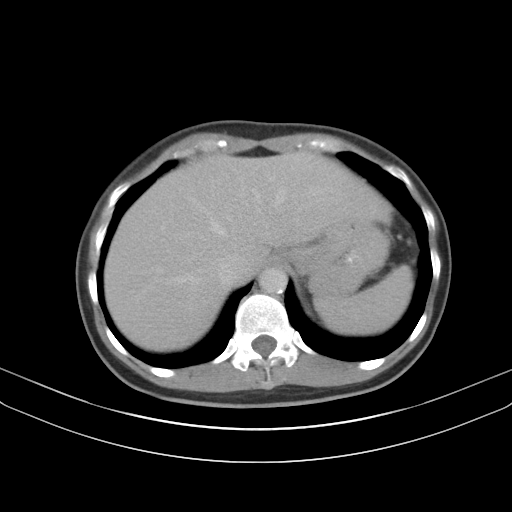
[im 33/33  lung]
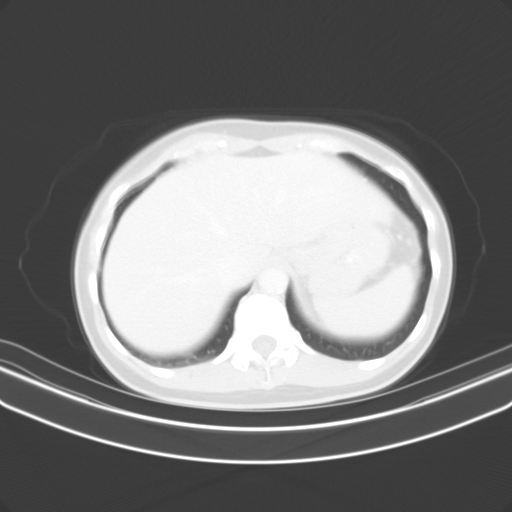

[14 of 32 positions shown; findings below may reference images not displayed]

FINDINGS: CT CHEST FINDINGS

Cardiovascular: No acute findings.

Mediastinum/Lymph Nodes: No masses or pathologically enlarged lymph
nodes identified. No pathologically enlarged lymph nodes seen in the
axillary or subpectoral regions.

Lungs/Pleura: A 7 mm pure ground-glass nodule is seen in the right
lower lobe on image 87/6. No other suspicious pulmonary nodules or
masses identified. No evidence of pulmonary infiltrate or pleural
effusion.

Musculoskeletal:  No suspicious bone lesions identified.

CT ABDOMEN AND PELVIS FINDINGS

Hepatobiliary: No masses identified. Prior cholecystectomy. No
evidence of biliary obstruction.

Pancreas:  No mass or inflammatory changes.

Spleen:  Within normal limits in size and appearance.

Adrenals/Urinary tract: Normal adrenal glands. No renal masses or
hydronephrosis.

Stomach/Bowel: No evidence of obstruction, inflammatory process, or
abnormal fluid collections.

Vascular/Lymphatic: No pathologically enlarged lymph nodes
identified. No abdominal aortic aneurysm. Aortic atherosclerosis
noted.

Reproductive: Multiple uterine fibroids are seen, largest measuring
approximately 5 cm. No adnexal masses identified. No evidence of
free fluid.

Other:  None.

Musculoskeletal:  No suspicious bone lesions identified.
IMPRESSION: 1. No evidence of metastatic disease within the chest, abdomen, or
pelvis.
2. Multiple uterine fibroids, largest measuring approximately 5 cm.
3. 7 mm ground-glass pulmonary nodule in right lower lobe. Initial
follow-up with CT at 6-12 months is recommended to confirm
persistence. If persistent, repeat CT is recommended every 2 years
until 5 years of stability has been established. This recommendation
follows the consensus statement: Guidelines for Management of
Incidental Pulmonary Nodules Detected on CT Images: From the

Aortic Atherosclerosis (9VDWY-ZOB.B).

## 2021-12-09 IMAGING — CT CT ABD-PELV W/ CM
3 series · 14 of 32 positions shown, 18 images · IV contrast (APPLIED)
Comparison: None.

CLINICAL DATA: Newly diagnosed left breast carcinoma. Staging.

EXAM:
CT CHEST, ABDOMEN, AND PELVIS WITH CONTRAST
TECHNIQUE: Multidetector CT imaging of the chest, abdomen and pelvis was
performed following the standard protocol during bolus
administration of intravenous contrast.
CONTRAST:  100mL UYRGUI-QAA IOPAMIDOL (UYRGUI-QAA) INJECTION 61%

[Series 2: chest/abd/pelvis w/cm · axial · 0.70mm/px · z∈[+620,+1155]mm · 8 of 129 slices shown]
[im 11/129  soft-tissue]
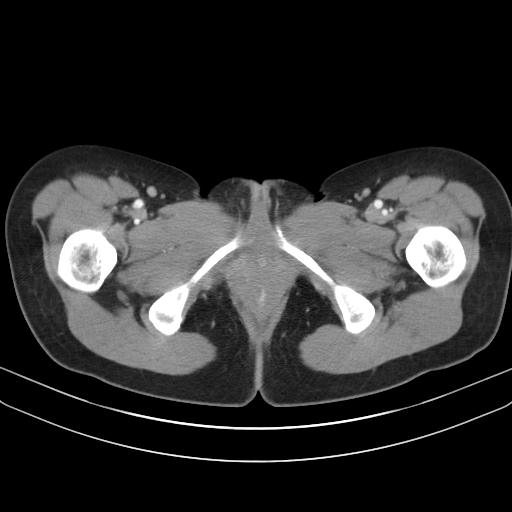
[im 33/129  soft-tissue]
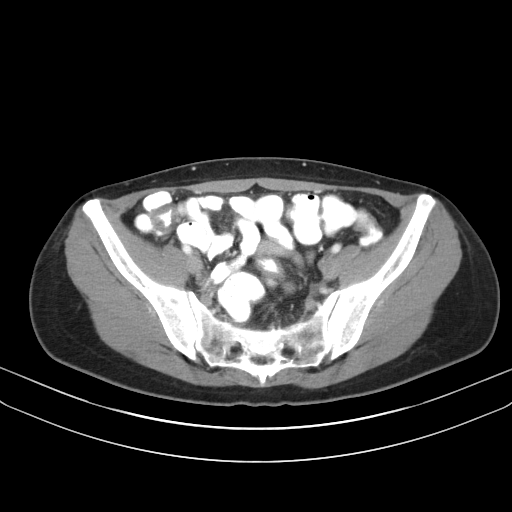
[im 43/129  soft-tissue]
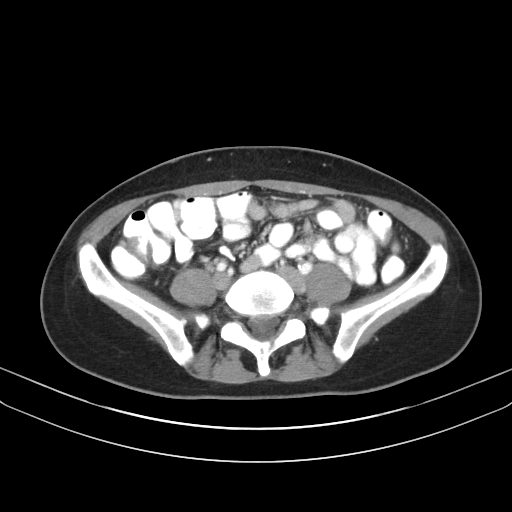
[im 54/129  soft-tissue]
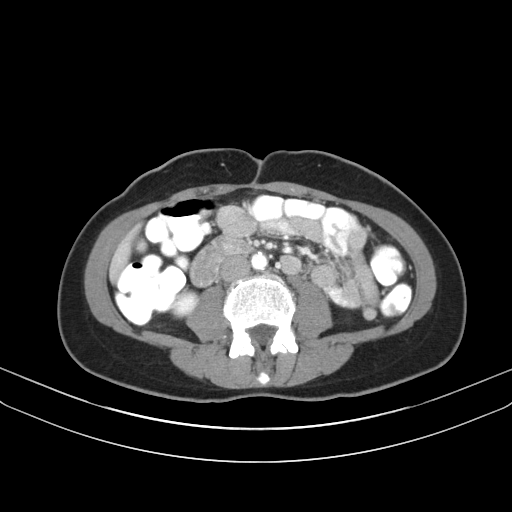
[im 75/129  soft-tissue]
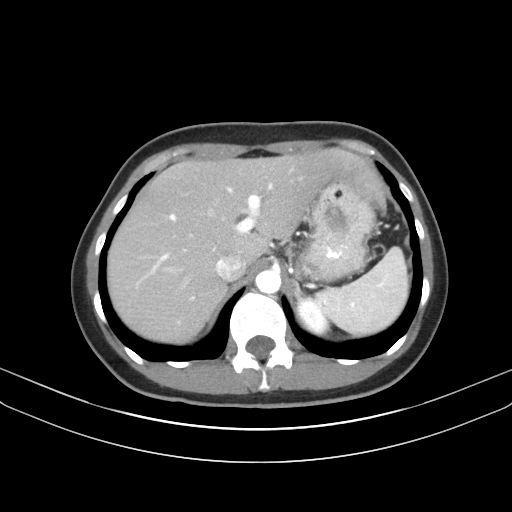
[im 86/129  soft-tissue]
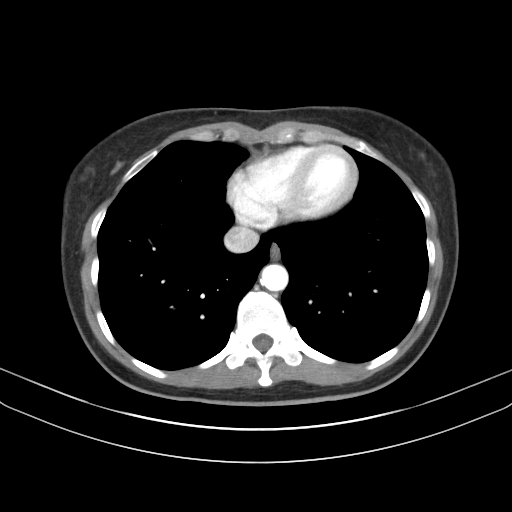
[im 97/129  soft-tissue]
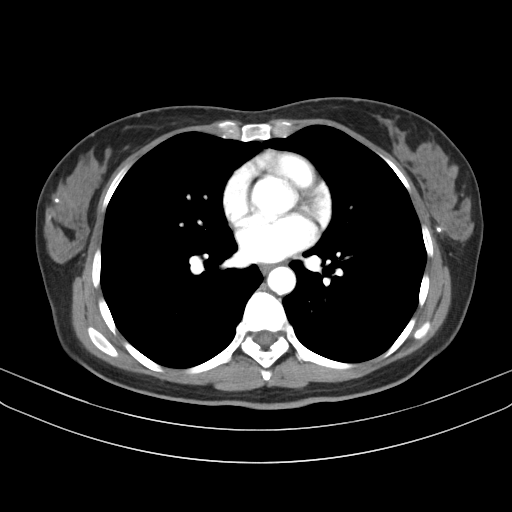
[im 118/129  soft-tissue]
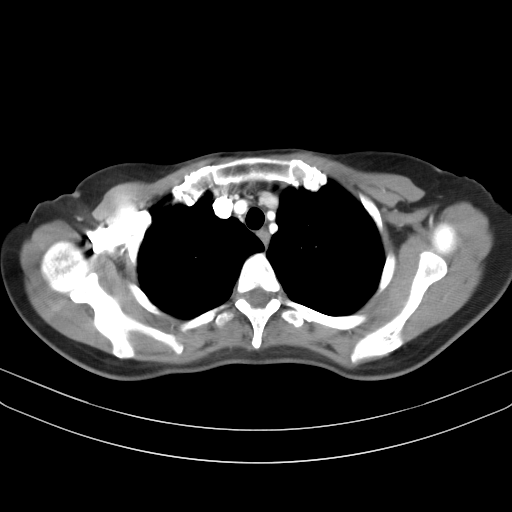

[Series 6: lung · axial · 0.62mm/px · z∈[+914,+998]mm · 3 of 159 slices shown]
[im 11/159  bone]
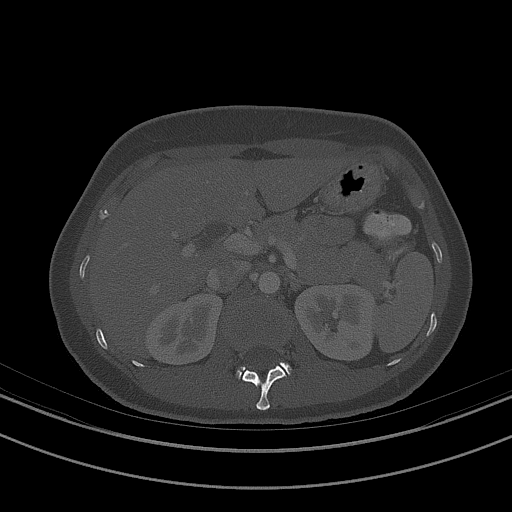
[im 32/159  bone]
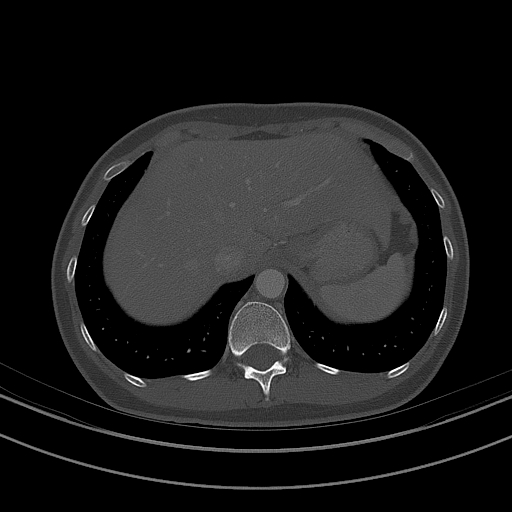
[im 53/159  bone]
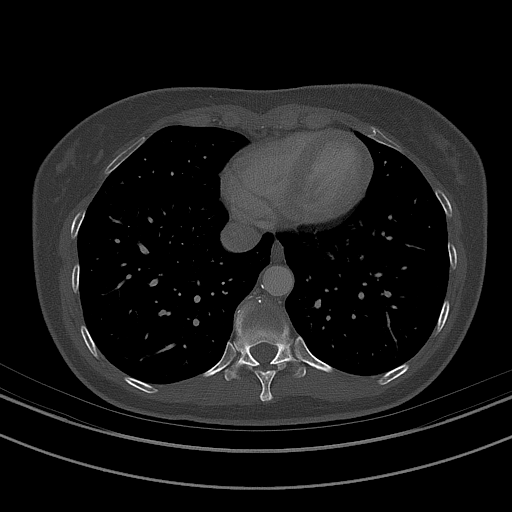

[Series 7: renal delay · axial · delayed · 0.70mm/px · z∈[+807,+967]mm · 3 of 33 slices shown, 7 images]
[im 1/33  soft-tissue]
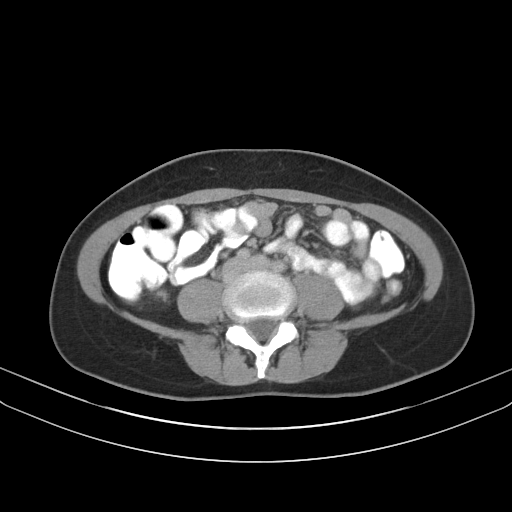
[im 1/33  lung]
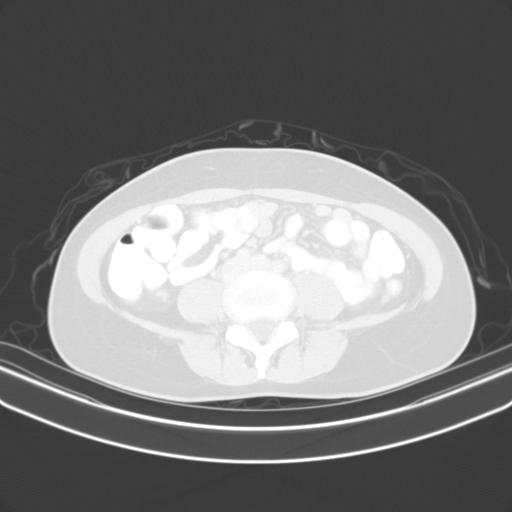
[im 1/33  bone]
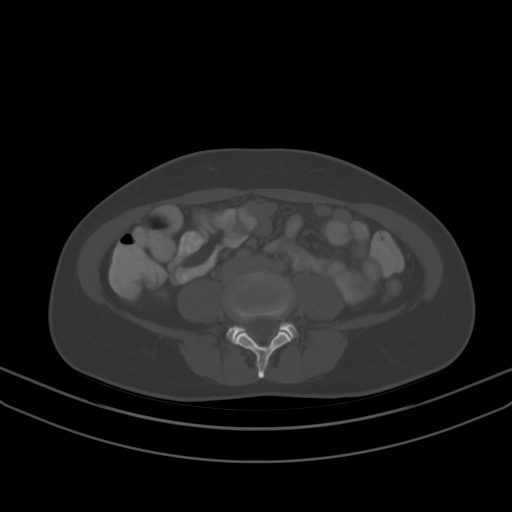
[im 17/33  soft-tissue]
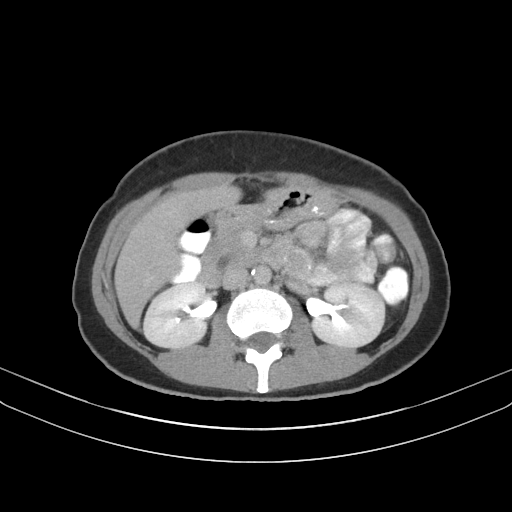
[im 17/33  lung]
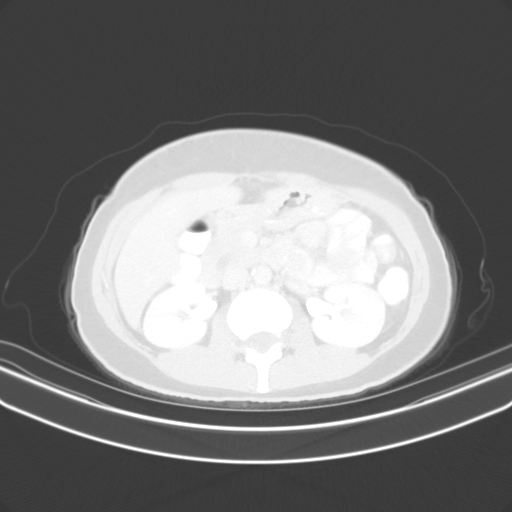
[im 33/33  soft-tissue]
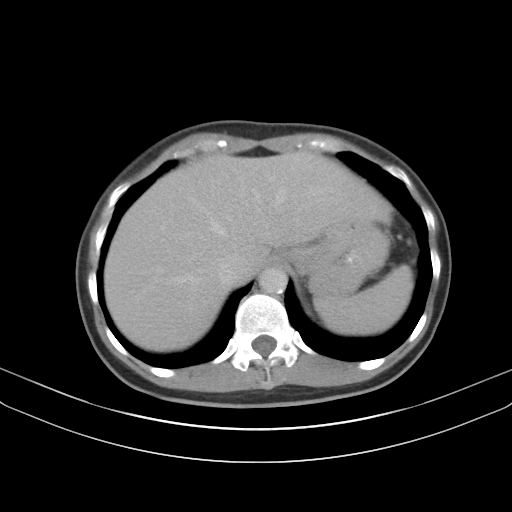
[im 33/33  lung]
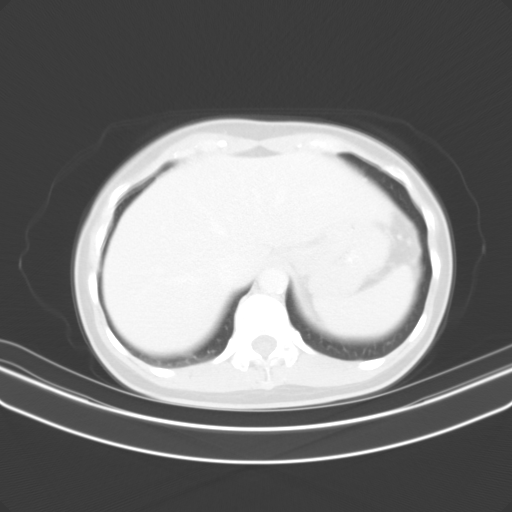

[14 of 32 positions shown; findings below may reference images not displayed]

FINDINGS: CT CHEST FINDINGS

Cardiovascular: No acute findings.

Mediastinum/Lymph Nodes: No masses or pathologically enlarged lymph
nodes identified. No pathologically enlarged lymph nodes seen in the
axillary or subpectoral regions.

Lungs/Pleura: A 7 mm pure ground-glass nodule is seen in the right
lower lobe on image 87/6. No other suspicious pulmonary nodules or
masses identified. No evidence of pulmonary infiltrate or pleural
effusion.

Musculoskeletal:  No suspicious bone lesions identified.

CT ABDOMEN AND PELVIS FINDINGS

Hepatobiliary: No masses identified. Prior cholecystectomy. No
evidence of biliary obstruction.

Pancreas:  No mass or inflammatory changes.

Spleen:  Within normal limits in size and appearance.

Adrenals/Urinary tract: Normal adrenal glands. No renal masses or
hydronephrosis.

Stomach/Bowel: No evidence of obstruction, inflammatory process, or
abnormal fluid collections.

Vascular/Lymphatic: No pathologically enlarged lymph nodes
identified. No abdominal aortic aneurysm. Aortic atherosclerosis
noted.

Reproductive: Multiple uterine fibroids are seen, largest measuring
approximately 5 cm. No adnexal masses identified. No evidence of
free fluid.

Other:  None.

Musculoskeletal:  No suspicious bone lesions identified.
IMPRESSION: 1. No evidence of metastatic disease within the chest, abdomen, or
pelvis.
2. Multiple uterine fibroids, largest measuring approximately 5 cm.
3. 7 mm ground-glass pulmonary nodule in right lower lobe. Initial
follow-up with CT at 6-12 months is recommended to confirm
persistence. If persistent, repeat CT is recommended every 2 years
until 5 years of stability has been established. This recommendation
follows the consensus statement: Guidelines for Management of
Incidental Pulmonary Nodules Detected on CT Images: From the

Aortic Atherosclerosis (9VDWY-ZOB.B).

## 2021-12-11 ENCOUNTER — Encounter (HOSPITAL_BASED_OUTPATIENT_CLINIC_OR_DEPARTMENT_OTHER): Payer: Self-pay | Admitting: Obstetrics & Gynecology

## 2021-12-11 ENCOUNTER — Other Ambulatory Visit (HOSPITAL_BASED_OUTPATIENT_CLINIC_OR_DEPARTMENT_OTHER): Payer: Self-pay

## 2021-12-11 ENCOUNTER — Other Ambulatory Visit (HOSPITAL_COMMUNITY)
Admission: RE | Admit: 2021-12-11 | Discharge: 2021-12-11 | Disposition: A | Discharge status: Home / Self Care | Payer: 59 | Source: Ambulatory Visit

## 2021-12-11 ENCOUNTER — Ambulatory Visit (INDEPENDENT_AMBULATORY_CARE_PROVIDER_SITE_OTHER): Payer: 59 | Admitting: Obstetrics & Gynecology

## 2021-12-11 ENCOUNTER — Other Ambulatory Visit (HOSPITAL_COMMUNITY): Payer: Self-pay

## 2021-12-11 ENCOUNTER — Ambulatory Visit (HOSPITAL_BASED_OUTPATIENT_CLINIC_OR_DEPARTMENT_OTHER)
Admission: RE | Admit: 2021-12-11 | Discharge: 2021-12-11 | Disposition: A | Discharge status: Home / Self Care | Payer: 59 | Source: Ambulatory Visit | Attending: Obstetrics & Gynecology | Admitting: Obstetrics & Gynecology

## 2021-12-11 VITALS — BP 132/71 | HR 73 | Ht 63.0 in | Wt 115.4 lb

## 2021-12-11 DIAGNOSIS — N924 Excessive bleeding in the premenopausal period: Secondary | ICD-10-CM | POA: Diagnosis not present

## 2021-12-11 DIAGNOSIS — N920 Excessive and frequent menstruation with regular cycle: Secondary | ICD-10-CM | POA: Diagnosis not present

## 2021-12-11 DIAGNOSIS — N921 Excessive and frequent menstruation with irregular cycle: Secondary | ICD-10-CM | POA: Insufficient documentation

## 2021-12-11 MED ORDER — NORETHINDRONE ACETATE 5 MG PO TABS
ORAL_TABLET | ORAL | 1 refills | Status: DC
  Filled 2021-12-11: qty 40, 20d supply, fill #0

## 2021-12-11 MED ORDER — ROSUVASTATIN CALCIUM 20 MG PO TABS
20.0000 mg | ORAL_TABLET | Freq: Every day | ORAL | 3 refills | Status: DC
Start: 2021-12-11 — End: 2023-04-15
  Filled 2021-12-11 (×2): qty 90, 90d supply, fill #0
  Filled 2022-04-09: qty 90, 90d supply, fill #1
  Filled 2022-07-30: qty 90, 90d supply, fill #2
  Filled 2022-10-26: qty 90, 90d supply, fill #3
  Filled 2022-10-26: qty 90, 90d supply, fill #0

## 2021-12-12 LAB — CBC
Hematocrit: 33.3 % — ABNORMAL LOW (ref 34.0–46.6)
Hemoglobin: 11.1 g/dL (ref 11.1–15.9)
MCH: 31.3 pg (ref 26.6–33.0)
MCHC: 33.3 g/dL (ref 31.5–35.7)
MCV: 94 fL (ref 79–97)
Platelets: 92 10*3/uL — CL (ref 150–450)
RBC: 3.55 x10E6/uL — ABNORMAL LOW (ref 3.77–5.28)
RDW: 11.8 % (ref 11.7–15.4)
WBC: 3.5 10*3/uL (ref 3.4–10.8)

## 2021-12-12 LAB — COMPREHENSIVE METABOLIC PANEL
ALT: 24 IU/L (ref 0–32)
AST: 29 IU/L (ref 0–40)
Albumin/Globulin Ratio: 1.8 (ref 1.2–2.2)
Albumin: 4.4 g/dL (ref 3.8–4.9)
Alkaline Phosphatase: 71 IU/L (ref 44–121)
BUN/Creatinine Ratio: 21 (ref 9–23)
BUN: 12 mg/dL (ref 6–24)
Bilirubin Total: 0.4 mg/dL (ref 0.0–1.2)
CO2: 24 mmol/L (ref 20–29)
Calcium: 9.3 mg/dL (ref 8.7–10.2)
Chloride: 104 mmol/L (ref 96–106)
Creatinine, Ser: 0.56 mg/dL — ABNORMAL LOW (ref 0.57–1.00)
Globulin, Total: 2.5 g/dL (ref 1.5–4.5)
Glucose: 90 mg/dL (ref 70–99)
Potassium: 4.1 mmol/L (ref 3.5–5.2)
Sodium: 142 mmol/L (ref 134–144)
Total Protein: 6.9 g/dL (ref 6.0–8.5)
eGFR: 110 mL/min/{1.73_m2} (ref >59)

## 2021-12-12 LAB — FOLLICLE STIMULATING HORMONE: FSH: 16.9 m[IU]/mL

## 2021-12-13 NOTE — Progress Notes (Signed)
GYNECOLOGY  VISIT  CC:   heavy bleeding  HPI: 51 y.o. G16P0010 Married [place] female here for complaint of this cycle being around three weeks, very heavy with passing clots.  Has worried about amount of bleeding.  Did have ultrasound with radiology prior to coming here today.  Sent my chart message and pt was worked into the schedule as well as having ultrasound ordered.  This is not officially read but I reviewed imagines and there are multiple fibroids.  Small ovary noted but both not seen.  No free fluid.  Pt reassured.  Endometrium is 80m.    Treatment options and evaluation discussed.  Will proceed with endometrial biopsy today.   Past Medical History:  Diagnosis Date   ANA positive    ?Sjogrens   Breast neoplasm, Tis (LCIS), left    Cholecystitis    Cholelithiasis    Dry eye    Fibroid    Gall stones    GERD (gastroesophageal reflux disease)    Hypothyroidism    Takes synthroid   Infertility, female    Keratitis 2020   Lactose intolerance    Leukopenia    Sjogren's disease (HCC)    Thrombocytopenia (HCC)    Urticaria    episodes since childhood    Vitamin D deficiency disease    Takes Vit D   Weight loss     MEDS:   Current Outpatient Medications on File Prior to Visit  Medication Sig Dispense Refill   clobetasol (TEMOVATE) 0.05 % external solution Apply topically 2 (two) times daily 50 mL 1   cycloSPORINE, PF, (CEQUA) 0.09 % SOLN Place 1 drop into both eyes 2 (two) times daily 180 each 4   hydroxychloroquine (PLAQUENIL) 200 MG tablet TAKE 2 TABLETS BY MOUTH EVERY OTHER DAY ALTERNATING WITH 1 TABLET EVERY OTHER DAY. 135 tablet 3   rosuvastatin (CRESTOR) 20 MG tablet Take 1 tablet (20 mg total) by mouth daily. 90 tablet 3   UNABLE TO FIND Place 1 drop into both eyes 6 (six) times daily. Platelet rich growth factor eye drops     VITAMIN D PO Take by mouth. Takes a few times a month     No current facility-administered medications on file prior to visit.     ALLERGIES: Lactose intolerance (gi), Lifitegrast, Latex, Systane balance [propylene glycol], Tape, and Wound dressing adhesive  SH:  married, non smoker  Review of Systems  Constitutional: Negative.   Genitourinary:        Vaginal bleeding, heavy at times with clotting    PHYSICAL EXAMINATION:    BP 132/71   Pulse 73   Ht '5\' 3"'$  (1.6 m)   Wt 115 lb 6.4 oz (52.3 kg)   LMP 11/22/2021 (Approximate)   BMI 20.44 kg/m     General appearance: alert, cooperative and appears stated age Lymph:  no inguinal LAD noted  Pelvic: External genitalia:  no lesions              Urethra:  normal appearing urethra with no masses, tenderness or lesions              Bartholins and Skenes: normal                 Vagina: normal appearing vagina with normal color and discharge, dark blood present              Cervix:  appears normal but large clot at os present  Endometrial biopsy recommended.  Discussed with patient.  Verbal and written consent obtained.   Procedure:  Speculum placed.  Cervix visualized and cleansed with betadine prep.  A single toothed tenaculum was applied to the anterior lip of the cervix.  Endometrial pipelle was advanced through the cervix into the endometrial cavity without difficulty.  Pipelle passed to 7.5cm.  Suction applied and pipelle removed with scant tissue sample obtained.  Second pass performed.  Tenculum removed.  No bleeding noted.  Patient tolerated procedure well.  Chaperone, Octaviano Batty, CMA, was present for exam.  Assessment/Plan: 1. Menorrhagia with irregular cycle - CBC, FSH already obtained - Surgical pathology( [facility] [facility]) - will start short course of norethindrone '5mg'$  bid to get bleeding to stop and then continue daily until bleeding stops.

## 2021-12-14 ENCOUNTER — Ambulatory Visit
Admission: RE | Admit: 2021-12-14 | Discharge: 2021-12-14 | Disposition: A | Discharge status: Home / Self Care | Payer: 59 | Source: Ambulatory Visit | Attending: Family Medicine | Admitting: Family Medicine

## 2021-12-14 DIAGNOSIS — M542 Cervicalgia: Secondary | ICD-10-CM

## 2021-12-14 DIAGNOSIS — M50223 Other cervical disc displacement at C6-C7 level: Secondary | ICD-10-CM | POA: Diagnosis not present

## 2021-12-14 DIAGNOSIS — M4802 Spinal stenosis, cervical region: Secondary | ICD-10-CM | POA: Diagnosis not present

## 2021-12-14 LAB — SURGICAL PATHOLOGY

## 2021-12-15 ENCOUNTER — Ambulatory Visit (INDEPENDENT_AMBULATORY_CARE_PROVIDER_SITE_OTHER): Payer: 59 | Admitting: Thoracic Surgery (Cardiothoracic Vascular Surgery)

## 2021-12-15 VITALS — BP 150/84 | HR 71 | Resp 20 | Ht 63.0 in | Wt 115.0 lb

## 2021-12-15 DIAGNOSIS — R911 Solitary pulmonary nodule: Secondary | ICD-10-CM

## 2021-12-16 NOTE — Progress Notes (Signed)
[address]       [place] [address]             [phone]                    Virlee Klumpp [facility] Medical Record #[id number] Date of Birth: 06-Sep-1970  Referring: Garner Nash, DO Primary Care: Shon Baton, MD Primary Cardiologist: None  Chief Complaint:    Chief Complaint  Patient presents with   Lung Lesion    Super D 10/9    History of Present Illness:    Sarah [name] 51 y.o. female presents to discuss results of most recent CT chest.  She has a known 6 mm groundglass opacity in the right lower lobe since her last appointment she has obtained a second opinion with thoracic surgery.  They recommended that she undergo a segmentectomy diagnostic purposes.  She is also a breast biopsy LCIS.  She has no complaints today.       Zubrod Score: At the time of surgery this patient's most appropriate activity status/level should be described as: '[x]'$     0    Normal activity, no symptoms '[]'$     1    Restricted in physical strenuous activity but ambulatory, able to do out light work '[]'$     2    Ambulatory and capable of self care, unable to do work activities, up and about               >50 % of waking hours                              '[]'$     3    Only limited self care, in bed greater than 50% of waking hours '[]'$     4    Completely disabled, no self care, confined to bed or chair '[]'$     5    Moribund   Past Medical History:  Diagnosis Date   ANA positive    ?Sjogrens   Breast neoplasm, Tis (LCIS), left    Cholecystitis    Cholelithiasis    Dry eye    Fibroid    Gall stones    GERD (gastroesophageal reflux disease)    Hypothyroidism    Takes synthroid   Infertility, female    Keratitis 2020   Lactose intolerance    Leukopenia    Sjogren's disease ([facility])    Thrombocytopenia ([facility])    Urticaria    episodes since childhood    Vitamin D deficiency disease    Takes Vit D   Weight loss     Past Surgical History:  Procedure Laterality Date    CHOLECYSTECTOMY N/A 07/13/2014   Procedure: LAPAROSCOPIC CHOLECYSTECTOMY WITH INTRAOPERATIVE CHOLANGIOGRAM;  Surgeon: Rolm Bookbinder, MD;  Location: [facility];  Service: General;  Laterality: N/A;   egg retrieval  2008; 2011; 2012   multiple IVF cycles   ENDOMETRIAL BIOPSY     SIMPLE MASTECTOMY WITH AXILLARY SENTINEL NODE BIOPSY Bilateral 07/15/2019   Procedure: RIGHT RISK REDUCING MASTECTOMY AND LEFT MASTECTOMY  WITH LEFT AXILLARY SENTINEL NODE BIOPSY;  Surgeon: Rolm Bookbinder, MD;  Location: [facility];  Service: General;  Laterality: Bilateral;  PEC BLOCK   WISDOM TOOTH EXTRACTION      Family History  Problem Relation Age of Onset   Lupus Sister    Thyroid disease Sister    Thyroid disease Mother  Social History   Tobacco Use  Smoking Status Never  Smokeless Tobacco Never    Social History   Substance and Sexual Activity  Alcohol Use No   Alcohol/week: 0.0 standard drinks of alcohol     Allergies  Allergen Reactions   Lactose Intolerance (Gi) Diarrhea    Stomach pain   Lifitegrast Other (See Comments)    corneal lesion   Latex Rash   Systane Balance [Propylene Glycol] Rash   Tape Rash   Wound Dressing Adhesive Rash    Current Outpatient Medications  Medication Sig Dispense Refill   clobetasol (TEMOVATE) 0.05 % external solution Apply topically 2 (two) times daily 50 mL 1   cycloSPORINE, PF, (CEQUA) 0.09 % SOLN Place 1 drop into both eyes 2 (two) times daily 180 each 4   hydroxychloroquine (PLAQUENIL) 200 MG tablet TAKE 2 TABLETS BY MOUTH EVERY OTHER DAY ALTERNATING WITH 1 TABLET EVERY OTHER DAY. 135 tablet 3   norethindrone (AYGESTIN) 5 MG tablet Take 1 tablet by mouth twice daily until bleeding stop and then decrease to daily for at least two weeks. 40 tablet 1   rosuvastatin (CRESTOR) 20 MG tablet Take 1 tablet (20 mg total) by mouth daily. 90 tablet 3   UNABLE TO FIND Place 1 drop into both eyes 6 (six) times daily. Platelet rich growth factor eye drops      VITAMIN D PO Take by mouth. Takes a few times a month     No current facility-administered medications for this visit.    Review of Systems  Constitutional: Negative.   Respiratory: Negative.    Neurological: Negative.   Psychiatric/Behavioral:  The patient is nervous/anxious.      PHYSICAL EXAMINATION: BP (!) 150/84   Pulse 71   Resp 20   Ht '5\' 3"'$  (1.6 m)   Wt 115 lb (52.2 kg)   LMP 11/22/2021 (Approximate)   SpO2 96% Comment: RA  BMI 20.37 kg/m  Physical Exam Constitutional:      General: She is not in acute distress.    Appearance: Normal appearance. She is normal weight. She is not ill-appearing.  HENT:     Head: Normocephalic and atraumatic.  Eyes:     Extraocular Movements: Extraocular movements intact.  Cardiovascular:     Rate and Rhythm: Normal rate.  Pulmonary:     Effort: Pulmonary effort is normal. No respiratory distress.  Musculoskeletal:        General: Normal range of motion.     Cervical back: Normal range of motion.  Neurological:     General: No focal deficit present.     Mental Status: She is alert and oriented to person, place, and time.     Diagnostic Studies & Laboratory data:     Recent Radiology Findings:   MR CERVICAL SPINE WO CONTRAST  Result Date: 12/15/2021 CLINICAL DATA:  Neck pain with numbness in the left upper back for 4 months EXAM: MRI CERVICAL SPINE WITHOUT CONTRAST TECHNIQUE: Multiplanar, multisequence MR imaging of the cervical spine was performed. No intravenous contrast was administered. COMPARISON:  None Available. FINDINGS: Alignment: Straightening of cervical lordosis. Vertebrae: No fracture, evidence of discitis, or bone lesion. Cord: Normal signal and morphology. Posterior Fossa, vertebral arteries, paraspinal tissues: Negative. Disc levels: C2-3: Tiny central protrusion on axial images C3-4: Central protrusion that is upward pointing. The herniation contacts the ventral cord without compression C4-5: Disc narrowing and  bulging with central and bilateral foraminal protrusions. The central herniation mildly flattens the ventral cord. Advanced  left foraminal impingement. Moderate right foraminal narrowing. C5-6: Disc narrowing with asymmetric right uncovertebral spurring. Moderate right foraminal narrowing, see axial gradient images. Small central protrusion without neural compression C6-7: Disc bulging with right foraminal protrusion. Moderate right foraminal narrowing. C7-T1:Unremarkable. IMPRESSION: 1. 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 2. C4-5 left more than right foraminal impingement from protrusion. 3. C5-6 and C6-7 moderate right foraminal narrowing. Electronically Signed   By: Jorje Guild M.D.   On: 12/15/2021 06:08   US PELVIC COMPLETE WITH TRANSVAGINAL  Result Date: 12/11/2021 CLINICAL DATA:  Menorrhagia, bleeding for 20 days, LMP 11/22/2021 EXAM: TRANSABDOMINAL AND TRANSVAGINAL ULTRASOUND OF PELVIS TECHNIQUE: Both transabdominal and transvaginal ultrasound examinations of the pelvis were performed. Transabdominal technique was performed for global imaging of the pelvis including uterus, ovaries, adnexal regions, and pelvic cul-de-sac. It was necessary to proceed with endovaginal exam following the transabdominal exam to visualize the endometrium and ovaries. COMPARISON:  CT abdomen and pelvis 06/22/2019 FINDINGS: Uterus Measurements: 11.1 x 4.6 x 6.1 cm = volume: 162 mL. Anteverted. Heterogeneous myometrium. Multiple small masses consistent with leiomyomata. Largest of these measure 2.8 cm posterior upper uterus, 12 mm anterior mid uterus, anterior lower LEFT uterus 3.1 cm. Some of the small leiomyomata are submucosal. Endometrium Thickness: 4 mm.  Poorly visualized due to multiple leiomyomata Right ovary Not visualized, likely obscured by bowel Left ovary Measurements: 2.8 x 1.4 x 1.4 cm = volume: 2.9 mL. Normal morphology without mass Other findings No free pelvic fluid or  adnexal masses. IMPRESSION: Multiple uterine leiomyomata, some of which are submucosal. Nonvisualization of RIGHT ovary. Electronically Signed   By: Lavonia Dana M.D.   On: 12/11/2021 13:49       I have independently reviewed the above radiology studies  and reviewed the findings with the patient.   Recent Lab Findings: Lab Results  Component Value Date   WBC 3.5 12/11/2021   HGB 11.1 12/11/2021   HCT 33.3 (L) 12/11/2021   PLT 92 (LL) 12/11/2021   GLUCOSE 90 12/11/2021   CHOL 191 07/17/2018   TRIG 167.0 (H) 07/17/2018   HDL 53.70 07/17/2018   LDLCALC 104 (H) 07/17/2018   ALT 24 12/11/2021   AST 29 12/11/2021   NA 142 12/11/2021   K 4.1 12/11/2021   CL 104 12/11/2021   CREATININE 0.56 (L) 12/11/2021   BUN 12 12/11/2021   CO2 24 12/11/2021   TSH 1.05 04/06/2019      Assessment / Plan:   51 year old female with a 6 mm right lower lobe groundglass opacity which appears stable.  She is a lifelong non-smoker but also has a history of Sjogren's disease.  We had a very long discussion about biopsy and surgery.  At this point she remains hesitant to proceed with any surgical intervention she would like to continue surveillance for now.  If there is any interval growth or change in assistance he is opacity and I explained to her that this would be a side that either biopsy or surgery diagnosis.  She is agreeable with that plan.  She will follow-up in 6 months with another CT chest.     I  spent 20 minutes with  the patient face to face in counseling and coordination of care.    Lajuana Matte 12/16/2021 5:19 PM

## 2021-12-17 ENCOUNTER — Encounter: Payer: Self-pay | Admitting: Family Medicine

## 2021-12-18 DIAGNOSIS — Z79899 Other long term (current) drug therapy: Secondary | ICD-10-CM | POA: Diagnosis not present

## 2021-12-18 DIAGNOSIS — H04123 Dry eye syndrome of bilateral lacrimal glands: Secondary | ICD-10-CM | POA: Diagnosis not present

## 2021-12-18 DIAGNOSIS — M3500 Sicca syndrome, unspecified: Secondary | ICD-10-CM | POA: Diagnosis not present

## 2021-12-18 DIAGNOSIS — H524 Presbyopia: Secondary | ICD-10-CM | POA: Diagnosis not present

## 2021-12-26 ENCOUNTER — Encounter (HOSPITAL_BASED_OUTPATIENT_CLINIC_OR_DEPARTMENT_OTHER): Payer: Self-pay | Admitting: Obstetrics & Gynecology

## 2022-01-03 ENCOUNTER — Encounter (HOSPITAL_BASED_OUTPATIENT_CLINIC_OR_DEPARTMENT_OTHER): Payer: Self-pay | Admitting: Obstetrics & Gynecology

## 2022-01-04 NOTE — Progress Notes (Unsigned)
[facility] [place] Phone: [phone] Subjective:   Sarah [name], am serving as a scribe for Dr. Hulan [name].  I'm seeing this patient by the request  of:  Sarah Baton, MD  CC: back and neck pain follow up   [id number]  Sarah [name] is a 51 y.o. female coming in with complaint of back and neck pain. OMT 11/20/2021. Patient states that she has pain that is constant and numbness that it positional. Lumbar spine pain has improved but now having pain over lateral hips.    Knee pain has improved with not going down stairs. Wears knee brace with exercise.   Medications patient has been prescribed: None  Taking:  MRI cervical 12/14/2021 IMPRESSION: 1. 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 2. C4-5 left more than right foraminal impingement from protrusion. 3. C5-6 and C6-7 moderate right foraminal narrowing.        Reviewed prior external information including notes and imaging from previsou exam, outside providers and external EMR if available.   As well as notes that were available from care everywhere and other healthcare systems.  This includes patient's most recent rheumatology through [facility]  Past medical history, social, surgical and family history all reviewed in electronic medical record.  [name] pertanent information unless stated regarding to the chief complaint.   Past Medical History:  Diagnosis Date   ANA positive    ?Sjogrens   Breast neoplasm, Tis (LCIS), left    Cholecystitis    Cholelithiasis    Dry eye    Fibroid    Gall stones    GERD (gastroesophageal reflux disease)    Hypothyroidism    Takes synthroid   Infertility, female    Keratitis 2020   Lactose intolerance    Leukopenia    Sjogren's disease (HCC)    Thrombocytopenia (HCC)    Urticaria    episodes since childhood    Vitamin D deficiency disease    Takes Vit D   Weight loss     Allergies   Allergen Reactions   Lactose Intolerance (Gi) Diarrhea    Stomach pain   Lifitegrast Other (See Comments)    corneal lesion   Latex Rash   Systane Balance [Propylene Glycol] Rash   Tape Rash   Wound Dressing Adhesive Rash     Review of Systems:  [name] headache, visual changes, nausea, vomiting, diarrhea, constipation, dizziness, abdominal pain, skin rash, fevers, chills, night sweats, weight loss, swollen lymph nodes, body aches, joint swelling, chest pain, shortness of breath, mood changes. POSITIVE muscle aches  Objective  Last menstrual period 11/22/2021.   General: [name] apparent distress alert and oriented x3 mood and affect normal, dressed appropriately.  HEENT: Pupils equal, extraocular movements intact  Respiratory: Patient's speak in full sentences and does not appear short of breath  Cardiovascular: [name] lower extremity edema, non tender, [name] erythema  Gait MSK:  Back does have loss of lordosis  Neck exam does show    Osteopathic findings  C2 flexed rotated and side bent right C6 flexed rotated and side bent left T3 extended rotated and side bent right inhaled rib T9 extended rotated and side bent left L2 flexed rotated and side bent right Sacrum right on right       Assessment and Plan:  [name] problem-specific Assessment & Plan notes found for this encounter.    Nonallopathic problems  Decision today to treat with OMT was based on  Physical Exam  After verbal consent patient was treated with HVLA, ME, FPR techniques in cervical, rib, thoracic, lumbar, and sacral  areas  Patient tolerated the procedure well with improvement in symptoms  Patient given exercises, stretches and lifestyle modifications  See medications in patient instructions if given  Patient will follow up in 4-8 weeks     The above documentation has been reviewed and is accurate and complete Sarah Pulley, DO         Note: This dictation was prepared with Dragon dictation along with  smaller phrase technology. Any transcriptional errors that result from this process are unintentional.

## 2022-01-08 DIAGNOSIS — M3501 Sicca syndrome with keratoconjunctivitis: Secondary | ICD-10-CM | POA: Diagnosis not present

## 2022-01-08 DIAGNOSIS — M3219 Other organ or system involvement in systemic lupus erythematosus: Secondary | ICD-10-CM | POA: Diagnosis not present

## 2022-01-08 DIAGNOSIS — R918 Other nonspecific abnormal finding of lung field: Secondary | ICD-10-CM | POA: Diagnosis not present

## 2022-01-09 ENCOUNTER — Ambulatory Visit (INDEPENDENT_AMBULATORY_CARE_PROVIDER_SITE_OTHER): Payer: 59 | Admitting: Family Medicine

## 2022-01-09 VITALS — BP 112/80 | HR 76 | Ht 63.0 in | Wt 115.0 lb

## 2022-01-09 DIAGNOSIS — M9902 Segmental and somatic dysfunction of thoracic region: Secondary | ICD-10-CM

## 2022-01-09 DIAGNOSIS — M9901 Segmental and somatic dysfunction of cervical region: Secondary | ICD-10-CM | POA: Diagnosis not present

## 2022-01-09 DIAGNOSIS — M5010 Cervical disc disorder with radiculopathy, unspecified cervical region: Secondary | ICD-10-CM | POA: Diagnosis not present

## 2022-01-09 DIAGNOSIS — M9908 Segmental and somatic dysfunction of rib cage: Secondary | ICD-10-CM

## 2022-01-09 DIAGNOSIS — M9904 Segmental and somatic dysfunction of sacral region: Secondary | ICD-10-CM

## 2022-01-09 DIAGNOSIS — M9903 Segmental and somatic dysfunction of lumbar region: Secondary | ICD-10-CM

## 2022-01-09 DIAGNOSIS — M542 Cervicalgia: Secondary | ICD-10-CM

## 2022-01-09 NOTE — Patient Instructions (Addendum)
Neurosurgery with [facility]  Be active in every other way  Follow up in 6-8 weeks

## 2022-01-10 NOTE — Assessment & Plan Note (Signed)
Discussed with patient about home exercises and icing regimen.  Discussed which activities to do and which ones to avoid.  We discussed patient's MRI in great detail and showed her the images.  We discussed different treatment options and patient would like to try to discuss with orthopedic surgeon to see if anything else could be more beneficial with patient still having chronic pain on a regular basis.  Patient still wants to avoid different medications but we did discuss Cymbalta in great detail.  Could be a possibility.  Does respond well to osteopathic manipulation.  Follow-up again in 6 to 8 weeks

## 2022-01-25 DIAGNOSIS — M5412 Radiculopathy, cervical region: Secondary | ICD-10-CM | POA: Diagnosis not present

## 2022-01-26 DIAGNOSIS — Z9013 Acquired absence of bilateral breasts and nipples: Secondary | ICD-10-CM | POA: Diagnosis not present

## 2022-01-26 DIAGNOSIS — Z853 Personal history of malignant neoplasm of breast: Secondary | ICD-10-CM | POA: Diagnosis not present

## 2022-01-26 DIAGNOSIS — R911 Solitary pulmonary nodule: Secondary | ICD-10-CM | POA: Diagnosis not present

## 2022-02-07 DIAGNOSIS — R911 Solitary pulmonary nodule: Secondary | ICD-10-CM | POA: Diagnosis not present

## 2022-02-27 DIAGNOSIS — Z6820 Body mass index (BMI) 20.0-20.9, adult: Secondary | ICD-10-CM | POA: Diagnosis not present

## 2022-02-27 DIAGNOSIS — R21 Rash and other nonspecific skin eruption: Secondary | ICD-10-CM | POA: Diagnosis not present

## 2022-02-27 DIAGNOSIS — M3500 Sicca syndrome, unspecified: Secondary | ICD-10-CM | POA: Diagnosis not present

## 2022-02-27 DIAGNOSIS — Z79899 Other long term (current) drug therapy: Secondary | ICD-10-CM | POA: Diagnosis not present

## 2022-02-27 DIAGNOSIS — H169 Unspecified keratitis: Secondary | ICD-10-CM | POA: Diagnosis not present

## 2022-02-27 DIAGNOSIS — M791 Myalgia, unspecified site: Secondary | ICD-10-CM | POA: Diagnosis not present

## 2022-02-27 DIAGNOSIS — R7989 Other specified abnormal findings of blood chemistry: Secondary | ICD-10-CM | POA: Diagnosis not present

## 2022-02-27 DIAGNOSIS — R5382 Chronic fatigue, unspecified: Secondary | ICD-10-CM | POA: Diagnosis not present

## 2022-02-27 DIAGNOSIS — D72819 Decreased white blood cell count, unspecified: Secondary | ICD-10-CM | POA: Diagnosis not present

## 2022-03-12 NOTE — Progress Notes (Signed)
[facility] [address] [place] [address] Phone: [phone] Subjective:   Bruce Donath, am serving as a scribe for Dr. Antoine Primas.  I'm seeing this patient by the request  of:  Creola Corn, MD  CC: Neck and back pain, left shoulder    [id number]  Terra Kimmey is a 52 y.o. female coming in with complaint of back and neck pain. OMT 01/09/2022. Patient states that she injured her L shoulder last week at the gym doing an exercise class. Painful to do push ups and shoulder flexion with 4#. Anterior shoulder pain. Unable to lift arm last week. Denies any numbness or tingling.   L knee is improving. Medial pain intermittently.  Neck and upper back stiffness.   Pain in piriformis B recently. Using jacuzzi for water massage.   Medications patient has been prescribed:   Taking:      Reviewed prior external information including notes and imaging from previsou exam, outside providers and external EMR if available.   As well as notes that were available from care everywhere and other healthcare systems.  Past medical history, social, surgical and family history all reviewed in electronic medical record.  No pertanent information unless stated regarding to the chief complaint.   Past Medical History:  Diagnosis Date   ANA positive    ?Sjogrens   Breast neoplasm, Tis (LCIS), left    Cholecystitis    Cholelithiasis    Dry eye    Fibroid    Gall stones    GERD (gastroesophageal reflux disease)    Hypothyroidism    Takes synthroid   Infertility, female    Keratitis 2020   Lactose intolerance    Leukopenia    Sjogren's disease (HCC)    Thrombocytopenia (HCC)    Urticaria    episodes since childhood    Vitamin D deficiency disease    Takes Vit D   Weight loss     Allergies  Allergen Reactions   Lactose Intolerance (Gi) Diarrhea    Stomach pain   Lifitegrast Other (See Comments)    corneal lesion   Latex Rash   Systane  Balance [Propylene Glycol] Rash   Tape Rash   Wound Dressing Adhesive Rash     Review of Systems:  No headache, visual changes, nausea, vomiting, diarrhea, constipation, dizziness, abdominal pain, skin rash, fevers, chills, night sweats, weight loss, swollen lymph nodes, body aches, joint swelling, chest pain, shortness of breath, mood changes. POSITIVE muscle aches  Objective  Blood pressure 102/64, pulse 84, height 5\' 3"  (1.6 m), weight 116 lb (52.6 kg), SpO2 99 %.   General: No apparent distress alert and oriented x3 mood and affect normal, dressed appropriately.  HEENT: Pupils equal, extraocular movements intact  Respiratory: Patient's speak in full sentences and does not appear short of breath  Cardiovascular: No lower extremity edema, non tender, no erythema  MSK:  Back continues to have some discomfort of the back and neck noted.  Some limited range of motion patient does have tightness noted as well.  Chest exam shows the patient does have tenderness to palpation on the left side of the chest area.  Very mild positive O'Brien's.  Otherwise fairly full range of motion of the left shoulder.  Limited muscular skeletal ultrasound was performed and interpreted by Antoine Primas, M   Limited ultrasound shows the patient did have a irritation of the chest musculature at the musculotendinous juncture noted.  No true defect noted.  Osteopathic findings  C6 flexed rotated and side bent left T3 extended rotated and side bent right inhaled rib T8 extended rotated and side bent left L1 flexed rotated and side bent right Sacrum right on right       Assessment and Plan:  Cervical disc disorder with radiculopathy of cervical region No significant tightness noted.  Discussed icing regimen and home exercises.  Patient does have more of the pectoralis tightness noted that is very concerning.  We discussed with patient icing regimen and home exercises.  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  Hopeful that this is just more from the patient's work out and likely will continue to continue to improve slowly.  Work with Event organiser, icing regimen, follow-up again in 6 to 8 weeks.  Degenerative lumbar disc Patient does have a loss of lordosis.  Responding well to osteopathic manipulation well.  Continue to work on posture noted on this, discussed which activities to do and which ones to avoid.  Follow-up again in 6 to 8 weeks    Nonallopathic problems  Decision today to treat with OMT was based on Physical Exam  After verbal consent patient was treated with HVLA, ME, FPR techniques in cervical, rib, thoracic, lumbar, and sacral  areas  Patient tolerated the procedure well with improvement in symptoms  Patient given exercises, stretches and lifestyle modifications  See medications in patient instructions if given  Patient will follow up in 4-8 weeks     The above documentation has been reviewed and is accurate and complete Judi Saa, DO         Note: This dictation was prepared with Dragon dictation along with smaller phrase technology. Any transcriptional errors that result from this process are unintentional.

## 2022-03-13 ENCOUNTER — Ambulatory Visit (INDEPENDENT_AMBULATORY_CARE_PROVIDER_SITE_OTHER): Payer: 59 | Admitting: Family Medicine

## 2022-03-13 ENCOUNTER — Ambulatory Visit: Payer: Self-pay

## 2022-03-13 VITALS — BP 102/64 | HR 84 | Ht 63.0 in | Wt 116.0 lb

## 2022-03-13 DIAGNOSIS — M7582 Other shoulder lesions, left shoulder: Secondary | ICD-10-CM

## 2022-03-13 DIAGNOSIS — M9901 Segmental and somatic dysfunction of cervical region: Secondary | ICD-10-CM | POA: Diagnosis not present

## 2022-03-13 DIAGNOSIS — M9902 Segmental and somatic dysfunction of thoracic region: Secondary | ICD-10-CM | POA: Diagnosis not present

## 2022-03-13 DIAGNOSIS — M5136 Other intervertebral disc degeneration, lumbar region: Secondary | ICD-10-CM | POA: Diagnosis not present

## 2022-03-13 DIAGNOSIS — M9908 Segmental and somatic dysfunction of rib cage: Secondary | ICD-10-CM

## 2022-03-13 DIAGNOSIS — M9904 Segmental and somatic dysfunction of sacral region: Secondary | ICD-10-CM

## 2022-03-13 DIAGNOSIS — M9903 Segmental and somatic dysfunction of lumbar region: Secondary | ICD-10-CM | POA: Diagnosis not present

## 2022-03-13 DIAGNOSIS — M5010 Cervical disc disorder with radiculopathy, unspecified cervical region: Secondary | ICD-10-CM

## 2022-03-13 DIAGNOSIS — M25512 Pain in left shoulder: Secondary | ICD-10-CM

## 2022-03-13 NOTE — Assessment & Plan Note (Signed)
No significant tightness noted.  Discussed icing regimen and home exercises.  Patient does have more of the pectoralis tightness noted that is very concerning.  We discussed with patient icing regimen and home exercises.  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  Hopeful that this is just more from the patient's work out and likely will continue to continue to improve slowly.  Work with Product/process development scientist, icing regimen, follow-up again in 6 to 8 weeks.

## 2022-03-13 NOTE — Assessment & Plan Note (Signed)
Patient does have a loss of lordosis.  Responding well to osteopathic manipulation well.  Continue to work on posture noted on this, discussed which activities to do and which ones to avoid.  Follow-up again in 6 to 8 weeks

## 2022-03-13 NOTE — Patient Instructions (Addendum)
Do prescribed exercises at least 3x a week  Try not to extend through lifting on Gluteal medius See you again in 6 weeks

## 2022-03-14 ENCOUNTER — Encounter: Payer: Self-pay | Admitting: Family Medicine

## 2022-03-14 DIAGNOSIS — M7582 Other shoulder lesions, left shoulder: Secondary | ICD-10-CM | POA: Insufficient documentation

## 2022-03-14 NOTE — Assessment & Plan Note (Signed)
Discussed with patient icing regimen and home exercises, which activities to do and which ones to avoid, increase activity slowly.  Follow-up again in 6 weeks.  With patient having a history of breast cancer have discussed that advanced imaging could be warranted.  Patient is being followed for pulmonary nodule and being watched very closely by other providers.

## 2022-04-09 ENCOUNTER — Other Ambulatory Visit (HOSPITAL_COMMUNITY): Payer: Self-pay

## 2022-04-15 LAB — NMR, LIPOPROFILE
Cholesterol, Total: 127 mg/dL (ref 100–199)
HDL Particle Number: 33.3 umol/L (ref >=30.5)
HDL-C: 75 mg/dL (ref >39)
LDL Particle Number: 410 nmol/L (ref <1000)
LDL Size: 20.6 nm (ref >20.5)
LDL-C (NIH Calc): 41 mg/dL (ref 0–99)
LP-IR Score: <25 (ref <=45)
Small LDL Particle Number: 163 nmol/L (ref <=527)
Triglycerides: 49 mg/dL (ref 0–149)

## 2022-04-20 ENCOUNTER — Ambulatory Visit: Payer: 59 | Attending: Internal Medicine | Admitting: Internal Medicine

## 2022-04-20 ENCOUNTER — Encounter: Payer: Self-pay | Admitting: Internal Medicine

## 2022-04-20 VITALS — BP 106/63 | HR 76 | Ht 63.0 in | Wt 115.6 lb

## 2022-04-20 DIAGNOSIS — I6523 Occlusion and stenosis of bilateral carotid arteries: Secondary | ICD-10-CM | POA: Diagnosis not present

## 2022-04-20 DIAGNOSIS — E785 Hyperlipidemia, unspecified: Secondary | ICD-10-CM | POA: Diagnosis not present

## 2022-04-20 DIAGNOSIS — I700 Atherosclerosis of aorta: Secondary | ICD-10-CM

## 2022-04-20 NOTE — Progress Notes (Signed)
LIPID CLINIC CONSULT NOTE  Chief Complaint:  Follow-up dyslipidemia  Primary Care Physician: Shon Baton, MD  Primary Cardiologist:  None  HPI:  Sarah [name] is a 52 y.o. female who is being seen today for dyslipidemia (self-referred). 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  She reports recent work-up for potential TIA which involved a CT angiogram of the head and neck that demonstrated mild atherosclerotic changes at the carotid bifurcations as well as aortic atherosclerosis.  Prior to that in May 2022 she underwent calcium scoring which showed no coronary calcification.  There was however mild calcification of the descending aorta.  As part of her work-up she saw Dr. Haroldine Laws who ordered a monitor and echo with bubble study.  The bubble study was negative which showed normal LV function.  The monitor showed 3 episodes of SVT.  She says she has them intermittently and sometimes a little more sustained.  She is not on medication for this.  She had a lipid NMR through her primary care provider in July showing total LDL particle number of 1291, LDL-C of 111, HDL-C of 63, triglycerides 110 and a small LDL particle number was low at 259.  LDL size is large at 21.8 Angstrom.  I reviewed those results with her today.  Subsequent to this she was started on rosuvastatin 5 mg twice weekly.  She has not had repeat lipids.  She does report that her mother has some carotid atherosclerosis however she is in her 53s.  She reports recent significant changes in her diet including increasing fish, decreasing meats and more vegetables, less sugar and more exercise.  04/20/2022  [name] returns today for follow-up.  Overall she is doing well on rosuvastatin.  She has had further decrease in her lipids.  Her LDL particle number is now 410 with an LDL-C of 41, triglycerides 127 and HDL 75.  Small LDL particle is very low at 163.  She has no overt  side effects from the medication.  She said recently she had dysfunctional uterine bleeding which is unusual for her but I think it is unlikely to be the statin although she did find some case reports of this.  She also is 52 years old and this may be normal.  She has not had repeat carotid imaging although she was noted to have chronic atherosclerosis on CT angiography.  PMHx:  Past Medical History:  Diagnosis Date   ANA positive    ?Sjogrens   Breast neoplasm, Tis (LCIS), left    Cholecystitis    Cholelithiasis    Dry eye    Fibroid    Gall stones    GERD (gastroesophageal reflux disease)    Hypothyroidism    Takes synthroid   Infertility, female    Keratitis 2020   Lactose intolerance    Leukopenia    Sjogren's disease ([place])    Thrombocytopenia ([place])    Urticaria    episodes since childhood    Vitamin D deficiency disease    Takes Vit D   Weight loss     Past Surgical History:  Procedure Laterality Date   CHOLECYSTECTOMY N/A 07/13/2014   Procedure: LAPAROSCOPIC CHOLECYSTECTOMY WITH INTRAOPERATIVE CHOLANGIOGRAM;  Surgeon: Rolm Bookbinder, MD;  Location: [facility];  Service: General;  Laterality: N/A;   egg retrieval  2008; 2011; 2012   multiple IVF cycles   ENDOMETRIAL BIOPSY     SIMPLE MASTECTOMY WITH AXILLARY SENTINEL NODE BIOPSY Bilateral 07/15/2019  Procedure: RIGHT RISK REDUCING MASTECTOMY AND LEFT MASTECTOMY  WITH LEFT AXILLARY SENTINEL NODE BIOPSY;  Surgeon: Rolm Bookbinder, MD;  Location: [facility];  Service: General;  Laterality: Bilateral;  PEC BLOCK   WISDOM TOOTH EXTRACTION      FAMHx:  Family History  Problem Relation Age of Onset   Lupus Sister    Thyroid disease Sister    Thyroid disease Mother     SOCHx:   reports that she has never smoked. She has never used smokeless tobacco. She reports that she does not drink alcohol and does not use drugs.  ALLERGIES:  Allergies  Allergen Reactions   Lactose Intolerance (Gi) Diarrhea    Stomach pain    Lifitegrast Other (See Comments)    corneal lesion   Latex Rash   Systane Balance [Propylene Glycol] Rash   Tape Rash   Wound Dressing Adhesive Rash    ROS: Pertinent items noted in HPI and remainder of comprehensive ROS otherwise negative.  HOME MEDS: Current Outpatient Medications on File Prior to Visit  Medication Sig Dispense Refill   clobetasol (TEMOVATE) 0.05 % external solution Apply topically 2 (two) times daily 50 mL 1   cycloSPORINE, PF, (CEQUA) 0.09 % SOLN Place 1 drop into both eyes 2 (two) times daily 180 each 4   hydroxychloroquine (PLAQUENIL) 200 MG tablet TAKE 2 TABLETS BY MOUTH EVERY OTHER DAY ALTERNATING WITH 1 TABLET EVERY OTHER DAY. 135 tablet 3   rosuvastatin (CRESTOR) 20 MG tablet Take 1 tablet (20 mg total) by mouth daily. 90 tablet 3   UNABLE TO FIND Place 1 drop into both eyes 6 (six) times daily. Plasma reach growth factor Platelet rich growth factor eye drops     VITAMIN D PO Take by mouth. Takes a few times a month     No current facility-administered medications on file prior to visit.    LABS/IMAGING: No results found for this or any previous visit (from the past 48 hour(s)). No results found.  LIPID PANEL:    Component Value Date/Time   CHOL 191 07/17/2018 1333   CHOL 217 (H) 09/12/2016 0941   TRIG 167.0 (H) 07/17/2018 1333   HDL 53.70 07/17/2018 1333   HDL 67 09/12/2016 0941   CHOLHDL 4 07/17/2018 1333   VLDL 33.4 07/17/2018 1333   LDLCALC 104 (H) 07/17/2018 1333   LDLCALC 125 (H) 09/12/2016 0941    WEIGHTS: Wt Readings from Last 3 Encounters:  04/20/22 115 lb 9.6 oz (52.4 kg)  03/13/22 116 lb (52.6 kg)  01/09/22 115 lb (52.2 kg)    VITALS: BP 106/63   Pulse 76   Ht 5\' 3"  (1.6 m)   Wt 115 lb 9.6 oz (52.4 kg)   SpO2 99%   BMI 20.48 kg/m   EXAM: Deferred  EKG: Deferred  ASSESSMENT: Mixed dyslipidemia, goal LDL less than 70 Carotid and aortic atherosclerosis Zero coronary calcium (05/2020)  PLAN: 1.   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  Her cholesterol is at target.  She had some carotid aortic atherosclerosis but no coronary calcium.  Because there was some mild stenosis of the carotids, would like to repeat carotid Dopplers which we will schedule prior to her follow-up next year and repeat lipids at that time.  No changes were made today.  She tells me she is transitioning to a new position with the [employer] in [place].  Follow-up with me annually or sooner as necessary.  Pixie Casino, MD, [facility], [place]  [facility] of the Advanced Lipid Disorders &  Cardiovascular Risk Reduction Clinic Diplomate of the American Board of Clinical Lipidology Attending Cardiologist  Direct Dial: [phone]  Fax: [phone]  Website:  [email or web address] 04/20/2022, 9:05 AM

## 2022-04-20 NOTE — Patient Instructions (Signed)
Medication Instructions:  NO CHANGES  *If you need a refill on your cardiac medications before your next appointment, please call your pharmacy*   Lab Work: FASTING lab work to check cholesterol in 12 months ** complete about 1 week before next visit  If you have labs (blood work) drawn today and your tests are completely normal, you will receive your results only by: [place] (if you have MyChart) OR A paper copy in the mail If you have any lab test that is abnormal or we need to change your treatment, we will call you to review the results.   Testing: Carotid Doppler in 1 year   Follow-Up: At [facility], you and your health needs are our priority.  As part of our continuing mission to provide you with exceptional heart care, we have created designated Provider Care Teams.  These Care Teams include your primary Cardiologist (physician) and Advanced Practice Providers (APPs -  Physician Assistants and Nurse Practitioners) who all work together to provide you with the care you need, when you need it.  We recommend signing up for the patient portal called "MyChart".  Sign up information is provided on this After Visit Summary.  MyChart is used to connect with patients for Virtual Visits (Telemedicine).  Patients are able to view lab/test results, encounter notes, upcoming appointments, etc.  Non-urgent messages can be sent to your provider as well.   To learn more about what you can do with MyChart, go to [email or web address].    Your next appointment:    12 months with Dr. Debara Pickett

## 2022-04-24 NOTE — Progress Notes (Deleted)
  [place] Phone: [phone] Subjective:    I'm seeing this patient by the request  of:  Shon Baton, MD  CC:   [id number]  Sarah [name] is a 52 y.o. female coming in with complaint of back and neck pain. OMT on 03/13/2022. Patient states   Medications patient has been prescribed:   Taking:         Reviewed prior external information including notes and imaging from previsou exam, outside providers and external EMR if available.   As well as notes that were available from care everywhere and other healthcare systems.  Past medical history, social, surgical and family history all reviewed in electronic medical record.  No pertanent information unless stated regarding to the chief complaint.   Past Medical History:  Diagnosis Date   ANA positive    ?Sjogrens   Breast neoplasm, Tis (LCIS), left    Cholecystitis    Cholelithiasis    Dry eye    Fibroid    Gall stones    GERD (gastroesophageal reflux disease)    Hypothyroidism    Takes synthroid   Infertility, female    Keratitis 2020   Lactose intolerance    Leukopenia    Sjogren's disease (HCC)    Thrombocytopenia (HCC)    Urticaria    episodes since childhood    Vitamin D deficiency disease    Takes Vit D   Weight loss     Allergies  Allergen Reactions   Lactose Intolerance (Gi) Diarrhea    Stomach pain   Lifitegrast Other (See Comments)    corneal lesion   Latex Rash   Systane Balance [Propylene Glycol] Rash   Tape Rash   Wound Dressing Adhesive Rash     Review of Systems:  No headache, visual changes, nausea, vomiting, diarrhea, constipation, dizziness, abdominal pain, skin rash, fevers, chills, night sweats, weight loss, swollen lymph nodes, body aches, joint swelling, chest pain, shortness of breath, mood changes. POSITIVE muscle aches  Objective  There were no vitals taken for this visit.   General: No apparent distress  alert and oriented x3 mood and affect normal, dressed appropriately.  HEENT: Pupils equal, extraocular movements intact  Respiratory: Patient's speak in full sentences and does not appear short of breath  Cardiovascular: No lower extremity edema, non tender, no erythema  Gait MSK:  Back   Osteopathic findings  C2 flexed rotated and side bent right C6 flexed rotated and side bent left T3 extended rotated and side bent right inhaled rib T9 extended rotated and side bent left L2 flexed rotated and side bent right Sacrum right on right       Assessment and Plan:  No problem-specific Assessment & Plan notes found for this encounter.    Nonallopathic problems  Decision today to treat with OMT was based on Physical Exam  After verbal consent patient was treated with HVLA, ME, FPR techniques in cervical, rib, thoracic, lumbar, and sacral  areas  Patient tolerated the procedure well with improvement in symptoms  Patient given exercises, stretches and lifestyle modifications  See medications in patient instructions if given  Patient will follow up in 4-8 weeks             Note: This dictation was prepared with Dragon dictation along with smaller phrase technology. Any transcriptional errors that result from this process are unintentional.

## 2022-05-01 ENCOUNTER — Ambulatory Visit: Payer: 59 | Admitting: Family Medicine

## 2022-05-10 ENCOUNTER — Other Ambulatory Visit: Payer: Self-pay | Admitting: Thoracic Surgery (Cardiothoracic Vascular Surgery)

## 2022-05-10 DIAGNOSIS — R911 Solitary pulmonary nodule: Secondary | ICD-10-CM

## 2022-05-25 DIAGNOSIS — R918 Other nonspecific abnormal finding of lung field: Secondary | ICD-10-CM | POA: Diagnosis not present

## 2022-05-25 DIAGNOSIS — R911 Solitary pulmonary nodule: Secondary | ICD-10-CM | POA: Diagnosis not present

## 2022-05-28 ENCOUNTER — Other Ambulatory Visit (HOSPITAL_COMMUNITY): Payer: Self-pay

## 2022-05-29 ENCOUNTER — Other Ambulatory Visit (HOSPITAL_COMMUNITY): Payer: Self-pay

## 2022-05-29 MED ORDER — HYDROXYCHLOROQUINE SULFATE 200 MG PO TABS
400.0000 mg | ORAL_TABLET | ORAL | 3 refills | Status: DC
  Filled 2022-05-29: qty 135, 90d supply, fill #0
  Filled 2022-08-31: qty 135, 90d supply, fill #1
  Filled 2022-12-07: qty 135, 90d supply, fill #0

## 2022-05-30 DIAGNOSIS — R918 Other nonspecific abnormal finding of lung field: Secondary | ICD-10-CM | POA: Diagnosis not present

## 2022-06-01 ENCOUNTER — Other Ambulatory Visit (HOSPITAL_COMMUNITY): Payer: Self-pay

## 2022-06-14 ENCOUNTER — Other Ambulatory Visit (HOSPITAL_COMMUNITY): Payer: Self-pay

## 2022-06-15 ENCOUNTER — Other Ambulatory Visit (HOSPITAL_COMMUNITY): Payer: Self-pay

## 2022-06-22 ENCOUNTER — Ambulatory Visit (INDEPENDENT_AMBULATORY_CARE_PROVIDER_SITE_OTHER): Payer: 59 | Admitting: Thoracic Surgery (Cardiothoracic Vascular Surgery)

## 2022-06-22 ENCOUNTER — Encounter: Payer: Self-pay | Admitting: Thoracic Surgery (Cardiothoracic Vascular Surgery)

## 2022-06-22 ENCOUNTER — Other Ambulatory Visit: Payer: 59

## 2022-06-22 VITALS — BP 119/71 | HR 90 | Resp 20 | Ht 63.0 in | Wt 113.0 lb

## 2022-06-22 DIAGNOSIS — R911 Solitary pulmonary nodule: Secondary | ICD-10-CM

## 2022-06-26 NOTE — Progress Notes (Signed)
[address]       [place] [address]             [phone]                    [facility] Medical Record #[id number] Date of Birth: 09-30-1970  Referring: Josephine Igo, DO Primary Care: Creola Corn, MD Primary Cardiologist: None  Chief Complaint:    Chief Complaint  Patient presents with   Follow-up    6 month f/u with chest CT    History of Present Illness:    Sarah [name] 52 y.o. female presents to discuss results of most recent CT chest.  She has a known groundglass opacity in the right lower lobe.  She has been seen by [facility] thoracic surgery.  They recommended that she undergo a segmentectomy diagnostic purposes.  She also has had a breast biopsy which showed LCIS.   She has no complaints today.  She recently started working at the [employer].    Smoking Hx: Life long non-smoker   Zubrod Score: At the time of surgery this patient's most appropriate activity status/level should be described as: [x]     0    Normal activity, no symptoms []     1    Restricted in physical strenuous activity but ambulatory, able to do out light work []     2    Ambulatory and capable of self care, unable to do work activities, up and about               >50 % of waking hours                              []     3    Only limited self care, in bed greater than 50% of waking hours []     4    Completely disabled, no self care, confined to bed or chair []     5    Moribund   Past Medical History:  Diagnosis Date   ANA positive    ?Sjogrens   Breast neoplasm, Tis (LCIS), left    Cholecystitis    Cholelithiasis    Dry eye    Fibroid    Gall stones    GERD (gastroesophageal reflux disease)    Hypothyroidism    Takes synthroid   Infertility, female    Keratitis 2020   Lactose intolerance    Leukopenia    Sjogren's disease (HCC)    Thrombocytopenia (HCC)    Urticaria    episodes since childhood    Vitamin D deficiency disease    Takes Vit D   Weight loss      Past Surgical History:  Procedure Laterality Date   CHOLECYSTECTOMY N/A 07/13/2014   Procedure: LAPAROSCOPIC CHOLECYSTECTOMY WITH INTRAOPERATIVE CHOLANGIOGRAM;  Surgeon: Emelia Loron, MD;  Location: [facility] OR;  Service: General;  Laterality: N/A;   egg retrieval  2008; 2011; 2012   multiple IVF cycles   ENDOMETRIAL BIOPSY     SIMPLE MASTECTOMY WITH AXILLARY SENTINEL NODE BIOPSY Bilateral 07/15/2019   Procedure: RIGHT RISK REDUCING MASTECTOMY AND LEFT MASTECTOMY  WITH LEFT AXILLARY SENTINEL NODE BIOPSY;  Surgeon: Emelia Loron, MD;  Location: [facility] OR;  Service: General;  Laterality: Bilateral;  PEC BLOCK   WISDOM TOOTH EXTRACTION      Family History  Problem Relation Age of Onset   Lupus Sister  Thyroid disease Sister    Thyroid disease Mother      Social History   Tobacco Use  Smoking Status Never  Smokeless Tobacco Never    Social History   Substance and Sexual Activity  Alcohol Use No   Alcohol/week: 0.0 standard drinks of alcohol     Allergies  Allergen Reactions   Lactose Intolerance (Gi) Diarrhea    Stomach pain   Lifitegrast Other (See Comments)    corneal lesion   Latex Rash   Systane Balance [Propylene Glycol] Rash   Tape Rash   Wound Dressing Adhesive Rash    Current Outpatient Medications  Medication Sig Dispense Refill   clobetasol (TEMOVATE) 0.05 % external solution Apply topically 2 (two) times daily 50 mL 1   cycloSPORINE, PF, (CEQUA) 0.09 % SOLN Place 1 drop into both eyes 2 (two) times daily 180 each 4   hydroxychloroquine (PLAQUENIL) 200 MG tablet TAKE 2 TABLETS BY MOUTH EVERY OTHER DAY ALTERNATING WITH 1 TABLET EVERY OTHER DAY. 135 tablet 3   rosuvastatin (CRESTOR) 20 MG tablet Take 1 tablet (20 mg total) by mouth daily. 90 tablet 3   UNABLE TO FIND Place 1 drop into both eyes 6 (six) times daily. Plasma reach growth factor Platelet rich growth factor eye drops     VITAMIN D PO Take by mouth. Takes a few times a month     No current  facility-administered medications for this visit.    Review of Systems  Constitutional: Negative.   Respiratory: Negative.    Cardiovascular: Negative.   Neurological: Negative.   Psychiatric/Behavioral:  The patient is nervous/anxious.      PHYSICAL EXAMINATION: BP 119/71 (BP Location: Right Arm, Patient Position: Sitting, Cuff Size: Small)   Pulse 90   Resp 20   Ht 5\' 3"  (1.6 m)   Wt 113 lb (51.3 kg)   SpO2 97% Comment: RA  BMI 20.02 kg/m  Physical Exam Constitutional:      General: She is not in acute distress.    Appearance: Normal appearance. She is not ill-appearing.  HENT:     Head: Normocephalic and atraumatic.  Eyes:     Extraocular Movements: Extraocular movements intact.  Cardiovascular:     Rate and Rhythm: Normal rate.  Pulmonary:     Effort: Pulmonary effort is normal.  Abdominal:     General: Abdomen is flat. There is no distension.  Musculoskeletal:        General: Normal range of motion.     Cervical back: Normal range of motion and neck supple.  Skin:    General: Skin is warm and dry.  Neurological:     General: No focal deficit present.     Mental Status: She is alert and oriented to person, place, and time.     Diagnostic Studies & Laboratory data:     Recent Radiology Findings:   No results found.     I have independently reviewed the above radiology studies  and reviewed the findings with the patient.   Recent Lab Findings: Lab Results  Component Value Date   WBC 3.5 12/11/2021   HGB 11.1 12/11/2021   HCT 33.3 (L) 12/11/2021   PLT 92 (LL) 12/11/2021   GLUCOSE 90 12/11/2021   CHOL 191 07/17/2018   TRIG 167.0 (H) 07/17/2018   HDL 53.70 07/17/2018   LDLCALC 104 (H) 07/17/2018   ALT 24 12/11/2021   AST 29 12/11/2021   NA 142 12/11/2021   K 4.1 12/11/2021  CL 104 12/11/2021   CREATININE 0.56 (L) 12/11/2021   BUN 12 12/11/2021   CO2 24 12/11/2021   TSH 1.05 04/06/2019       Assessment / Plan:   52 year old female with  a 7-8 mm right lower lobe groundglass opacity which appears stable on most recent imaging from [facility]. She is a lifelong non-smoker but also has a history of Sjogren's disease. We had a very long discussion about biopsy and surgery. Since we have been following this for several years, and it has not changed, I have recommended that we proceed with a surgical biopsy in combination with navigational bronchoscopy with marking.  She does have some concerns about pathology results, so to alleviate this, I have recommended that we only do a wedge resection first.  If the final pathology is positive, then we will need to go back for a lobectomy.  She seems agreeable with that plan, but would like to wait until the fall.      I  spent 30 minutes with  the patient face to face in counseling and coordination of care.    Corliss Skains 06/26/2022 10:42 AM

## 2022-07-27 ENCOUNTER — Other Ambulatory Visit (HOSPITAL_COMMUNITY): Payer: Self-pay

## 2022-07-30 ENCOUNTER — Other Ambulatory Visit (HOSPITAL_COMMUNITY): Payer: Self-pay

## 2022-07-30 MED ORDER — CEQUA 0.09 % OP SOLN
1.0000 [drp] | Freq: Two times a day (BID) | OPHTHALMIC | 3 refills | Status: AC
  Filled 2022-07-30 – 2022-08-14 (×2): qty 180, 90d supply, fill #0
  Filled 2022-08-31: qty 60, 30d supply, fill #0
  Filled 2022-08-31 – 2022-09-10 (×3): qty 180, 90d supply, fill #0
  Filled 2022-12-07: qty 180, 90d supply, fill #1

## 2022-08-06 ENCOUNTER — Other Ambulatory Visit (HOSPITAL_COMMUNITY): Payer: Self-pay

## 2022-08-06 DIAGNOSIS — M3501 Sicca syndrome with keratoconjunctivitis: Secondary | ICD-10-CM | POA: Diagnosis not present

## 2022-08-06 DIAGNOSIS — Z79899 Other long term (current) drug therapy: Secondary | ICD-10-CM | POA: Diagnosis not present

## 2022-08-06 DIAGNOSIS — R918 Other nonspecific abnormal finding of lung field: Secondary | ICD-10-CM | POA: Diagnosis not present

## 2022-08-06 DIAGNOSIS — M3219 Other organ or system involvement in systemic lupus erythematosus: Secondary | ICD-10-CM | POA: Diagnosis not present

## 2022-08-06 MED ORDER — CLOBETASOL PROPIONATE 0.05 % EX SOLN
Freq: Two times a day (BID) | CUTANEOUS | 3 refills | Status: DC
  Filled 2022-08-06: qty 50, 30d supply, fill #0

## 2022-08-14 ENCOUNTER — Other Ambulatory Visit (HOSPITAL_COMMUNITY): Payer: Self-pay

## 2022-08-24 ENCOUNTER — Other Ambulatory Visit (HOSPITAL_BASED_OUTPATIENT_CLINIC_OR_DEPARTMENT_OTHER): Payer: Self-pay

## 2022-08-24 ENCOUNTER — Other Ambulatory Visit (HOSPITAL_COMMUNITY): Payer: Self-pay

## 2022-08-29 ENCOUNTER — Other Ambulatory Visit (HOSPITAL_COMMUNITY): Payer: Self-pay

## 2022-08-31 ENCOUNTER — Other Ambulatory Visit (HOSPITAL_COMMUNITY): Payer: Self-pay

## 2022-08-31 ENCOUNTER — Other Ambulatory Visit: Payer: Self-pay

## 2022-09-10 ENCOUNTER — Other Ambulatory Visit (HOSPITAL_BASED_OUTPATIENT_CLINIC_OR_DEPARTMENT_OTHER): Payer: Self-pay

## 2022-09-10 ENCOUNTER — Other Ambulatory Visit (HOSPITAL_COMMUNITY): Payer: Self-pay

## 2022-09-17 ENCOUNTER — Encounter (HOSPITAL_BASED_OUTPATIENT_CLINIC_OR_DEPARTMENT_OTHER): Payer: Self-pay | Admitting: Obstetrics & Gynecology

## 2022-09-17 ENCOUNTER — Ambulatory Visit (HOSPITAL_BASED_OUTPATIENT_CLINIC_OR_DEPARTMENT_OTHER): Payer: Federal, State, Local not specified - PPO | Admitting: Obstetrics & Gynecology

## 2022-09-17 VITALS — BP 141/86 | HR 85 | Ht 63.0 in | Wt 106.6 lb

## 2022-09-17 DIAGNOSIS — Z853 Personal history of malignant neoplasm of breast: Secondary | ICD-10-CM | POA: Diagnosis not present

## 2022-09-17 DIAGNOSIS — D251 Intramural leiomyoma of uterus: Secondary | ICD-10-CM | POA: Diagnosis not present

## 2022-09-17 DIAGNOSIS — D649 Anemia, unspecified: Secondary | ICD-10-CM | POA: Diagnosis not present

## 2022-09-17 DIAGNOSIS — Z01419 Encounter for gynecological examination (general) (routine) without abnormal findings: Secondary | ICD-10-CM

## 2022-09-17 DIAGNOSIS — Z9013 Acquired absence of bilateral breasts and nipples: Secondary | ICD-10-CM

## 2022-09-17 NOTE — Progress Notes (Signed)
52 y.o. [id number] Married [place] female here for annual exam.  Cycles are more irregular.  She does have a few days that are heavy when she is on a cycle.  Last hb wsa 7/8 and was 10.0.  Discussed evaluation and possible treatment.   Has a bulging disc in her neck.  She is managing this conservatively.  Has seen Sports Medicine in the past.  Has a 6-37mm nodule in her lung.  Has been seen at [place], [place] and at [facility].  She has a follow up CT scheduled on October 25th.  She is, of course, nervous about this.  She has been to [facility], local provider and [facility] and all have given different recommendation for evaluation/treatment.  She is waiting until next scan before making any decision.     Exercising: Yes.     Smoker:  no  Health Maintenance: Pap:  07/12/2020 History of abnormal Pap:  no MMG:  05/05/2019 Colonoscopy:  06/15/2019, follow up 10 BMD:  2021.  Discussed and will plan next year. Screening Labs: ordered.     reports that she has never smoked. She has never used smokeless tobacco. She reports that she does not drink alcohol and does not use drugs.  Past Medical History:  Diagnosis Date   ANA positive    ?Sjogrens   Breast neoplasm, Tis (LCIS), left    Cholecystitis    Cholelithiasis    Dry eye    Fibroid    Gall stones    GERD (gastroesophageal reflux disease)    Hypothyroidism    Takes synthroid   Infertility, female    Keratitis 2020   Lactose intolerance    Leukopenia    Sjogren's disease (HCC)    Thrombocytopenia (HCC)    Urticaria    episodes since childhood    Vitamin D deficiency disease    Takes Vit D   Weight loss     Past Surgical History:  Procedure Laterality Date   CHOLECYSTECTOMY N/A 07/13/2014   Procedure: LAPAROSCOPIC CHOLECYSTECTOMY WITH INTRAOPERATIVE CHOLANGIOGRAM;  Surgeon: Emelia Loron, MD;  Location: [facility] OR;  Service: General;  Laterality: N/A;   egg retrieval  2008; 2011; 2012   multiple IVF cycles   ENDOMETRIAL BIOPSY     SIMPLE  MASTECTOMY WITH AXILLARY SENTINEL NODE BIOPSY Bilateral 07/15/2019   Procedure: RIGHT RISK REDUCING MASTECTOMY AND LEFT MASTECTOMY  WITH LEFT AXILLARY SENTINEL NODE BIOPSY;  Surgeon: Emelia Loron, MD;  Location: [facility] OR;  Service: General;  Laterality: Bilateral;  PEC BLOCK   WISDOM TOOTH EXTRACTION      Current Outpatient Medications  Medication Sig Dispense Refill   clobetasol (TEMOVATE) 0.05 % external solution Apply topically 2 (two) times daily 50 mL 1   clobetasol (TEMOVATE) 0.05 % external solution Apply topically 2 (two) times daily. 50 mL 3   cycloSPORINE, PF, (CEQUA) 0.09 % SOLN Place 1 drop into both eyes 2 (two) times daily. 180 each 3   hydroxychloroquine (PLAQUENIL) 200 MG tablet TAKE 2 TABLETS BY MOUTH EVERY OTHER DAY ALTERNATING WITH 1 TABLET EVERY OTHER DAY. 135 tablet 3   rosuvastatin (CRESTOR) 20 MG tablet Take 1 tablet (20 mg total) by mouth daily. 90 tablet 3   UNABLE TO FIND Place 1 drop into both eyes 6 (six) times daily. Plasma reach growth factor Platelet rich growth factor eye drops     VITAMIN D PO Take by mouth. Takes a few times a month     No current facility-administered medications for this visit.  Family History  Problem Relation Age of Onset   Lupus Sister    Thyroid disease Sister    Thyroid disease Mother     ROS: Constitutional: negative Genitourinary: heavy menstrual flow at times  Exam:   BP (!) 141/86 (BP Location: Right Arm, Patient Position: Sitting, Cuff Size: Normal)   Pulse 85   Ht 5\' 3"  (1.6 m)   Wt 106 lb 9.6 oz (48.4 kg)   BMI 18.88 kg/m   Height: 5\' 3"  (160 cm)  General appearance: alert, cooperative and appears stated age Head: Normocephalic, without obvious abnormality, atraumatic Neck: no adenopathy, supple, symmetrical, trachea midline and thyroid normal to inspection and palpation Lungs: clear to auscultation bilaterally Breasts: no masses, well healed scars, no LAD Heart: regular rate and rhythm Abdomen: soft,  non-tender; bowel sounds normal; no masses,  no organomegaly Extremities: extremities normal, atraumatic, no cyanosis or edema Skin: Skin color, texture, turgor normal. No rashes or lesions Lymph nodes: Cervical, supraclavicular, and axillary nodes normal. No abnormal inguinal nodes palpated Neurologic: Grossly normal  Pelvic: External genitalia:  no lesions              Urethra:  normal appearing urethra with no masses, tenderness or lesions              Bartholins and Skenes: normal                 Vagina: normal appearing vagina with normal color and no discharge, no lesions              Cervix: no lesions              Pap taken: No. Bimanual Exam:  Uterus:  enlarged, 10-12 weeks size              Adnexa: normal adnexa               Rectovaginal: Confirms               Anus:  normal sphincter tone, no lesions  CMA, Ina Homes, chaperoned exam  Assessment/Plan: 1. Well woman exam with routine gynecological exam - Pap smear 07/12/2020 - Mammogram 05/05/2019.  Not longer indicated due to prior surgery. - Colonoscopy 06/15/2019.  Follow up 10 years.  - Bone mineral density 2021.  Will plan to repeat next year - screening blood work is done with Dr. Timothy Lasso - vaccines reviewed/updated  2. Anemia, unspecified type - Iron, TIBC and Ferritin Panel - B12 - Folate  3. Intramural leiomyoma of uterus - had ultrasound 11/2021  4. History of bilateral breast cancer  5. S/P bilateral mastectomy

## 2022-09-18 LAB — IRON,TIBC AND FERRITIN PANEL
Ferritin: 10 ng/mL — ABNORMAL LOW (ref 15–150)
Iron Saturation: 5 % — CL (ref 15–55)
Iron: 23 ug/dL — ABNORMAL LOW (ref 27–159)
Total Iron Binding Capacity: 465 ug/dL — ABNORMAL HIGH (ref 250–450)
UIBC: 442 ug/dL — ABNORMAL HIGH (ref 131–425)

## 2022-09-18 LAB — FOLATE: Folate: >20 ng/mL (ref >3.0)

## 2022-09-18 LAB — VITAMIN B12: Vitamin B-12: 1445 pg/mL — ABNORMAL HIGH (ref 232–1245)

## 2022-09-20 ENCOUNTER — Encounter (HOSPITAL_BASED_OUTPATIENT_CLINIC_OR_DEPARTMENT_OTHER): Payer: Self-pay | Admitting: Obstetrics & Gynecology

## 2022-09-21 ENCOUNTER — Other Ambulatory Visit (HOSPITAL_BASED_OUTPATIENT_CLINIC_OR_DEPARTMENT_OTHER): Payer: Self-pay | Admitting: Obstetrics & Gynecology

## 2022-09-21 DIAGNOSIS — D509 Iron deficiency anemia, unspecified: Secondary | ICD-10-CM | POA: Insufficient documentation

## 2022-09-21 DIAGNOSIS — D500 Iron deficiency anemia secondary to blood loss (chronic): Secondary | ICD-10-CM

## 2022-09-24 ENCOUNTER — Telehealth: Payer: Self-pay

## 2022-09-24 NOTE — Telephone Encounter (Signed)
Auth Submission: NO AUTH NEEDED Site of care: Site of care: [facility] Payer: [employer] Medication & CPT/J Code(s) submitted: Venofer (Iron Sucrose) J1756  Auth type: Buy/Bill PB Units/visits requested: 200mg  x 5 doses  Approval from: 09/24/22 to 01/24/23

## 2022-09-26 ENCOUNTER — Ambulatory Visit (INDEPENDENT_AMBULATORY_CARE_PROVIDER_SITE_OTHER): Payer: Federal, State, Local not specified - PPO

## 2022-09-26 VITALS — BP 134/80 | HR 67 | Temp 98.7°F | Resp 16 | Ht 63.0 in | Wt 108.8 lb

## 2022-09-26 DIAGNOSIS — D500 Iron deficiency anemia secondary to blood loss (chronic): Secondary | ICD-10-CM

## 2022-09-26 DIAGNOSIS — D509 Iron deficiency anemia, unspecified: Secondary | ICD-10-CM | POA: Diagnosis not present

## 2022-09-26 MED ORDER — DIPHENHYDRAMINE HCL 25 MG PO CAPS
25.0000 mg | ORAL_CAPSULE | Freq: Once | ORAL | Status: AC
  Administered 2022-09-26: 25 mg via ORAL
  Filled 2022-09-26: qty 1

## 2022-09-26 MED ORDER — ACETAMINOPHEN 325 MG PO TABS
650.0000 mg | ORAL_TABLET | Freq: Once | ORAL | Status: AC
  Administered 2022-09-26: 650 mg via ORAL
  Filled 2022-09-26: qty 2

## 2022-09-26 MED ORDER — SODIUM CHLORIDE 0.9 % IV SOLN
200.0000 mg | Freq: Once | INTRAVENOUS | Status: AC
  Administered 2022-09-26: 200 mg via INTRAVENOUS
  Filled 2022-09-26: qty 10

## 2022-09-26 NOTE — Patient Instructions (Signed)
 Iron Sucrose Injection What is this medication? IRON SUCROSE (EYE ern SOO krose) treats low levels of iron (iron deficiency anemia) in people with kidney disease. Iron is a mineral that plays an important role in making red blood cells, which carry oxygen from your lungs to the rest of your body. This medicine may be used for other purposes; ask your health care provider or pharmacist if you have questions. COMMON BRAND NAME(S): Venofer What should I tell my care team before I take this medication? They need to know if you have any of these conditions: Anemia not caused by low iron levels Heart disease High levels of iron in the blood Kidney disease Liver disease An unusual or allergic reaction to iron, other medications, foods, dyes, or preservatives Pregnant or trying to get pregnant Breastfeeding How should I use this medication? This medication is for infusion into a vein. It is given in a hospital or clinic setting. Talk to your care team about the use of this medication in children. While this medication may be prescribed for children as young as 2 years for selected conditions, precautions do apply. Overdosage: If you think you have taken too much of this medicine contact a poison control center or emergency room at once. NOTE: This medicine is only for you. Do not share this medicine with others. What if I miss a dose? Keep appointments for follow-up doses. It is important not to miss your dose. Call your care team if you are unable to keep an appointment. What may interact with this medication? Do not take this medication with any of the following: Deferoxamine Dimercaprol Other iron products This medication may also interact with the following: Chloramphenicol Deferasirox This list may not describe all possible interactions. Give your health care provider a list of all the medicines, herbs, non-prescription drugs, or dietary supplements you use. Also tell them if you smoke,  drink alcohol, or use illegal drugs. Some items may interact with your medicine. What should I watch for while using this medication? Visit your care team regularly. Tell your care team if your symptoms do not start to get better or if they get worse. You may need blood work done while you are taking this medication. You may need to follow a special diet. Talk to your care team. Foods that contain iron include: whole grains/cereals, dried fruits, beans, or peas, leafy green vegetables, and organ meats (liver, kidney). What side effects may I notice from receiving this medication? Side effects that you should report to your care team as soon as possible: Allergic reactions--skin rash, itching, hives, swelling of the face, lips, tongue, or throat Low blood pressure--dizziness, feeling faint or lightheaded, blurry vision Shortness of breath Side effects that usually do not require medical attention (report to your care team if they continue or are bothersome): Flushing Headache Joint pain Muscle pain Nausea Pain, redness, or irritation at injection site This list may not describe all possible side effects. Call your doctor for medical advice about side effects. You may report side effects to FDA at 1-800-FDA-1088. Where should I keep my medication? This medication is given in a hospital or clinic. It will not be stored at home. NOTE: This sheet is a summary. It may not cover all possible information. If you have questions about this medicine, talk to your doctor, pharmacist, or health care provider.  2024 Elsevier/Gold Standard (2022-06-22 00:00:00)

## 2022-09-26 NOTE — Progress Notes (Signed)
Diagnosis: Iron Deficiency Anemia  Provider:  Chilton Greathouse MD  Procedure: IV Infusion  IV Type: Peripheral, IV Location: R Antecubital  Venofer (Iron Sucrose), Dose: 200 mg  Infusion Start Time: 1146  Infusion Stop Time: 1220  Post Infusion IV Care: Observation period completed and Peripheral IV Discontinued  Discharge: Condition: Good, Destination: Home . AVS Declined  Performed by:  Adriana Mccallum, RN

## 2022-09-28 ENCOUNTER — Ambulatory Visit: Payer: Federal, State, Local not specified - PPO

## 2022-09-28 VITALS — BP 134/77 | HR 60 | Temp 97.4°F | Resp 18 | Ht 63.0 in | Wt 109.2 lb

## 2022-09-28 DIAGNOSIS — D509 Iron deficiency anemia, unspecified: Secondary | ICD-10-CM | POA: Diagnosis not present

## 2022-09-28 DIAGNOSIS — D500 Iron deficiency anemia secondary to blood loss (chronic): Secondary | ICD-10-CM

## 2022-09-28 MED ORDER — ACETAMINOPHEN 325 MG PO TABS
650.0000 mg | ORAL_TABLET | Freq: Once | ORAL | Status: AC
  Administered 2022-09-28: 650 mg via ORAL
  Filled 2022-09-28: qty 2

## 2022-09-28 MED ORDER — SODIUM CHLORIDE 0.9 % IV SOLN
200.0000 mg | Freq: Once | INTRAVENOUS | Status: AC
  Administered 2022-09-28: 200 mg via INTRAVENOUS
  Filled 2022-09-28: qty 10

## 2022-09-28 MED ORDER — DIPHENHYDRAMINE HCL 25 MG PO CAPS
25.0000 mg | ORAL_CAPSULE | Freq: Once | ORAL | Status: AC
  Administered 2022-09-28: 25 mg via ORAL
  Filled 2022-09-28: qty 1

## 2022-09-28 NOTE — Progress Notes (Signed)
Diagnosis: Iron Deficiency Anemia  Provider:  Chilton Greathouse MD  Procedure: IV Infusion  IV Type: Peripheral, IV Location: R Antecubital  Venofer (Iron Sucrose), Dose: 200 mg  Infusion Start Time: 1347  Infusion Stop Time: 1422  Post Infusion IV Care: Observation period completed and Peripheral IV Discontinued  Discharge: Condition: Good, Destination: Home . AVS Declined  Performed by:  Adriana Mccallum, RN

## 2022-10-03 ENCOUNTER — Ambulatory Visit (INDEPENDENT_AMBULATORY_CARE_PROVIDER_SITE_OTHER): Payer: Federal, State, Local not specified - PPO

## 2022-10-03 VITALS — BP 105/69 | HR 73 | Temp 98.4°F | Resp 18 | Ht 63.0 in | Wt 109.4 lb

## 2022-10-03 DIAGNOSIS — D509 Iron deficiency anemia, unspecified: Secondary | ICD-10-CM | POA: Diagnosis not present

## 2022-10-03 DIAGNOSIS — D500 Iron deficiency anemia secondary to blood loss (chronic): Secondary | ICD-10-CM

## 2022-10-03 MED ORDER — DIPHENHYDRAMINE HCL 25 MG PO CAPS: 25.0000 mg | ORAL_CAPSULE | Freq: Once | ORAL | Status: DC

## 2022-10-03 MED ORDER — ACETAMINOPHEN 325 MG PO TABS
650.0000 mg | ORAL_TABLET | Freq: Once | ORAL | Status: AC
  Administered 2022-10-03: 650 mg via ORAL
  Filled 2022-10-03: qty 2

## 2022-10-03 MED ORDER — SODIUM CHLORIDE 0.9 % IV SOLN
200.0000 mg | Freq: Once | INTRAVENOUS | Status: AC
  Administered 2022-10-03: 200 mg via INTRAVENOUS
  Filled 2022-10-03: qty 10

## 2022-10-03 NOTE — Progress Notes (Signed)
Diagnosis: Iron Deficiency Anemia  Provider:  Chilton Greathouse MD  Procedure: IV Infusion  IV Type: Peripheral, IV Location: R Antecubital  Venofer (Iron Sucrose), Dose: 200 mg  Infusion Start Time: 1038  Infusion Stop Time: 1115  Post Infusion IV Care: Observation period completed and Peripheral IV Discontinued  Discharge: Condition: Good, Destination: Home . AVS Declined  Performed by:  Garnette Czech, RN

## 2022-10-05 ENCOUNTER — Ambulatory Visit (INDEPENDENT_AMBULATORY_CARE_PROVIDER_SITE_OTHER): Payer: Federal, State, Local not specified - PPO

## 2022-10-05 VITALS — BP 112/60 | HR 72 | Temp 98.8°F | Resp 18 | Ht 63.0 in | Wt 109.4 lb

## 2022-10-05 DIAGNOSIS — R5383 Other fatigue: Secondary | ICD-10-CM | POA: Diagnosis not present

## 2022-10-05 DIAGNOSIS — D509 Iron deficiency anemia, unspecified: Secondary | ICD-10-CM

## 2022-10-05 DIAGNOSIS — E559 Vitamin D deficiency, unspecified: Secondary | ICD-10-CM | POA: Diagnosis not present

## 2022-10-05 DIAGNOSIS — R7989 Other specified abnormal findings of blood chemistry: Secondary | ICD-10-CM | POA: Diagnosis not present

## 2022-10-05 DIAGNOSIS — E039 Hypothyroidism, unspecified: Secondary | ICD-10-CM | POA: Diagnosis not present

## 2022-10-05 DIAGNOSIS — D500 Iron deficiency anemia secondary to blood loss (chronic): Secondary | ICD-10-CM

## 2022-10-05 MED ORDER — ACETAMINOPHEN 325 MG PO TABS: 650.0000 mg | ORAL_TABLET | Freq: Once | ORAL | Status: DC

## 2022-10-05 MED ORDER — DIPHENHYDRAMINE HCL 25 MG PO CAPS: 25.0000 mg | ORAL_CAPSULE | Freq: Once | ORAL | Status: DC

## 2022-10-05 MED ORDER — SODIUM CHLORIDE 0.9 % IV SOLN
200.0000 mg | Freq: Once | INTRAVENOUS | Status: AC
  Administered 2022-10-05: 200 mg via INTRAVENOUS
  Filled 2022-10-05: qty 10

## 2022-10-05 NOTE — Progress Notes (Signed)
Diagnosis: Iron Deficiency Anemia  Provider:  Chilton Greathouse MD  Procedure: IV Infusion  IV Type: Peripheral, IV Location: R Antecubital  Venofer (Iron Sucrose), Dose: 200 mg  Infusion Start Time: 1052  Infusion Stop Time: 1125  Post Infusion IV Care: Patient declined observation and Peripheral IV Discontinued  Discharge: Condition: Good, Destination: Home . AVS Declined  Performed by:  Garnette Czech, RN

## 2022-10-10 ENCOUNTER — Ambulatory Visit (INDEPENDENT_AMBULATORY_CARE_PROVIDER_SITE_OTHER): Payer: Federal, State, Local not specified - PPO

## 2022-10-10 VITALS — BP 122/76 | HR 69 | Temp 98.5°F | Resp 18 | Ht 63.0 in | Wt 108.6 lb

## 2022-10-10 DIAGNOSIS — D500 Iron deficiency anemia secondary to blood loss (chronic): Secondary | ICD-10-CM

## 2022-10-10 DIAGNOSIS — D509 Iron deficiency anemia, unspecified: Secondary | ICD-10-CM

## 2022-10-10 MED ORDER — SODIUM CHLORIDE 0.9 % IV SOLN
200.0000 mg | Freq: Once | INTRAVENOUS | Status: AC
  Administered 2022-10-10: 200 mg via INTRAVENOUS
  Filled 2022-10-10: qty 10

## 2022-10-10 MED ORDER — ACETAMINOPHEN 325 MG PO TABS: 650.0000 mg | ORAL_TABLET | Freq: Once | ORAL | Status: DC

## 2022-10-10 MED ORDER — DIPHENHYDRAMINE HCL 25 MG PO CAPS: 25.0000 mg | ORAL_CAPSULE | Freq: Once | ORAL | Status: DC

## 2022-10-10 NOTE — Progress Notes (Signed)
Diagnosis: Iron Deficiency Anemia  Provider:  Chilton Greathouse MD  Procedure: IV Infusion  IV Type: Peripheral, IV Location: R Antecubital  Venofer (Iron Sucrose), Dose: 200 mg  Infusion Start Time: 0942  Infusion Stop Time: 1016  Post Infusion IV Care: Patient declined observation and Peripheral IV Discontinued  Discharge: Condition: Good, Destination: Home . AVS Declined  Performed by:  Garnette Czech, RN

## 2022-10-12 DIAGNOSIS — Z0000 Encounter for general adult medical examination without abnormal findings: Secondary | ICD-10-CM | POA: Diagnosis not present

## 2022-10-12 DIAGNOSIS — M351 Other overlap syndromes: Secondary | ICD-10-CM | POA: Diagnosis not present

## 2022-10-26 ENCOUNTER — Other Ambulatory Visit (HOSPITAL_COMMUNITY): Payer: Self-pay

## 2022-10-26 ENCOUNTER — Other Ambulatory Visit: Payer: Self-pay

## 2022-10-29 DIAGNOSIS — H018 Other specified inflammations of eyelid: Secondary | ICD-10-CM | POA: Diagnosis not present

## 2022-10-29 DIAGNOSIS — M3501 Sicca syndrome with keratoconjunctivitis: Secondary | ICD-10-CM | POA: Diagnosis not present

## 2022-10-29 DIAGNOSIS — Z79899 Other long term (current) drug therapy: Secondary | ICD-10-CM | POA: Diagnosis not present

## 2022-11-05 ENCOUNTER — Other Ambulatory Visit (HOSPITAL_BASED_OUTPATIENT_CLINIC_OR_DEPARTMENT_OTHER): Payer: Self-pay

## 2022-11-05 MED ORDER — EPINEPHRINE 0.3 MG/0.3ML IJ SOAJ
INTRAMUSCULAR | 1 refills | Status: DC
  Filled 2022-11-05: qty 2, 30d supply, fill #0

## 2022-11-23 DIAGNOSIS — R918 Other nonspecific abnormal finding of lung field: Secondary | ICD-10-CM | POA: Diagnosis not present

## 2022-11-23 DIAGNOSIS — F418 Other specified anxiety disorders: Secondary | ICD-10-CM | POA: Diagnosis not present

## 2022-11-23 DIAGNOSIS — R911 Solitary pulmonary nodule: Secondary | ICD-10-CM | POA: Diagnosis not present

## 2022-11-23 DIAGNOSIS — Z2821 Immunization not carried out because of patient refusal: Secondary | ICD-10-CM | POA: Diagnosis not present

## 2022-11-30 ENCOUNTER — Telehealth: Payer: Federal, State, Local not specified - PPO | Admitting: Thoracic Surgery (Cardiothoracic Vascular Surgery)

## 2022-12-07 ENCOUNTER — Other Ambulatory Visit (HOSPITAL_COMMUNITY): Payer: Self-pay

## 2022-12-07 ENCOUNTER — Other Ambulatory Visit (HOSPITAL_BASED_OUTPATIENT_CLINIC_OR_DEPARTMENT_OTHER): Payer: Self-pay

## 2022-12-07 ENCOUNTER — Ambulatory Visit (INDEPENDENT_AMBULATORY_CARE_PROVIDER_SITE_OTHER): Payer: Federal, State, Local not specified - PPO | Admitting: Thoracic Surgery (Cardiothoracic Vascular Surgery)

## 2022-12-07 DIAGNOSIS — R911 Solitary pulmonary nodule: Secondary | ICD-10-CM

## 2022-12-07 NOTE — Progress Notes (Signed)
     [address]       [place] [address]             [phone]       Patient: Home Provider: Office Consent for Telemedicine visit obtained.  Today's visit was completed via a real-time telehealth (see specific modality noted below). The patient/authorized person provided oral consent at the time of the visit to engage in a telemedicine encounter with the present provider at [facility]. The patient/authorized person was informed of the potential benefits, limitations, and risks of telemedicine. The patient/authorized person expressed understanding that the laws that protect confidentiality also apply to telemedicine. The patient/authorized person acknowledged understanding that telemedicine does not provide emergency services and that he or she would need to call 911 or proceed to the nearest hospital for help if such a need arose.   Total time spent in the clinical discussion 10 minutes.  Telehealth Modality: Phone visit (audio only)  I had a telephone visit with Dr. Roda Shutters. Overall, she is doing well, without any significant changes.  She has a scan at [facility] on 10/25, and there has been slight growth from 6mm to 7.59mm.  The consistency of the nodule has also been stable.  She will undergo a surveillance scan in 6 months, and follow-up with [place] to review the images.  I explained to her that if the GGO grows over 8mm or has a solid component, we should strongly consider a surgical biopsy.  Carley Strickling Keane Scrape

## 2022-12-10 ENCOUNTER — Other Ambulatory Visit (HOSPITAL_BASED_OUTPATIENT_CLINIC_OR_DEPARTMENT_OTHER): Payer: Self-pay

## 2023-02-25 ENCOUNTER — Other Ambulatory Visit (HOSPITAL_COMMUNITY): Payer: Self-pay

## 2023-02-25 ENCOUNTER — Other Ambulatory Visit: Payer: Self-pay

## 2023-02-25 MED ORDER — CLOBETASOL PROPIONATE 0.05 % EX SOLN
1.0000 | Freq: Two times a day (BID) | CUTANEOUS | 3 refills | Status: AC
  Filled 2023-02-25: qty 50, 20d supply, fill #0

## 2023-02-25 MED ORDER — HYDROXYCHLOROQUINE SULFATE 200 MG PO TABS
400.0000 mg | ORAL_TABLET | Freq: Every day | ORAL | 3 refills | Status: AC
  Filled 2023-02-25 – 2024-02-12 (×3): qty 45, 30d supply, fill #0
  Filled 2024-02-12: qty 45, 30d supply, fill #1

## 2023-02-26 ENCOUNTER — Other Ambulatory Visit: Payer: Self-pay

## 2023-02-26 ENCOUNTER — Other Ambulatory Visit: Payer: Self-pay | Admitting: *Deleted

## 2023-02-26 ENCOUNTER — Other Ambulatory Visit (HOSPITAL_COMMUNITY): Payer: Self-pay

## 2023-02-26 DIAGNOSIS — E785 Hyperlipidemia, unspecified: Secondary | ICD-10-CM

## 2023-02-26 DIAGNOSIS — D509 Iron deficiency anemia, unspecified: Secondary | ICD-10-CM

## 2023-03-20 ENCOUNTER — Other Ambulatory Visit (HOSPITAL_COMMUNITY): Payer: Self-pay

## 2023-03-20 MED ORDER — MOXIFLOXACIN HCL 0.5 % OP SOLN
1.0000 [drp] | Freq: Two times a day (BID) | OPHTHALMIC | 1 refills | Status: DC
Start: 2023-03-20 — End: 2023-10-10
  Filled 2023-03-20: qty 3, 30d supply, fill #0

## 2023-04-15 ENCOUNTER — Other Ambulatory Visit: Payer: Self-pay | Admitting: Internal Medicine

## 2023-04-15 ENCOUNTER — Ambulatory Visit (INDEPENDENT_AMBULATORY_CARE_PROVIDER_SITE_OTHER): Payer: Federal, State, Local not specified - PPO

## 2023-04-15 DIAGNOSIS — I6523 Occlusion and stenosis of bilateral carotid arteries: Secondary | ICD-10-CM | POA: Diagnosis not present

## 2023-04-17 ENCOUNTER — Encounter (HOSPITAL_BASED_OUTPATIENT_CLINIC_OR_DEPARTMENT_OTHER): Payer: Self-pay | Admitting: Internal Medicine

## 2023-04-17 ENCOUNTER — Encounter: Payer: Self-pay | Admitting: Pharmacist

## 2023-04-17 ENCOUNTER — Other Ambulatory Visit (HOSPITAL_COMMUNITY): Payer: Self-pay

## 2023-04-17 ENCOUNTER — Other Ambulatory Visit: Payer: Self-pay

## 2023-04-17 MED ORDER — ROSUVASTATIN CALCIUM 20 MG PO TABS
20.0000 mg | ORAL_TABLET | Freq: Every day | ORAL | 3 refills | Status: DC
  Filled 2023-04-17 – 2023-11-04 (×3): qty 30, 30d supply, fill #0

## 2023-04-22 ENCOUNTER — Other Ambulatory Visit: Payer: Self-pay

## 2023-05-27 ENCOUNTER — Other Ambulatory Visit (HOSPITAL_BASED_OUTPATIENT_CLINIC_OR_DEPARTMENT_OTHER): Payer: Self-pay | Admitting: Internal Medicine

## 2023-05-27 ENCOUNTER — Other Ambulatory Visit: Payer: Self-pay

## 2023-05-27 ENCOUNTER — Other Ambulatory Visit (HOSPITAL_COMMUNITY): Payer: Self-pay

## 2023-05-27 ENCOUNTER — Encounter (HOSPITAL_BASED_OUTPATIENT_CLINIC_OR_DEPARTMENT_OTHER): Payer: Self-pay | Admitting: Internal Medicine

## 2023-05-27 DIAGNOSIS — E785 Hyperlipidemia, unspecified: Secondary | ICD-10-CM

## 2023-05-28 ENCOUNTER — Encounter (HOSPITAL_BASED_OUTPATIENT_CLINIC_OR_DEPARTMENT_OTHER): Payer: Self-pay | Admitting: Obstetrics & Gynecology

## 2023-05-28 LAB — IRON,TIBC AND FERRITIN PANEL
Ferritin: 31 ng/mL (ref 15–150)
Iron Saturation: 6 % — CL (ref 15–55)
Iron: 24 ug/dL — ABNORMAL LOW (ref 27–159)
Total Iron Binding Capacity: 394 ug/dL (ref 250–450)
UIBC: 370 ug/dL (ref 131–425)

## 2023-05-28 LAB — NMR, LIPOPROFILE
Cholesterol, Total: 216 mg/dL — ABNORMAL HIGH (ref 100–199)
HDL Particle Number: 35.8 umol/L (ref >=30.5)
HDL-C: 91 mg/dL (ref >39)
LDL Particle Number: 1037 nmol/L — ABNORMAL HIGH (ref <1000)
LDL Size: 22.1 nm (ref >20.5)
LDL-C (NIH Calc): 118 mg/dL — ABNORMAL HIGH (ref 0–99)
LP-IR Score: <25 (ref <=45)
Small LDL Particle Number: <90 nmol/L (ref <=527)
Triglycerides: 37 mg/dL (ref 0–149)

## 2023-05-28 LAB — CBC
Hematocrit: 30.3 % — ABNORMAL LOW (ref 34.0–46.6)
Hemoglobin: 9.4 g/dL — ABNORMAL LOW (ref 11.1–15.9)
MCH: 29.2 pg (ref 26.6–33.0)
MCHC: 31 g/dL — ABNORMAL LOW (ref 31.5–35.7)
MCV: 94 fL (ref 79–97)
Platelets: 173 10*3/uL (ref 150–450)
RBC: 3.22 x10E6/uL — ABNORMAL LOW (ref 3.77–5.28)
RDW: 12.9 % (ref 11.7–15.4)
WBC: 2.4 10*3/uL — CL (ref 3.4–10.8)

## 2023-05-28 LAB — HEPATIC FUNCTION PANEL
ALT: 20 IU/L (ref 0–32)
AST: 32 IU/L (ref 0–40)
Albumin: 4.5 g/dL (ref 3.8–4.9)
Alkaline Phosphatase: 70 IU/L (ref 44–121)
Bilirubin Total: 0.3 mg/dL (ref 0.0–1.2)
Bilirubin, Direct: 0.15 mg/dL (ref 0.00–0.40)
Total Protein: 6.9 g/dL (ref 6.0–8.5)

## 2023-05-28 LAB — CK: Total CK: 190 U/L — ABNORMAL HIGH (ref 32–182)

## 2023-05-29 ENCOUNTER — Other Ambulatory Visit (HOSPITAL_COMMUNITY): Payer: Self-pay

## 2023-05-29 ENCOUNTER — Encounter (HOSPITAL_BASED_OUTPATIENT_CLINIC_OR_DEPARTMENT_OTHER): Payer: Self-pay | Admitting: Internal Medicine

## 2023-05-29 ENCOUNTER — Telehealth: Payer: Self-pay

## 2023-05-29 ENCOUNTER — Ambulatory Visit (HOSPITAL_BASED_OUTPATIENT_CLINIC_OR_DEPARTMENT_OTHER): Admitting: Obstetrics & Gynecology

## 2023-05-29 ENCOUNTER — Encounter (HOSPITAL_BASED_OUTPATIENT_CLINIC_OR_DEPARTMENT_OTHER): Payer: Self-pay | Admitting: Obstetrics & Gynecology

## 2023-05-29 ENCOUNTER — Other Ambulatory Visit (HOSPITAL_BASED_OUTPATIENT_CLINIC_OR_DEPARTMENT_OTHER): Payer: Self-pay

## 2023-05-29 VITALS — BP 119/75 | HR 72 | Ht 63.0 in | Wt 107.2 lb

## 2023-05-29 DIAGNOSIS — R5383 Other fatigue: Secondary | ICD-10-CM

## 2023-05-29 DIAGNOSIS — N951 Menopausal and female climacteric states: Secondary | ICD-10-CM | POA: Diagnosis not present

## 2023-05-29 DIAGNOSIS — N920 Excessive and frequent menstruation with regular cycle: Secondary | ICD-10-CM | POA: Diagnosis not present

## 2023-05-29 DIAGNOSIS — D500 Iron deficiency anemia secondary to blood loss (chronic): Secondary | ICD-10-CM | POA: Diagnosis not present

## 2023-05-29 MED ORDER — NORETHINDRONE ACETATE 5 MG PO TABS
5.0000 mg | ORAL_TABLET | Freq: Every day | ORAL | 1 refills | Status: DC
  Filled 2023-05-29 (×2): qty 30, 30d supply, fill #0

## 2023-05-29 NOTE — Telephone Encounter (Signed)
 Hello,  Patient will be scheduled as soon as possible  Auth Submission: NO AUTH NEEDED Site of care: Site of care: [facility] Payer: [employer] Medication & CPT/J Code(s) submitted: Venofer  (Iron  Sucrose) J1756 Route of submission (phone, fax, portal): phone Phone #[phone] Fax # Auth type: Buy/Bill PB Units/visits requested: 200mg , 5 doses Reference number: [id number] Approval from: 05/29/23 to 11/29/23

## 2023-05-31 ENCOUNTER — Encounter (HOSPITAL_BASED_OUTPATIENT_CLINIC_OR_DEPARTMENT_OTHER): Payer: Self-pay | Admitting: Internal Medicine

## 2023-05-31 ENCOUNTER — Ambulatory Visit (INDEPENDENT_AMBULATORY_CARE_PROVIDER_SITE_OTHER)

## 2023-05-31 ENCOUNTER — Ambulatory Visit (INDEPENDENT_AMBULATORY_CARE_PROVIDER_SITE_OTHER): Admitting: Internal Medicine

## 2023-05-31 VITALS — BP 133/78 | HR 66 | Temp 98.3°F | Resp 18 | Ht 63.0 in | Wt 107.4 lb

## 2023-05-31 VITALS — BP 100/60 | HR 80 | Ht 63.0 in | Wt 106.0 lb

## 2023-05-31 DIAGNOSIS — E785 Hyperlipidemia, unspecified: Secondary | ICD-10-CM

## 2023-05-31 DIAGNOSIS — I6523 Occlusion and stenosis of bilateral carotid arteries: Secondary | ICD-10-CM | POA: Diagnosis not present

## 2023-05-31 DIAGNOSIS — D72819 Decreased white blood cell count, unspecified: Secondary | ICD-10-CM | POA: Diagnosis not present

## 2023-05-31 DIAGNOSIS — D509 Iron deficiency anemia, unspecified: Secondary | ICD-10-CM

## 2023-05-31 DIAGNOSIS — D500 Iron deficiency anemia secondary to blood loss (chronic): Secondary | ICD-10-CM | POA: Diagnosis not present

## 2023-05-31 MED ORDER — IRON SUCROSE 20 MG/ML IV SOLN: 200.0000 mg | Freq: Once | INTRAVENOUS | Status: DC

## 2023-05-31 MED ORDER — IRON SUCROSE 200 MG IVPB - SIMPLE MED
200.0000 mg | Freq: Once | Status: AC
Start: 2023-05-31 — End: 2023-05-31
  Administered 2023-05-31: 200 mg via INTRAVENOUS
  Filled 2023-05-31: qty 200

## 2023-05-31 NOTE — Progress Notes (Signed)
 GYNECOLOGY  VISIT  CC:   h/o menorrhagia, iron  def anemia, fatigue  HPI: 53 y.o. G53P0010 Married [place] female here for discussion of heavy vaginal bleeding.  Cycles are heavy and she has experienced issues with anemia and iron  deficiency.  Did undergo five iron  infusions last September and October and felt really well for several months.  Hb was 12.5 after infusions but was down to 9.4 on 05/27/2023.  She is feeling very fatigued.  Iron  and ferritin were 24 and 6, respectively.  Feel should proceed with iron  infusions again.  She is completely in agreement.  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  Has lumpectomy with lymph node removal in 2021.  Really would prefer not to have hysterectomy and feels she must be close to menopause at this point.  Just so tired of the fatigue.  Feeling SOB with walking/exertion.   Endometrial biopsy was normal 12/11/2021.  She is open to taking progesterone  for helping with bleeding.  Did not have tumor marker testing that I could see.  Will reach out to Dr. Delane Fear to ensure he is in agreement with plan.  We did discuss endometrial ablation as well. She is open to this and knows there is a 40% change of amenorrhea, 10% failure rate and typically about 80% decrease in bleeding.  She is also interested in knowing how close she may be to menopause.  AMH testing discussed today.  Did have colonoscopy in 2021.  Normal.  Follow up 10 years.  Last pap smear 07/12/2020.     Past Medical History:  Diagnosis Date   ANA positive    ?Sjogrens   Breast neoplasm, Tis (LCIS), left    Cholecystitis    Cholelithiasis    Dry eye    Fibroid    Gall stones    GERD (gastroesophageal reflux disease)    Hypothyroidism    Takes synthroid    Infertility, female    Keratitis 2020   Lactose intolerance    Leukopenia    Sjogren's disease (HCC)    Thrombocytopenia (HCC)    Urticaria    episodes since childhood    Vitamin D  deficiency disease    Takes Vit D   Weight loss     MEDS:    Current Outpatient Medications on File Prior to Visit  Medication Sig Dispense Refill   clobetasol  (TEMOVATE ) 0.05 % external solution Apply 1 Application topically 2 (two) times daily. 50 mL 3   hydroxychloroquine  (PLAQUENIL ) 200 MG tablet Take 2 tablets (400mg ) by mouth every other day, alternating with 1 tablet every other day. 135 tablet 3   moxifloxacin  (VIGAMOX ) 0.5 % ophthalmic solution Place 1 drop into the left eye 2 (two) times daily. 3 mL 1   rosuvastatin  (CRESTOR ) 20 MG tablet Take 1 tablet (20 mg total) by mouth daily. 90 tablet 3   clobetasol  (TEMOVATE ) 0.05 % external solution Apply topically 2 (two) times daily 50 mL 1   clobetasol  (TEMOVATE ) 0.05 % external solution Apply topically 2 (two) times daily. 50 mL 3   cycloSPORINE , PF, (CEQUA ) 0.09 % SOLN Place 1 drop into both eyes 2 (two) times daily. 180 each 3   EPINEPHrine  (EPIPEN  2-PAK) 0.3 mg/0.3 mL IJ SOAJ injection Use as directed as directed 2 each 1   hydroxychloroquine  (PLAQUENIL ) 200 MG tablet TAKE 2 TABLETS BY MOUTH EVERY OTHER DAY ALTERNATING WITH 1 TABLET EVERY OTHER DAY. 135 tablet 3   UNABLE TO FIND Place 1 drop into both eyes 6 (six) times daily. Plasma reach  growth factor Platelet rich growth factor eye drops     VITAMIN D  PO Take by mouth. Takes a few times a month     No current facility-administered medications on file prior to visit.    ALLERGIES: Lactose intolerance (gi), Lifitegrast, Latex, Systane balance [propylene glycol], Tape, and Wound dressing adhesive  SH:  married, non smoker  Review of Systems  Constitutional:  Positive for malaise/fatigue.  Respiratory:  Positive for shortness of breath (with exertion).   Cardiovascular:  Negative for chest pain and palpitations.  Genitourinary:        Menorrhagia    PHYSICAL EXAMINATION:    BP 119/75 (BP Location: Right Arm, Patient Position: Sitting)   Pulse 72   Ht 5\' 3"  (1.6 m)   Wt 107 lb 3.2 oz (48.6 kg)   BMI 18.99 kg/m     Physical  Exam Constitutional:      Appearance: Normal appearance.  Neurological:     General: No focal deficit present.     Mental Status: She is alert.  Psychiatric:        Mood and Affect: Mood normal.      Assessment/Plan: 1. Menorrhagia with regular cycle (Primary) - treatment options discussed.  Will start norethindrone  5mg  daily.  Rx to pharmacy. - additional treatment with endometrial ablation, hysterectomy discussed  2. Iron  deficiency anemia due to chronic blood loss - iron  infusions orders placed for 5 doses of venofer  200mg  IV  3. Menopausal symptoms - will check AMH level - Anti mullerian hormone  4. Other fatigue

## 2023-05-31 NOTE — Progress Notes (Signed)
 LIPID CLINIC CONSULT NOTE  Chief Complaint:  Follow-up  Primary Care Physician: Margarete Sharps, MD  Primary Cardiologist:  None  HPI:  Sarah [name] is a 53 y.o. female who is being seen today for dyslipidemia (self-referred). 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  She reports recent work-up for potential TIA which involved a CT angiogram of the head and neck that demonstrated mild atherosclerotic changes at the carotid bifurcations as well as aortic atherosclerosis.  Prior to that in May 2022 she underwent calcium  scoring which showed no coronary calcification.  There was however mild calcification of the descending aorta.  As part of her work-up she saw Dr. Julane Ny who ordered a monitor and echo with bubble study.  The bubble study was negative which showed normal LV function.  The monitor showed 3 episodes of SVT.  She says she has them intermittently and sometimes a little more sustained.  She is not on medication for this.  She had a lipid NMR through her primary care provider in July showing total LDL particle number of 1291, LDL-C of 111, HDL-C of 63, triglycerides 110 and a small LDL particle number was low at 259.  LDL size is large at 21.8 Angstrom.  I reviewed those results with her today.  Subsequent to this she was started on rosuvastatin  5 mg twice weekly.  She has not had repeat lipids.  She does report that her mother has some carotid atherosclerosis however she is in her 42s.  She reports recent significant changes in her diet including increasing fish, decreasing meats and more vegetables, less sugar and more exercise.  04/20/2022  Wildred returns today for follow-up.  Overall she is doing well on rosuvastatin .  She has had further decrease in her lipids.  Her LDL particle number is now 410 with an LDL-C of 41, triglycerides 127 and HDL 75.  Small LDL particle is very low at 163.  She has no overt side effects  from the medication.  She said recently she had dysfunctional uterine bleeding which is unusual for her but I think it is unlikely to be the statin although she did find some case reports of this.  She also is 53 years old and this may be normal.  She has not had repeat carotid imaging although she was noted to have chronic atherosclerosis on CT angiography.  05/31/2023  Jiayi is seen today for follow-up.  She is not feeling all that well.  She says she is very fatigued.  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  We did some recent lab work that she requested.  Her cholesterol is gone up substantially with an LDL particle #2037, LDL 118, HDL 91 and triglycerides 37.  This is in response to stopping her statin for the past 2 months because she felt unwell.  Surprisingly her CK went up somewhat to 190, previously 149.  Her CBC is abnormal with a white count of 2400 and anemia with low red count and hemoglobin and hematocrit of 9.4 and 30.3.  Platelets were 173 but have been previously low in the 90s and 100s.  Repeat iron  studies showed normal TIBC but low iron  level 24, low Tran sat at 6% and normal ferritin.  She apparently is having some heavy periods and her GYN is discussing possible medication suppression of this or ablation versus hysterectomy.  I am concerned however that she has 3 cell lines suppression  including leukopenia and thrombocytopenia.  She says in the past she saw a hematologist once but did not have a bone marrow biopsy.  She is also on hydroxychloroquine , which can lead to bone marrow suppression.   PMHx:  Past Medical History:  Diagnosis Date   ANA positive    ?Sjogrens   Breast neoplasm, Tis (LCIS), left    Cholecystitis    Cholelithiasis    Dry eye    Fibroid    Gall stones    GERD (gastroesophageal reflux disease)    Hypothyroidism    Takes synthroid    Infertility, female    Keratitis 2020   Lactose intolerance    Leukopenia    Sjogren's disease  (HCC)    Thrombocytopenia (HCC)    Urticaria    episodes since childhood    Vitamin D  deficiency disease    Takes Vit D   Weight loss     Past Surgical History:  Procedure Laterality Date   CHOLECYSTECTOMY N/A 07/13/2014   Procedure: LAPAROSCOPIC CHOLECYSTECTOMY WITH INTRAOPERATIVE CHOLANGIOGRAM;  Surgeon: Enid Harry, MD;  Location: [facility] OR;  Service: General;  Laterality: N/A;   egg retrieval  2008; 2011; 2012   multiple IVF cycles   ENDOMETRIAL BIOPSY     SIMPLE MASTECTOMY WITH AXILLARY SENTINEL NODE BIOPSY Bilateral 07/15/2019   Procedure: RIGHT RISK REDUCING MASTECTOMY AND LEFT MASTECTOMY  WITH LEFT AXILLARY SENTINEL NODE BIOPSY;  Surgeon: Enid Harry, MD;  Location: [facility] OR;  Service: General;  Laterality: Bilateral;  PEC BLOCK   WISDOM TOOTH EXTRACTION      FAMHx:  Family History  Problem Relation Age of Onset   Lupus Sister    Thyroid  disease Sister    Thyroid  disease Mother     SOCHx:   reports that she has never smoked. She has never used smokeless tobacco. She reports that she does not drink alcohol  and does not use drugs.  ALLERGIES:  Allergies  Allergen Reactions   Lactose Intolerance (Gi) Diarrhea    Stomach pain   Lifitegrast Other (See Comments)    corneal lesion   Latex Rash   Systane Balance [Propylene Glycol] Rash   Tape Rash   Wound Dressing Adhesive Rash    ROS: Pertinent items noted in HPI and remainder of comprehensive ROS otherwise negative.  HOME MEDS: Current Outpatient Medications on File Prior to Visit  Medication Sig Dispense Refill   clobetasol  (TEMOVATE ) 0.05 % external solution Apply topically 2 (two) times daily 50 mL 1   clobetasol  (TEMOVATE ) 0.05 % external solution Apply topically 2 (two) times daily. 50 mL 3   clobetasol  (TEMOVATE ) 0.05 % external solution Apply 1 Application topically 2 (two) times daily. 50 mL 3   cycloSPORINE , PF, (CEQUA ) 0.09 % SOLN Place 1 drop into both eyes 2 (two) times daily. 180 each 3    EPINEPHrine  (EPIPEN  2-PAK) 0.3 mg/0.3 mL IJ SOAJ injection Use as directed as directed 2 each 1   hydroxychloroquine  (PLAQUENIL ) 200 MG tablet TAKE 2 TABLETS BY MOUTH EVERY OTHER DAY ALTERNATING WITH 1 TABLET EVERY OTHER DAY. 135 tablet 3   hydroxychloroquine  (PLAQUENIL ) 200 MG tablet Take 2 tablets (400mg ) by mouth every other day, alternating with 1 tablet every other day. 135 tablet 3   moxifloxacin  (VIGAMOX ) 0.5 % ophthalmic solution Place 1 drop into the left eye 2 (two) times daily. 3 mL 1   norethindrone  (AYGESTIN ) 5 MG tablet Take 1 tablet (5 mg total) by mouth daily. 30 tablet 1   rosuvastatin  (CRESTOR ) 20  MG tablet Take 1 tablet (20 mg total) by mouth daily. 90 tablet 3   UNABLE TO FIND Place 1 drop into both eyes 6 (six) times daily. Plasma reach growth factor Platelet rich growth factor eye drops     VITAMIN D  PO Take by mouth. Takes a few times a month     No current facility-administered medications on file prior to visit.    LABS/IMAGING: No results found for this or any previous visit (from the past 48 hours). No results found.  LIPID PANEL:    Component Value Date/Time   CHOL 191 07/17/2018 1333   CHOL 217 (H) 09/12/2016 0941   TRIG 167.0 (H) 07/17/2018 1333   HDL 53.70 07/17/2018 1333   HDL 67 09/12/2016 0941   CHOLHDL 4 07/17/2018 1333   VLDL 33.4 07/17/2018 1333   LDLCALC 104 (H) 07/17/2018 1333   LDLCALC 125 (H) 09/12/2016 0941    WEIGHTS: Wt Readings from Last 3 Encounters:  05/31/23 106 lb (48.1 kg)  05/29/23 107 lb 3.2 oz (48.6 kg)  10/10/22 108 lb 9.6 oz (49.3 kg)    VITALS: BP 100/60   Pulse 80   Ht 5\' 3"  (1.6 m)   Wt 106 lb (48.1 kg)   SpO2 97%   BMI 18.78 kg/m   EXAM: General appearance: alert and no distress Lungs: clear to auscultation bilaterally Heart: regular rate and rhythm, S1, S2 normal, no murmur, click, rub or gallop Extremities: extremities normal, atraumatic, no cyanosis or edema Neurologic: Grossly  normal  EKG: Deferred  ASSESSMENT: Mixed dyslipidemia, goal LDL less than 70 Carotid and aortic atherosclerosis Zero coronary calcium  (05/2020) Iron  deficiency anemia Leukopenia, as well as thrombocytopenia  PLAN: 1.   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  I think this is mostly due to anemia but she also has leukopenia and recently had some thrombocytopenia as well.  She does appear to have iron  deficiency and says she has been having heavy periods and this is being addressed by her GYN.  She is also scheduled for iron  infusions.  I suggested that she see a hematologist because of 3 cell line involvement and it may be necessary for her ultimately to get a bone marrow biopsy.  I also advised her to talk with her rheumatologist about the hydroxychloroquine  to see if this could be playing a role in her low cell counts.  She could stay off of her statin for some time if she feels it is causing side effects however she said she would consider going back on it maybe every other day as she did have much improved lipids.  She did have recent repeat carotid Dopplers which showed mild bilateral carotid disease.  Follow-up with me in 6 months or sooner as necessary.  Hazle Lites, MD, [facility], FNLA, FACP  [place]  [facility]  Medical Director of the Advanced Lipid Disorders &  Cardiovascular Risk Reduction Clinic Diplomate of the American Board of Clinical Lipidology Attending Cardiologist  Direct Dial: [phone]  Fax: [phone]  Website:  [email or web address] 05/31/2023, 8:13 AM

## 2023-05-31 NOTE — Patient Instructions (Addendum)
 Medication Instructions:  Your physician recommends that you continue on your current medications as directed. Please refer to the Current Medication list given to you today.  *If you need a refill on your cardiac medications before your next appointment, please call your pharmacy*  Lab Work: FASTING NMR Lipoprofile in  6 months (before f/u appt) If you have labs (blood work) drawn today and your tests are completely normal, you will receive your results only by: MyChart Message (if you have MyChart) OR A paper copy in the mail If you have any lab test that is abnormal or we need to change your treatment, we will call you to review the results.  Follow-Up: At [facility], you and your health needs are our priority.  As part of our continuing mission to provide you with exceptional heart care, our providers are all part of one team.  This team includes your primary Cardiologist (physician) and Advanced Practice Providers or APPs (Physician Assistants and Nurse Practitioners) who all work together to provide you with the care you need, when you need it.  Your next appointment:   6 month(s)  Provider:   Dr. Maximo Spar  We recommend signing up for the patient portal called "MyChart".  Sign up information is provided on this After Visit Summary.  MyChart is used to connect with patients for Virtual Visits (Telemedicine).  Patients are able to view lab/test results, encounter notes, upcoming appointments, etc.  Non-urgent messages can be sent to your provider as well.   To learn more about what you can do with MyChart, go to [email or web address].

## 2023-05-31 NOTE — Progress Notes (Signed)
 Diagnosis: Iron  Deficiency Anemia  Provider:  Praveen Mannam MD  Procedure: IV Infusion  IV Type: Peripheral, IV Location: R Antecubital  Venofer  (Iron  Sucrose), Dose: 200 mg  Infusion Start Time: 1442  Infusion Stop Time: 1516  Post Infusion IV Care: Observation period completed and Peripheral IV Discontinued  Discharge: Condition: Good, Destination: Home . AVS Declined  Performed by:  Espiridion Supinski, RN

## 2023-06-03 ENCOUNTER — Ambulatory Visit (INDEPENDENT_AMBULATORY_CARE_PROVIDER_SITE_OTHER)

## 2023-06-03 VITALS — BP 118/73 | HR 63 | Temp 97.8°F | Resp 18 | Ht 63.0 in | Wt 106.6 lb

## 2023-06-03 DIAGNOSIS — D509 Iron deficiency anemia, unspecified: Secondary | ICD-10-CM

## 2023-06-03 DIAGNOSIS — D500 Iron deficiency anemia secondary to blood loss (chronic): Secondary | ICD-10-CM

## 2023-06-03 MED ORDER — IRON SUCROSE 200 MG IVPB - SIMPLE MED
200.0000 mg | Freq: Once | Status: AC
  Administered 2023-06-03: 200 mg via INTRAVENOUS
  Filled 2023-06-03: qty 110
  Filled 2023-06-03: qty 200

## 2023-06-03 NOTE — Progress Notes (Signed)
 Diagnosis: Iron  Deficiency Anemia  Provider:  Praveen Mannam MD  Procedure: IV Infusion  IV Type: Peripheral, IV Location: R Antecubital  Venofer  (Iron  Sucrose), Dose: 200 mg  Infusion Start Time: 1412  Infusion Stop Time: 1443  Post Infusion IV Care: Observation period completed and Peripheral IV Discontinued  Discharge: Condition: Good, Destination: Home . AVS Declined  Performed by:  Javen Ridings, RN

## 2023-06-05 ENCOUNTER — Ambulatory Visit

## 2023-06-06 LAB — ANTI MULLERIAN HORMONE: ANTI-MULLERIAN HORMONE (AMH): <0.015 ng/mL

## 2023-06-07 ENCOUNTER — Ambulatory Visit

## 2023-06-07 NOTE — Progress Notes (Signed)
 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    In review, she was first seen by Dr. Melvinia in November 2022, and surveillance was recommended. She saw Dr. Romelle in April 2023 and was referred to thoracic surgery. When we met her in June 2023, the nodule was stable-to-minimally-enlarged, and we recommended a 1-year follow up. We discussed at length that its lack of a solid component currently makes it low-risk, and the risks of surgery outweigh the benefits. We discussed that this may change over time and that surgery may be considered in the future if the nodule were to continue to increase in diameter or density.   Since the last thoracic surgery clinic visit, she has been doing fairly well in general. 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 This has led to a significant drop in hemoglobin levels from 12 to 9. She is currently receiving intravenous iron  infusions to manage the anemia.  She describes feeling fatigued and experiences chest tightness and a pounding headache when walking at her regular pace from her office to the parking garage, necessitating her to slow down to reach her car. She reports labored breathing associated with her anemia.  A recent lung function test showed a decrease in her DLCO from 93.8% two years ago to 74% currently. She is unsure of the implications  of this change.  She denies any recent illnesses or surgeries since her last visit. Her bowel movements are regular. She is currently working at 75% of full time.  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 She is here today by herself.  Smoking Status:  Never smoked cigarettes  GLP-1/SGLT-2 status: None   Allergies   Allergies  Allergen Reactions  . Chromium And Derivatives Other (See Comments)    Potassium Dichromate - Positive patch test  . Coco Glucoside Other (See Comments)    Positive patch test  . Decyl Glucoside Other (See Comments)    Positive patch test  . Gold Sodium Thiosulfate Other (See Comments)    Positive patch test  . Lactose Diarrhea and Other (See Comments)  . Latex Other (See Comments)    Skin irritation  . Lifitegrast Other (See Comments)    corneal lesion  . Limonene Other (See Comments)    Positive patch test  . Linalool Other (See Comments)    Positive patch test  . Methylisothiazolinone Other (See Comments)    Positive patch test  . Nickel Sulfate Other (See Comments)    Positive patch test  . Other Other (See Comments)    1,2-benzisothiazolin-3-one - Positive patch test amidoamine - Positive patch test   . Others Unknown    LACTOSE INTOLERANT. LACTOSE INTOLERANT  . Propylene Glycol Other (See Comments)    Positive patch test  . Adhesive Rash  . Wound Dressings Rash     Medications   Current Outpatient Medications  Medication Sig Dispense Refill  . ascorbic acid (  VITAMIN C ORAL) Take by mouth Infrequently    . CEQUA  0.09 % Dpet     . clobetasoL  (CORMAX ) 0.05 % external solution Apply topically 2 (two) times daily 50 mL 3  . EPINEPHrine  (EPIPEN ) 0.3 mg/0.3 mL auto-injector Use as directed as directed    . ergocalciferol , vitamin D2, 1,250 mcg (50,000 unit) capsule 1 capsule    . hydroxychloroquine  (PLAQUENIL ) 200 mg tablet TAKE 2 TABLETS BY MOUTH EVERY OTHER DAY ALTERNATING WITH 1 TABLET EVERY OTHER DAY. 135 tablet 3  . MIEBO, PF, 100 % Drop  INSTILL ONE DROP INTO EACH EYE FOUR TIMES DAILY    . plasma rich growth factor ophthalmic solution Place 1 drop into both eyes 4 (four) times daily 11 mL 11  . rosuvastatin  (CRESTOR ) 20 MG tablet Take 20 mg by mouth once daily    . VITAMIN B COMPLEX ORAL Take by mouth Daily     No current facility-administered medications for this visit.     OBJECTIVE   Vitals:  Vitals:   06/07/23 1326  BP: (!) 152/80  Pulse: 79  Resp: 12  Temp: 36.8 C (98.2 F)  TempSrc: Oral  SpO2: 100%  Weight: 48.2 kg (106 lb 4.2 oz)   Wt Readings from Last 10 Encounters:  06/07/23 48.2 kg (106 lb 4.2 oz)  05/24/23 48.9 kg (107 lb 12.9 oz)  02/25/23 49.4 kg (108 lb 14.5 oz)  11/23/22 48.6 kg (107 lb 2.3 oz)  08/06/22 48.7 kg (107 lb 5.8 oz)  05/30/22 50.3 kg (110 lb 14.3 oz)  05/25/22 51 kg (112 lb 7 oz)  01/08/22 52.4 kg (115 lb 8.3 oz)  11/21/21 52.6 kg (116 lb)  07/14/21 50.6 kg (111 lb 8.8 oz)   ECOG Performance Scale:  1 - Restricted in physically strenuous activity but ambulatory and able to carry our work of a light or sedentary nature, e.g., light house work, office work  (currently anemic)  Physical Exam  General: well developed, well nourished, and in no acute distress Neurologic: non-focal exam Psych: oriented to time, place, and person; mood and affect are within normal limits; pt is a good historian; no memory problems were noted Skin: no rashes or lesions noted on exposed skin   Neck: Supple; no masses, no tenderness Lymph Nodes: No palpable cervical and supraclavicular lymph nodes Heart: regular rate and rhythm; S1 and S2 present; no murmur, rub, or gallop Breast/Chest Wall: deferred Lungs: clear to ausculatation bilaterally; no rhonchi, wheezing, or crackles GI/Abdomen: nondistended  Extremities: radial pulses 2+ bilaterally; no edema   DIAGNOSTIC STUDIES  The following studies were personally reviewed in clinic today:   CT chest without contrast 05/24/2023: FINDINGS:    LUNGS/AIRWAYS:   Central airways are patent. Stable 8 mm groundglass right lower lobe pulmonary nodule compared to 11/23/2022 . Adjacent 4 mm right lower lobe groundglass nodule is stable dating back to 01/01/2020. No new pulmonary nodules.   PLEURA:    No pleural effusion or pneumothorax.   MEDIASTINUM/HEART/VESSELS: :    Normal caliber thoracic aorta. Mild atherosclerotic calcifications. No pericardial effusion. No mediastinal or hilar lymphadenopathy. Mild coronary arterial calcifications.   VISIBLE ABDOMEN:    Status post cholecystectomy. Unchanged dilation of the common bile duct that most likely reflects reservoir effect.   BONES/SOFT TISSUES/OTHER:    No aggressive osseous lesions.   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 Recommend continued surveillance.   CT chest without contrast 11/23/2022: IMPRESSION:    Redemonstrated right lower lobe groundglass nodule, which appears stable from April 2024 but has increased by at least 1 mm compared to May 2022. Continue CT surveillance is recommended if tissue sampling is not anticipated. In addition to the above recommendations, consider referral to the [facility] for management [phone] - Ambulatory Referral to Lung Nodule Clinic).   IMPRESSION AND PLAN     ICD-10-CM   1. Pulmonary nodule  R91.1 CT chest without contrast with 3D MIPS Protocol    Return Visit Appointment Request      Annella Prowell is a 53 y.o. female with a history of small GGOs of the right lung. Both been grossly stable since they were first seen in May 2021. The 4mm one more central was there previously, but not noted upon.  It is really quite subtle. We spent a good bit of time today reviewing old images and discussing the natural history of these GGO's.   They are possibly on the spectrum of AIS/MIA/Adeno.  We talked about biopsies, which would be impossible for the 14m and technically quite difficulty for the 8mm one. Both appear to be stable over the last 3-4 years (maybe 1mm larger?) and without any solid component developing.   Evaluation today indicates she has a stable scan. Clinically, she is struggling with fatigue secondary to anemia. We discussed that her DLCO was likely lower today because of the anemia.   Results of the above studies were discussed with the patient.  We recommend continued lung nodule surveillance and would like the patient to return to clinic in 6 months for a CT scan and visit with Dr. Grover team. We discussed that we will plan to follow at 66-month intervals for up to 5 years from the time that the nodules were first seen. After that, we will extend her surveillance to yearly.  We also reviewed resection, highlighting that the removal of both GGO's would likely require a lobectomy and that even with localization techniques the larger, more superficial one may be difficult to locate and resect precisely with a wedge resection.    I personally reviewed the radiographic studies and evaluated the patient.  I discussed the plan above with the patient.  Analiah Drum expressed an understanding of this discussion and recommendations above.  All questions were answered to her satisfaction.  Attestation Statement:   I personally saw the patient and performed a substantive portion of the medical decision making, in conjunction with the Advanced Practice Provider for the condition/treatment of lung nodules, pure GGO's  complex medical and surgical decision making.  Donnice Carmin Moris, MD  Clinical Notes on My Chart: Progress notes documented by your healthcare team are available on my chart portal. We encourage you to review notes after visits and in preparation for upcoming appointments. This provides the opportunity to review  recommendations as well as prepare questions for your healthcare team to address during your next visit. If you have specific questions related to the notes, please bring them to your next scheduled visit to discuss with your physician, nurse practitioner or physician assistant. With increased transparency, our hope is that we can create better communication, more shared decision-making, and increased satisfaction. However, if you have questions regarding the plan of care, these may be managed by calling our office or through MyChart.  Patient Care Team: [facility], [facility] as PCP - General

## 2023-06-10 ENCOUNTER — Ambulatory Visit

## 2023-06-10 VITALS — BP 112/70 | HR 64 | Temp 98.0°F | Resp 14 | Ht 63.0 in | Wt 108.0 lb

## 2023-06-10 DIAGNOSIS — D509 Iron deficiency anemia, unspecified: Secondary | ICD-10-CM | POA: Diagnosis not present

## 2023-06-10 DIAGNOSIS — D500 Iron deficiency anemia secondary to blood loss (chronic): Secondary | ICD-10-CM

## 2023-06-10 MED ORDER — IRON SUCROSE 200 MG IVPB - SIMPLE MED
200.0000 mg | Freq: Once | Status: AC
Start: 2023-06-10 — End: 2023-06-10
  Administered 2023-06-10: 200 mg via INTRAVENOUS
  Filled 2023-06-10: qty 200

## 2023-06-10 NOTE — Progress Notes (Signed)
 Diagnosis: Iron  Deficiency Anemia  Provider:  Mannam, Praveen MD  Procedure: IV Infusion  IV Type: Peripheral, IV Location: R Antecubital  Venofer  (Iron  Sucrose), Dose: 200 mg  Infusion Start Time: 1411 pm  Infusion Stop Time: 1445 pm  Post Infusion IV Care: Observation period completed and Peripheral IV Discontinued  Discharge: Condition: Good, Destination: Home . AVS Declined  Performed by:  Lendel Quant, RN

## 2023-06-14 ENCOUNTER — Ambulatory Visit

## 2023-06-14 VITALS — BP 121/72 | HR 65 | Temp 97.3°F | Resp 18 | Ht 63.0 in | Wt 106.2 lb

## 2023-06-14 DIAGNOSIS — D509 Iron deficiency anemia, unspecified: Secondary | ICD-10-CM

## 2023-06-14 DIAGNOSIS — D500 Iron deficiency anemia secondary to blood loss (chronic): Secondary | ICD-10-CM

## 2023-06-14 MED ORDER — IRON SUCROSE 200 MG IVPB - SIMPLE MED
200.0000 mg | Freq: Once | Status: AC
  Administered 2023-06-14: 200 mg via INTRAVENOUS
  Filled 2023-06-14: qty 200

## 2023-06-14 NOTE — Progress Notes (Signed)
 Diagnosis: Iron  Deficiency Anemia  Provider:  Praveen Mannam MD  Procedure: IV Infusion  IV Type: Peripheral, IV Location: R Antecubital  Venofer  (Iron  Sucrose), Dose: 200 mg  Infusion Start Time: 1429  Infusion Stop Time: 1459  Post Infusion IV Care: Observation period completed  Discharge: Condition: Good, Destination: Home . AVS Declined  Performed by:  Rachelle Bue, RN

## 2023-06-17 ENCOUNTER — Ambulatory Visit

## 2023-06-17 VITALS — BP 115/77 | HR 66 | Temp 98.0°F | Resp 14 | Ht 63.0 in | Wt 108.0 lb

## 2023-06-17 DIAGNOSIS — D509 Iron deficiency anemia, unspecified: Secondary | ICD-10-CM

## 2023-06-17 DIAGNOSIS — D500 Iron deficiency anemia secondary to blood loss (chronic): Secondary | ICD-10-CM

## 2023-06-17 MED ORDER — IRON SUCROSE 200 MG IVPB - SIMPLE MED
200.0000 mg | Freq: Once | Status: AC
  Administered 2023-06-17: 200 mg via INTRAVENOUS
  Filled 2023-06-17: qty 200

## 2023-06-17 NOTE — Progress Notes (Signed)
 Diagnosis: Iron  Deficiency Anemia  Provider:  Mannam, Praveen MD  Procedure: IV Infusion  IV Type: Peripheral, IV Location: R Antecubital  Venofer  (Iron  Sucrose), Dose: 200 mg  Infusion Start Time: 1433  Infusion Stop Time: 1448  Post Infusion IV Care: Observation period completed and Peripheral IV Discontinued  Discharge: Condition: Good, Destination: Home . AVS Declined  Performed by:  Lendel Quant, RN

## 2023-06-26 ENCOUNTER — Encounter (HOSPITAL_BASED_OUTPATIENT_CLINIC_OR_DEPARTMENT_OTHER): Payer: Federal, State, Local not specified - PPO | Admitting: Internal Medicine

## 2023-06-27 ENCOUNTER — Other Ambulatory Visit (HOSPITAL_COMMUNITY): Payer: Self-pay

## 2023-06-27 MED ORDER — HYDROXYCHLOROQUINE SULFATE 200 MG PO TABS
400.0000 mg | ORAL_TABLET | Freq: Every day | ORAL | 1 refills | Status: DC
  Filled 2023-06-27: qty 60, 30d supply, fill #0
  Filled 2023-10-01 (×2): qty 60, 30d supply, fill #1
  Filled 2023-11-04: qty 60, 30d supply, fill #2
  Filled 2023-11-04: qty 60, 30d supply, fill #0

## 2023-06-28 ENCOUNTER — Other Ambulatory Visit (HOSPITAL_COMMUNITY): Payer: Self-pay

## 2023-06-28 ENCOUNTER — Ambulatory Visit
Attending: Thoracic Surgery (Cardiothoracic Vascular Surgery) | Admitting: Thoracic Surgery (Cardiothoracic Vascular Surgery)

## 2023-06-28 VITALS — BP 118/72 | HR 83 | Resp 18 | Ht 63.0 in | Wt 107.0 lb

## 2023-06-28 DIAGNOSIS — R911 Solitary pulmonary nodule: Secondary | ICD-10-CM

## 2023-06-28 NOTE — Progress Notes (Signed)
      [address]       [place] [address]             [phone]        Sarah [name] [facility] Medical Record #[id number] Date of Birth: 1970/10/26  Referring: Margarete Sharps, MD Primary Care: Margarete Sharps, MD Primary Cardiologist:None  Reason for visit:   follow-up  History of Present Illness:     Sarah [name] present for in follow-up to review her CT chest.  She has no new complaints.  She has gone back to hospital medicine at [facility].    Physical Exam: BP 118/72   Pulse 83   Resp 18   Ht 5\' 3"  (1.6 m)   Wt 107 lb (48.5 kg)   SpO2 96%   BMI 18.95 kg/m   Alert NAD EWOB Regular rate    Diagnostic Studies & Laboratory data: CT chest LUNGS/AIRWAYS:   Central airways are patent. Stable 8 mm groundglass right lower lobe  pulmonary nodule compared to 11/23/2022 . Adjacent 4 mm right lower lobe  groundglass nodule is stable dating back to 01/01/2020. No new pulmonary  nodules.      Assessment / Plan:   53 y.o. 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  She continue to get her scans at [facility] every 6 months.  Based on it location, wedge resection would be challenging.  She remains hesitant to proceed with any intervention at this point.  I have recommended continued surveillance, but if this changes or continues to grow, bronchoscopic biopsy, followed by lobectomy would be warranted.   Hilarie Lovely 06/28/2023 12:09 PM

## 2023-06-30 ENCOUNTER — Encounter (HOSPITAL_BASED_OUTPATIENT_CLINIC_OR_DEPARTMENT_OTHER): Payer: Self-pay | Admitting: Obstetrics & Gynecology

## 2023-07-03 ENCOUNTER — Ambulatory Visit (HOSPITAL_BASED_OUTPATIENT_CLINIC_OR_DEPARTMENT_OTHER): Payer: Self-pay | Admitting: Obstetrics & Gynecology

## 2023-07-17 ENCOUNTER — Other Ambulatory Visit (HOSPITAL_BASED_OUTPATIENT_CLINIC_OR_DEPARTMENT_OTHER): Payer: Self-pay | Admitting: Obstetrics & Gynecology

## 2023-07-17 ENCOUNTER — Encounter (HOSPITAL_BASED_OUTPATIENT_CLINIC_OR_DEPARTMENT_OTHER): Payer: Self-pay | Admitting: Obstetrics & Gynecology

## 2023-07-17 ENCOUNTER — Ambulatory Visit (HOSPITAL_BASED_OUTPATIENT_CLINIC_OR_DEPARTMENT_OTHER)

## 2023-07-17 ENCOUNTER — Ambulatory Visit (HOSPITAL_BASED_OUTPATIENT_CLINIC_OR_DEPARTMENT_OTHER): Admitting: Obstetrics & Gynecology

## 2023-07-17 VITALS — BP 110/67 | HR 61 | Ht 63.0 in | Wt 107.4 lb

## 2023-07-17 DIAGNOSIS — D251 Intramural leiomyoma of uterus: Secondary | ICD-10-CM

## 2023-07-17 DIAGNOSIS — D250 Submucous leiomyoma of uterus: Secondary | ICD-10-CM

## 2023-07-17 DIAGNOSIS — N920 Excessive and frequent menstruation with regular cycle: Secondary | ICD-10-CM | POA: Diagnosis not present

## 2023-07-17 DIAGNOSIS — D509 Iron deficiency anemia, unspecified: Secondary | ICD-10-CM

## 2023-07-17 DIAGNOSIS — D500 Iron deficiency anemia secondary to blood loss (chronic): Secondary | ICD-10-CM | POA: Diagnosis not present

## 2023-07-17 DIAGNOSIS — N950 Postmenopausal bleeding: Secondary | ICD-10-CM | POA: Diagnosis not present

## 2023-07-17 NOTE — Progress Notes (Signed)
 GYNECOLOGY  VISIT  CC:   Discuss ultrasound results, h/o menorrhagia  HPI: 53 y.o. [id number] Married [place] female here for discussion of ultrasound results.  Ultrasound obtained due to h/o menorrhagia with iron  deficiency anemia.  She does have hx of fibroids.  Has received IV iron  to treat iron  deficiency.   Uterus 9.4 x 10.9 x 5.5cm with intramural fibroids measuring 4.3 x 4.3cm and 3.6 x 3.5cm.  Sonohysterogram showed fibroid to be almost completely submucosa, most c/w type 0 fibroid.  Giving findings recommend considering resection of fibroid and possible ablation once that is completed.  Doing the ablation would depend on how much of the lesion is submucosal.  Risks reviewed.  She woud like to proceed with scheduling.  .   Past Medical History:  Diagnosis Date   ANA positive    ?Sjogrens   Breast neoplasm, Tis (LCIS), left    Cholecystitis    Cholelithiasis    Dry eye    Fibroid    Gall stones    GERD (gastroesophageal reflux disease)    Hypothyroidism    Takes synthroid    Infertility, female    Keratitis 2020   Lactose intolerance    Leukopenia    Sjogren's disease (HCC)    Thrombocytopenia (HCC)    Urticaria    episodes since childhood    Vitamin D  deficiency disease    Takes Vit D   Weight loss     MEDS:   Current Outpatient Medications on File Prior to Visit  Medication Sig Dispense Refill   clobetasol  (TEMOVATE ) 0.05 % external solution Apply topically 2 (two) times daily 50 mL 1   clobetasol  (TEMOVATE ) 0.05 % external solution Apply topically 2 (two) times daily. 50 mL 3   clobetasol  (TEMOVATE ) 0.05 % external solution Apply 1 Application topically 2 (two) times daily. 50 mL 3   cycloSPORINE , PF, (CEQUA ) 0.09 % SOLN Place 1 drop into both eyes 2 (two) times daily. 180 each 3   EPINEPHrine  (EPIPEN  2-PAK) 0.3 mg/0.3 mL IJ SOAJ injection Use as directed as directed 2 each 1   hydroxychloroquine  (PLAQUENIL ) 200 MG tablet TAKE 2 TABLETS BY MOUTH EVERY OTHER DAY  ALTERNATING WITH 1 TABLET EVERY OTHER DAY. 135 tablet 3   hydroxychloroquine  (PLAQUENIL ) 200 MG tablet Take 2 tablets (400mg ) by mouth every other day, alternating with 1 tablet every other day. 135 tablet 3   hydroxychloroquine  (PLAQUENIL ) 200 MG tablet Take 2 tablets (400 mg total) by mouth daily. 180 tablet 1   moxifloxacin  (VIGAMOX ) 0.5 % ophthalmic solution Place 1 drop into the left eye 2 (two) times daily. 3 mL 1   norethindrone  (AYGESTIN ) 5 MG tablet Take 1 tablet (5 mg total) by mouth daily. 30 tablet 1   rosuvastatin  (CRESTOR ) 20 MG tablet Take 1 tablet (20 mg total) by mouth daily. 90 tablet 3   UNABLE TO FIND Place 1 drop into both eyes 6 (six) times daily. Plasma reach growth factor Platelet rich growth factor eye drops     VITAMIN D  PO Take by mouth. Takes a few times a month     No current facility-administered medications on file prior to visit.    ALLERGIES: Lactose intolerance (gi), Lifitegrast, Latex, Systane balance [propylene glycol], Tape, and Wound dressing adhesive  SH:  married, non smoker  Review of Systems  Constitutional: Negative.   Genitourinary:        Menorrhagia    PHYSICAL EXAMINATION:    BP 110/67   Pulse 61  Ht 5' 3 (1.6 m) Comment: Reported  Wt 107 lb 6.4 oz (48.7 kg)   BMI 19.03 kg/m      Physical Exam Constitutional:      Appearance: Normal appearance.   Neurological:     General: No focal deficit present.     Mental Status: She is alert.   Psychiatric:        Mood and Affect: Mood normal.        Thought Content: Thought content normal.     Chaperone present for exam.  Assessment/Plan: 1. Menorrhagia with regular cycle (Primary) - will proceed with surgery scheduling.  Hopefully can get this scheduled quickly for pt.   2. Fibroids, submucosal  3. Iron  deficiency anemia due to chronic blood loss

## 2023-07-19 ENCOUNTER — Other Ambulatory Visit (HOSPITAL_BASED_OUTPATIENT_CLINIC_OR_DEPARTMENT_OTHER): Payer: Self-pay | Admitting: Obstetrics & Gynecology

## 2023-07-19 ENCOUNTER — Encounter (HOSPITAL_BASED_OUTPATIENT_CLINIC_OR_DEPARTMENT_OTHER): Payer: Self-pay | Admitting: Obstetrics & Gynecology

## 2023-07-19 DIAGNOSIS — N920 Excessive and frequent menstruation with regular cycle: Secondary | ICD-10-CM

## 2023-07-19 DIAGNOSIS — D250 Submucous leiomyoma of uterus: Secondary | ICD-10-CM

## 2023-07-22 ENCOUNTER — Encounter (HOSPITAL_BASED_OUTPATIENT_CLINIC_OR_DEPARTMENT_OTHER): Payer: Self-pay | Admitting: Obstetrics & Gynecology

## 2023-07-22 ENCOUNTER — Encounter (HOSPITAL_BASED_OUTPATIENT_CLINIC_OR_DEPARTMENT_OTHER): Payer: Self-pay

## 2023-07-22 ENCOUNTER — Telehealth: Payer: Self-pay

## 2023-07-22 NOTE — Telephone Encounter (Signed)
 Patient called to confirm 08/01/23 surgery details for Dr. Cleotilde. Patient confirmed understanding of details.

## 2023-07-30 ENCOUNTER — Encounter (HOSPITAL_COMMUNITY): Payer: Self-pay | Admitting: Obstetrics & Gynecology

## 2023-07-30 ENCOUNTER — Other Ambulatory Visit: Payer: Self-pay

## 2023-07-30 ENCOUNTER — Other Ambulatory Visit (HOSPITAL_BASED_OUTPATIENT_CLINIC_OR_DEPARTMENT_OTHER): Payer: Self-pay | Admitting: Obstetrics & Gynecology

## 2023-07-30 DIAGNOSIS — N920 Excessive and frequent menstruation with regular cycle: Secondary | ICD-10-CM

## 2023-07-30 DIAGNOSIS — D500 Iron deficiency anemia secondary to blood loss (chronic): Secondary | ICD-10-CM

## 2023-07-30 DIAGNOSIS — D250 Submucous leiomyoma of uterus: Secondary | ICD-10-CM

## 2023-07-30 NOTE — Progress Notes (Signed)
 SDW call  Patient was given pre-op instructions over the phone. Patient verbalized understanding of instructions provided.     PCP - Dr. Norleen Jungling Cardiologist - Dr. Vinie Maxcy Cardothoracic: Dr. Linnie Rayas Pulmonary:    PPM/ICD - denies Device Orders - na Rep Notified - na   Chest x-ray - na EKG -   Stress Test - ECHO - 05/29/2021 Cardiac Cath -   Sleep Study/sleep apnea/CPAP: denies  Non-diabetic  Blood Thinner Instructions: denies Aspirin Instructions:denies   ERAS Protcol - Clears until 0430   Anesthesia review: Yes. Sjogren's disease, Iron  deficiency anemia, SVT, HLD, ground glass    Patient denies shortness of breath, fever, cough and chest pain over the phone call  Your procedure is scheduled on Thursday August 01, 2023  Report to [facility] Main Entrance A at 0530   A.M., then check in with the Admitting office.  Call this number if you have problems the morning of surgery:  [phone]   If you have any questions prior to your surgery date call [phone]: Open Monday-Friday 8am-4pm If you experience any cold or flu symptoms such as cough, fever, chills, shortness of breath, etc. between now and your scheduled surgery, please notify us  at the above number    Remember:  Do not eat after midnight the night before your surgery  You may drink clear liquids until 0430  the morning of your surgery.   Clear liquids allowed are: Water , Non-Citrus Juices (without pulp), Carbonated Beverages, Clear Tea, Black Coffee ONLY (NO MILK, CREAM OR POWDERED CREAMER of any kind), and Gatorade   Take these medicines the morning of surgery with A SIP OF WATER :  Optive eye drops, cequa  eye drops, plaquenil , norethindrone , rosuvastatin   As of today, STOP taking any Aspirin (unless otherwise instructed by your surgeon) Aleve, Naproxen, Ibuprofen, Motrin, Advil, Goody's, BC's, all herbal medications, fish oil, and all vitamins.

## 2023-07-31 NOTE — Progress Notes (Signed)
 Called and spoke with patient. Informed patient to arrive at 0600 for her procedure changed to 0830.

## 2023-07-31 NOTE — Anesthesia Preprocedure Evaluation (Signed)
 Anesthesia Evaluation  Patient identified by MRN, date of birth, ID band Patient awake    Reviewed: Allergy  & Precautions, NPO status , Patient's Chart, lab work & pertinent test results  Airway Mallampati: II  TM Distance: >3 FB Neck ROM: Full    Dental no notable dental hx.    Pulmonary neg pulmonary ROS   Pulmonary exam normal        Cardiovascular + dysrhythmias  Rhythm:Regular Rate:Normal     Neuro/Psych negative neurological ROS  negative psych ROS   GI/Hepatic Neg liver ROS,GERD  ,,  Endo/Other  Hypothyroidism    Renal/GU negative Renal ROS  Female GU complaint     Musculoskeletal negative musculoskeletal ROS (+)    Abdominal Normal abdominal exam  (+)   Peds  Hematology  (+) Blood dyscrasia, anemia Lab Results      Component                Value               Date                      WBC                      2.4 (LL)            05/27/2023                HGB                      9.4 (L)             05/27/2023                HCT                      30.3 (L)            05/27/2023                MCV                      94                  05/27/2023                PLT                      173                 05/27/2023              Anesthesia Other Findings   Reproductive/Obstetrics                              Anesthesia Physical Anesthesia Plan  ASA: 3  Anesthesia Plan: General   Post-op Pain Management: Celebrex PO (pre-op)* and Tylenol  PO (pre-op)*   Induction: Intravenous  PONV Risk Score and Plan: 3 and Ondansetron , Dexamethasone , Midazolam  and Treatment may vary due to age or medical condition  Airway Management Planned: Mask and LMA  Additional Equipment: None  Intra-op Plan:   Post-operative Plan: Extubation in OR  Informed Consent: I have reviewed the patients History and Physical, chart, labs and discussed the procedure including the risks, benefits and  alternatives for the proposed anesthesia with the patient or authorized  representative who has indicated his/her understanding and acceptance.     Dental advisory given  Plan Discussed with: CRNA  Anesthesia Plan Comments:          Anesthesia Quick Evaluation

## 2023-08-01 ENCOUNTER — Other Ambulatory Visit: Payer: Self-pay

## 2023-08-01 ENCOUNTER — Other Ambulatory Visit (HOSPITAL_BASED_OUTPATIENT_CLINIC_OR_DEPARTMENT_OTHER): Payer: Self-pay | Admitting: Obstetrics & Gynecology

## 2023-08-01 ENCOUNTER — Ambulatory Visit (HOSPITAL_BASED_OUTPATIENT_CLINIC_OR_DEPARTMENT_OTHER): Payer: Self-pay | Admitting: Physician Assistant

## 2023-08-01 ENCOUNTER — Ambulatory Visit (HOSPITAL_COMMUNITY)
Admission: RE | Admit: 2023-08-01 | Discharge: 2023-08-01 | Disposition: A | Discharge status: Home / Self Care | Attending: Obstetrics & Gynecology | Admitting: Obstetrics & Gynecology

## 2023-08-01 ENCOUNTER — Other Ambulatory Visit (HOSPITAL_COMMUNITY): Payer: Self-pay

## 2023-08-01 ENCOUNTER — Ambulatory Visit (HOSPITAL_COMMUNITY): Payer: Self-pay | Admitting: Physician Assistant

## 2023-08-01 ENCOUNTER — Encounter (HOSPITAL_COMMUNITY)
Admission: RE | Disposition: A | Discharge status: Home / Self Care | Payer: Self-pay | Source: Home / Self Care | Attending: Obstetrics & Gynecology

## 2023-08-01 ENCOUNTER — Encounter (HOSPITAL_COMMUNITY): Payer: Self-pay | Admitting: Obstetrics & Gynecology

## 2023-08-01 DIAGNOSIS — K219 Gastro-esophageal reflux disease without esophagitis: Secondary | ICD-10-CM | POA: Insufficient documentation

## 2023-08-01 DIAGNOSIS — D250 Submucous leiomyoma of uterus: Secondary | ICD-10-CM | POA: Diagnosis present

## 2023-08-01 DIAGNOSIS — D500 Iron deficiency anemia secondary to blood loss (chronic): Secondary | ICD-10-CM

## 2023-08-01 DIAGNOSIS — D509 Iron deficiency anemia, unspecified: Secondary | ICD-10-CM | POA: Insufficient documentation

## 2023-08-01 DIAGNOSIS — I499 Cardiac arrhythmia, unspecified: Secondary | ICD-10-CM | POA: Diagnosis not present

## 2023-08-01 DIAGNOSIS — E039 Hypothyroidism, unspecified: Secondary | ICD-10-CM | POA: Insufficient documentation

## 2023-08-01 DIAGNOSIS — N920 Excessive and frequent menstruation with regular cycle: Secondary | ICD-10-CM

## 2023-08-01 DIAGNOSIS — D251 Intramural leiomyoma of uterus: Secondary | ICD-10-CM | POA: Diagnosis not present

## 2023-08-01 HISTORY — DX: Pure hypercholesterolemia, unspecified: E78.00

## 2023-08-01 HISTORY — DX: Cardiac arrhythmia, unspecified: I49.9

## 2023-08-01 HISTORY — PX: HYSTEROSCOPY WITH MYOMECTOMY: SHX7591

## 2023-08-01 HISTORY — DX: Iron deficiency anemia, unspecified: D50.9

## 2023-08-01 LAB — CBC
HCT: 40 % (ref 36.0–46.0)
Hemoglobin: 13.1 g/dL (ref 12.0–15.0)
MCH: 32.1 pg (ref 26.0–34.0)
MCHC: 32.8 g/dL (ref 30.0–36.0)
MCV: 98 fL (ref 80.0–100.0)
Platelets: 130 10*3/uL — ABNORMAL LOW (ref 150–400)
RBC: 4.08 MIL/uL (ref 3.87–5.11)
RDW: 15.1 % (ref 11.5–15.5)
WBC: 3.5 10*3/uL — ABNORMAL LOW (ref 4.0–10.5)
nRBC: 0 % (ref 0.0–0.2)

## 2023-08-01 LAB — BASIC METABOLIC PANEL WITH GFR
Anion gap: 9 (ref 5–15)
BUN: 15 mg/dL (ref 6–20)
CO2: 21 mmol/L — ABNORMAL LOW (ref 22–32)
Calcium: 9 mg/dL (ref 8.9–10.3)
Chloride: 106 mmol/L (ref 98–111)
Creatinine, Ser: 0.94 mg/dL (ref 0.44–1.00)
GFR, Estimated: >60 mL/min (ref >60)
Glucose, Bld: 87 mg/dL (ref 70–99)
Potassium: 3.6 mmol/L (ref 3.5–5.1)
Sodium: 136 mmol/L (ref 135–145)

## 2023-08-01 LAB — TYPE AND SCREEN
ABO/RH(D): B POS
Antibody Screen: NEGATIVE

## 2023-08-01 LAB — POCT PREGNANCY, URINE: Preg Test, Ur: NEGATIVE

## 2023-08-01 LAB — ABO/RH: ABO/RH(D): B POS

## 2023-08-01 SURGERY — HYSTEROSCOPY WITH MYOMECTOMY
Anesthesia: General

## 2023-08-01 MED ORDER — IBUPROFEN 800 MG PO TABS
800.0000 mg | ORAL_TABLET | Freq: Three times a day (TID) | ORAL | 0 refills | Status: DC | PRN
Start: 2023-08-01 — End: 2023-10-10
  Filled 2023-08-01: qty 20, 7d supply, fill #0

## 2023-08-01 MED ORDER — DROPERIDOL 2.5 MG/ML IJ SOLN: 0.6250 mg | Freq: Once | INTRAMUSCULAR | Status: DC | PRN

## 2023-08-01 MED ORDER — ORAL CARE MOUTH RINSE
15.0000 mL | Freq: Once | OROMUCOSAL | Status: AC
  Administered 2023-08-01: 15 mL via OROMUCOSAL

## 2023-08-01 MED ORDER — PROPOFOL 500 MG/50ML IV EMUL
INTRAVENOUS | Status: DC | PRN
  Administered 2023-08-01: 100 ug/kg/min via INTRAVENOUS

## 2023-08-01 MED ORDER — EPHEDRINE SULFATE-NACL 50-0.9 MG/10ML-% IV SOSY
PREFILLED_SYRINGE | INTRAVENOUS | Status: DC | PRN
  Administered 2023-08-01 (×2): 5 mg via INTRAVENOUS

## 2023-08-01 MED ORDER — VASOPRESSIN 20 UNIT/ML IV SOLN
INTRAVENOUS | Status: DC | PRN
  Administered 2023-08-01: 8 mL via SURGICAL_CAVITY

## 2023-08-01 MED ORDER — MIDAZOLAM HCL 2 MG/2ML IJ SOLN
INTRAMUSCULAR | Status: AC
  Filled 2023-08-01: qty 2

## 2023-08-01 MED ORDER — HYDROCODONE-ACETAMINOPHEN 5-325 MG PO TABS
1.0000 | ORAL_TABLET | Freq: Four times a day (QID) | ORAL | 0 refills | Status: DC | PRN
  Filled 2023-08-01: qty 12, 2d supply, fill #0

## 2023-08-01 MED ORDER — ACETAMINOPHEN 500 MG PO TABS
1000.0000 mg | ORAL_TABLET | Freq: Once | ORAL | Status: AC
  Administered 2023-08-01: 1000 mg via ORAL
  Filled 2023-08-01: qty 2

## 2023-08-01 MED ORDER — VASOPRESSIN 20 UNIT/ML IV SOLN
INTRAVENOUS | Status: AC
  Filled 2023-08-01: qty 1

## 2023-08-01 MED ORDER — FENTANYL CITRATE (PF) 250 MCG/5ML IJ SOLN
INTRAMUSCULAR | Status: AC
  Filled 2023-08-01: qty 5

## 2023-08-01 MED ORDER — SODIUM CHLORIDE (PF) 0.9 % IJ SOLN
INTRAMUSCULAR | Status: AC
  Filled 2023-08-01: qty 100

## 2023-08-01 MED ORDER — LIDOCAINE-EPINEPHRINE 1 %-1:100000 IJ SOLN
INTRAMUSCULAR | Status: DC | PRN
Start: 2023-08-01 — End: 2023-08-01
  Administered 2023-08-01: 8 mL

## 2023-08-01 MED ORDER — CELECOXIB 200 MG PO CAPS
200.0000 mg | ORAL_CAPSULE | Freq: Once | ORAL | Status: DC
  Filled 2023-08-01: qty 1

## 2023-08-01 MED ORDER — POVIDONE-IODINE 10 % EX SWAB
2.0000 | Freq: Once | CUTANEOUS | Status: AC
  Administered 2023-08-01: 2 via TOPICAL

## 2023-08-01 MED ORDER — FENTANYL CITRATE (PF) 100 MCG/2ML IJ SOLN: 25.0000 ug | INTRAMUSCULAR | Status: DC | PRN

## 2023-08-01 MED ORDER — MIDAZOLAM HCL 2 MG/2ML IJ SOLN
INTRAMUSCULAR | Status: DC | PRN
  Administered 2023-08-01: 1 mg via INTRAVENOUS

## 2023-08-01 MED ORDER — SODIUM CHLORIDE 0.9 % IR SOLN
Status: DC | PRN
  Administered 2023-08-01 (×3): 3000 mL
  Administered 2023-08-01: 6000 mL

## 2023-08-01 MED ORDER — ACETAMINOPHEN 500 MG PO TABS: 1000.0000 mg | ORAL_TABLET | ORAL | Status: DC

## 2023-08-01 MED ORDER — ONDANSETRON HCL 4 MG/2ML IJ SOLN
INTRAMUSCULAR | Status: DC | PRN
  Administered 2023-08-01: 4 mg via INTRAVENOUS

## 2023-08-01 MED ORDER — PROPOFOL 10 MG/ML IV BOLUS
INTRAVENOUS | Status: DC | PRN
  Administered 2023-08-01: 100 mg via INTRAVENOUS

## 2023-08-01 MED ORDER — LIDOCAINE-EPINEPHRINE 1 %-1:100000 IJ SOLN
INTRAMUSCULAR | Status: AC
  Filled 2023-08-01: qty 1

## 2023-08-01 MED ORDER — NORETHINDRONE ACETATE 5 MG PO TABS
5.0000 mg | ORAL_TABLET | Freq: Every day | ORAL | 1 refills | Status: DC
  Filled 2023-08-01: qty 30, 30d supply, fill #0

## 2023-08-01 MED ORDER — LACTATED RINGERS IV SOLN
INTRAVENOUS | Status: DC
Start: 2023-08-01 — End: 2023-08-01

## 2023-08-01 MED ORDER — LIDOCAINE 2% (20 MG/ML) 5 ML SYRINGE
INTRAMUSCULAR | Status: DC | PRN
  Administered 2023-08-01: 40 mg via INTRAVENOUS

## 2023-08-01 MED ORDER — FENTANYL CITRATE (PF) 250 MCG/5ML IJ SOLN
INTRAMUSCULAR | Status: DC | PRN
  Administered 2023-08-01 (×2): 25 ug via INTRAVENOUS
  Administered 2023-08-01: 50 ug via INTRAVENOUS

## 2023-08-01 MED ORDER — DEXAMETHASONE SODIUM PHOSPHATE 10 MG/ML IJ SOLN
INTRAMUSCULAR | Status: DC | PRN
  Administered 2023-08-01: 5 mg via INTRAVENOUS

## 2023-08-01 MED ORDER — DEXMEDETOMIDINE HCL IN NACL 80 MCG/20ML IV SOLN
INTRAVENOUS | Status: DC | PRN
  Administered 2023-08-01: 8 ug via INTRAVENOUS

## 2023-08-01 MED ORDER — CHLORHEXIDINE GLUCONATE 0.12 % MT SOLN: 15.0000 mL | Freq: Once | OROMUCOSAL | Status: AC

## 2023-08-01 SURGICAL SUPPLY — 28 items
ABLATOR SURESOUND NOVASURE (ABLATOR) IMPLANT
CANISTER SUCTION 3000ML PPV (SUCTIONS) ×1 IMPLANT
CATH FOLEY LF 14FR (CATHETERS) IMPLANT
CATH ROBINSON RED A/P 16FR (CATHETERS) ×1 IMPLANT
COVER MAYO STAND STRL (DRAPES) ×2 IMPLANT
DEVICE MYOSURE LITE (MISCELLANEOUS) IMPLANT
DEVICE MYOSURE REACH (MISCELLANEOUS) IMPLANT
DILATOR CANAL MILEX (MISCELLANEOUS) IMPLANT
GLOVE BIOGEL PI IND STRL 7.0 (GLOVE) ×2 IMPLANT
GLOVE ECLIPSE 6.5 STRL STRAW (GLOVE) ×1 IMPLANT
GLOVE ECLIPSE 7.0 STRL STRAW (GLOVE) ×2 IMPLANT
GLOVE SURG UNDER POLY LF SZ7 (GLOVE) ×2 IMPLANT
GOWN STRL REUS W/ TWL LRG LVL3 (GOWN DISPOSABLE) ×2 IMPLANT
GOWN STRL REUS W/ TWL XL LVL3 (GOWN DISPOSABLE) ×1 IMPLANT
KIT PROCEDURE FLUENT (KITS) ×1 IMPLANT
KIT TURNOVER KIT B (KITS) ×1 IMPLANT
NDL ASPIRATION 22 (NEEDLE) IMPLANT
NEEDLE ASPIRATION 22 (NEEDLE) ×1 IMPLANT
NS IRRIG 1000ML POUR BTL (IV SOLUTION) ×1 IMPLANT
PACK VAGINAL MINOR WOMEN LF (CUSTOM PROCEDURE TRAY) ×1 IMPLANT
PAD OB MATERNITY 11 LF (PERSONAL CARE ITEMS) ×1 IMPLANT
SEAL ROD LENS SCOPE MYOSURE (ABLATOR) ×1 IMPLANT
SET GENESYS HTA PROCERVA (MISCELLANEOUS) ×1 IMPLANT
SOL .9 NS 3000ML IRR UROMATIC (IV SOLUTION) IMPLANT
SYR TOOMEY 50ML (SYRINGE) ×1 IMPLANT
SYSTEM TISS REMOVAL MYOSURE XL (MISCELLANEOUS) IMPLANT
TOWEL GREEN STERILE FF (TOWEL DISPOSABLE) ×2 IMPLANT
UNDERPAD 30X36 HEAVY ABSORB (UNDERPADS AND DIAPERS) ×1 IMPLANT

## 2023-08-01 NOTE — Discharge Instructions (Signed)
 Post-surgical Instructions, Outpatient Surgery  You may expect to feel dizzy, weak, and drowsy for as long as 24 hours after receiving the medicine that made you sleep (anesthetic). For the first 24 hours after your surgery:   Do not drive a car, ride a bicycle, participate in physical activities, or take public transportation until you are done taking narcotic pain medicines or as directed by Dr. Cleotilde.  Do not drink alcohol  or take tranquilizers.  Do not take medicine that has not been prescribed by your physicians.  Do not sign important papers or make important decisions while on narcotic pain medicines.  Have a responsible person with you.   PAIN MANAGEMENT Motrin 800mg .  (This is the same as 4-200mg  over the counter tablets of Motrin or ibuprofen.)  You may take this every eight hours or as needed for cramping.   Vicodin 5/325mg .  For more severe pain, take one or two tablets every four to six hours as needed for pain control.  (Remember that narcotic pain medications increase your risk of constipation.  If this becomes a problem, you may take an over the counter stool softener like Colace 100mg  up to four times a day.)   DO'S AND DON'T'S Do not take a tub bath for one week.  You may shower on the first day after your surgery Do not do any heavy lifting for one to two weeks.  This increases the chance of bleeding. Do move around as you feel able.  Stairs are fine.  You may begin to exercise again as you feel able.  Do not lift any weights for two weeks. Do not put anything in the vagina for two weeks--no tampons, intercourse, or douching.    REGULAR MEDIATIONS/VITAMINS: You may restart all of your regular medications as prescribed. You may restart all of your vitamins as you normally take them.   The norethindrone  can be decreased to 5mg  daily if bleeding is not heavy.  Take for 3 weeks and then stop.  You will have a cycle at that time.    PLEASE CALL OR SEEK MEDICAL CARE IF: You  have persistent nausea and vomiting.  You have trouble eating or drinking.  You have an oral temperature above 100.5.  You have constipation that is not helped by adjusting diet or increasing fluid intake. Pain medicines are a common cause of constipation.  You have heavy vaginal bleeding

## 2023-08-01 NOTE — Transfer of Care (Signed)
 Immediate Anesthesia Transfer of Care Note  Patient: Sarah [name]  Procedure(s) Performed: HYSTEROSCOPY WITH MYOMECTOMY  Patient Location: PACU  Anesthesia Type:General  Level of Consciousness: sedated and responds to stimulation  Airway & Oxygen Therapy: Patient Spontanous Breathing and Patient connected to face mask oxygen  Post-op Assessment: Report given to RN and Post -op Vital signs reviewed and stable  Post vital signs: Reviewed and stable  Last Vitals:  Vitals Value Taken Time  BP 130/70 08/01/23 10:40  Temp    Pulse 79 08/01/23 10:43  Resp 21 08/01/23 10:43  SpO2 100 % 08/01/23 10:43  Vitals shown include unfiled device data.  Last Pain:  Vitals:   08/01/23 0705  TempSrc:   PainSc: 0-No pain         Complications: No notable events documented.

## 2023-08-01 NOTE — Anesthesia Procedure Notes (Signed)
 Procedure Name: LMA Insertion Date/Time: 08/01/2023 8:38 AM  Performed by: Mollie Olivia SAUNDERS, CRNAPre-anesthesia Checklist: Patient identified, Emergency Drugs available, Suction available and Patient being monitored Patient Re-evaluated:Patient Re-evaluated prior to induction Oxygen Delivery Method: Circle System Utilized Preoxygenation: Pre-oxygenation with 100% oxygen Induction Type: IV induction Ventilation: Mask ventilation without difficulty LMA: LMA inserted LMA Size: 3.0 Number of attempts: 1 (pt  has small mouth opening; could easily size down if necessary to 2.5 c more air) Placement Confirmation: positive ETCO2 Tube secured with: Tape Dental Injury: Teeth and Oropharynx as per pre-operative assessment

## 2023-08-01 NOTE — H&P (Signed)
 Sarah [name] is an 53 y.o. female G1P0 Married [place] female here for hysteroscopic myomectomy and possible endometrial ablation due to submucosal fibroid that appears to be a type 0 fibroid.  If there is a more narrow stalk, endometrial ablation will be done after that.  Procedure, risks and benefits have been discussed.  Questions answered.  Pt ready to proceed.    Pertinent Gynecological History: Menses: menorrhagia Contraception: none, h/o infertility DES exposure: denies Blood transfusions: none, h/o iron  infusions Sexually transmitted diseases: no past history Previous GYN Procedures: none  Last mammogram: h/o bilateral mastectomy Last pap: normal Date: 06/2020 OB History: G1, P0   Menstrual History: No LMP recorded (lmp unknown). Patient is premenopausal.    Past Medical History:  Diagnosis Date   ANA positive    ?Sjogrens   Breast neoplasm, Tis (LCIS), left    Cholecystitis    Cholelithiasis    Dry eye    Dysrhythmia    SVT   Fibroid    Gall stones    GERD (gastroesophageal reflux disease)    High cholesterol    Hypothyroidism    Takes synthroid    Infertility, female    Iron  deficiency anemia    Keratitis 2020   Lactose intolerance    Leukopenia    Sjogren's disease (HCC)    Thrombocytopenia (HCC)    Urticaria    episodes since childhood    Vitamin D  deficiency disease    Takes Vit D   Weight loss     Past Surgical History:  Procedure Laterality Date   CHOLECYSTECTOMY N/A 07/13/2014   Procedure: LAPAROSCOPIC CHOLECYSTECTOMY WITH INTRAOPERATIVE CHOLANGIOGRAM;  Surgeon: Donnice Bury, MD;  Location: [facility] OR;  Service: General;  Laterality: N/A;   egg retrieval  2008; 2011; 2012   multiple IVF cycles   ENDOMETRIAL BIOPSY     SIMPLE MASTECTOMY WITH AXILLARY SENTINEL NODE BIOPSY Bilateral 07/15/2019   Procedure: RIGHT RISK REDUCING MASTECTOMY AND LEFT MASTECTOMY  WITH LEFT AXILLARY SENTINEL NODE BIOPSY;  Surgeon: Bury Donnice, MD;  Location: [facility] OR;  Service:  General;  Laterality: Bilateral;  PEC BLOCK   WISDOM TOOTH EXTRACTION      Family History  Problem Relation Age of Onset   Lupus Sister    Thyroid  disease Sister    Thyroid  disease Mother     Social History:  reports that she has never smoked. She has never used smokeless tobacco. She reports that she does not drink alcohol  and does not use drugs.  Allergies:  Allergies  Allergen Reactions   Lactose Intolerance (Gi) Diarrhea    Stomach pain   Lifitegrast Other (See Comments)    corneal lesion   Latex Rash   Systane Balance [Propylene Glycol] Rash   Tape Rash   Wound Dressing Adhesive Rash    Medications Prior to Admission  Medication Sig Dispense Refill Last Dose/Taking   carboxymethylcellul-glycerin (OPTIVE) 0.5-0.9 % ophthalmic solution Place 1 drop into both eyes See admin instructions. Four to Six time a day   08/01/2023 Morning   Cholecalciferol (VITAMIN D -3) 125 MCG (5000 UT) TABS Take by mouth.   Past Month   cycloSPORINE , PF, (CEQUA ) 0.09 % SOLN Place 1 drop into both eyes 2 (two) times daily. 180 each 3 08/01/2023 Morning   EPINEPHrine  (EPIPEN  2-PAK) 0.3 mg/0.3 mL IJ SOAJ injection Use as directed as directed 2 each 1 Taking   hydroxychloroquine  (PLAQUENIL ) 200 MG tablet Take 2 tablets (400mg ) by mouth every other day, alternating with 1 tablet every other day.  135 tablet 3 07/31/2023   hydroxychloroquine  (PLAQUENIL ) 200 MG tablet Take 2 tablets (400 mg total) by mouth daily. 180 tablet 1 Taking   norethindrone  (AYGESTIN ) 5 MG tablet Take 1 tablet (5 mg total) by mouth daily. (Patient taking differently: Take 5 mg by mouth 2 (two) times daily.) 30 tablet 1 Taking Differently   rosuvastatin  (CRESTOR ) 20 MG tablet Take 1 tablet (20 mg total) by mouth daily. 90 tablet 3 Past Week   b complex vitamins capsule Take 1 capsule by mouth every 30 (thirty) days.   More than a month   clobetasol  (TEMOVATE ) 0.05 % external solution Apply 1 Application topically 2 (two) times daily.  (Patient not taking: Reported on 07/29/2023) 50 mL 3 Not Taking   moxifloxacin  (VIGAMOX ) 0.5 % ophthalmic solution Place 1 drop into the left eye 2 (two) times daily. (Patient not taking: Reported on 07/29/2023) 3 mL 1 Not Taking    Review of Systems  Constitutional: Negative.   Respiratory: Negative.    Cardiovascular: Negative.   Hematological: Negative.   Psychiatric/Behavioral: Negative.      Blood pressure 124/70, pulse 66, temperature 97.6 F (36.4 C), temperature source Oral, resp. rate 17, height 5' 3 (1.6 m), weight 48.5 kg, SpO2 100%. Physical Exam Constitutional:      Appearance: Normal appearance.  Cardiovascular:     Rate and Rhythm: Normal rate and regular rhythm.  Pulmonary:     Effort: Pulmonary effort is normal.     Breath sounds: Normal breath sounds.  Neurological:     General: No focal deficit present.  Psychiatric:        Mood and Affect: Mood normal.        Behavior: Behavior normal.     Results for orders placed or performed during the hospital encounter of 08/01/23 (from the past 24 hours)  Type and screen     Status: None (Preliminary result)   Collection Time: 08/01/23  7:11 AM  Result Value Ref Range   ABO/RH(D) PENDING    Antibody Screen PENDING    Sample Expiration      08/04/2023,2359 Performed at [facility], [address], [place], [place] [address]   ABO/Rh     Status: None   Collection Time: 08/01/23  7:16 AM  Result Value Ref Range   ABO/RH(D)      B POS Performed at [facility], [address], [place], [place] [address]   Pregnancy, urine POC     Status: None   Collection Time: 08/01/23  7:27 AM  Result Value Ref Range   Preg Test, Ur NEGATIVE NEGATIVE  CBC     Status: Abnormal   Collection Time: 08/01/23  7:30 AM  Result Value Ref Range   WBC 3.5 (L) 4.0 - 10.5 K/uL   RBC 4.08 3.87 - 5.11 MIL/uL   Hemoglobin 13.1 12.0 - 15.0 g/dL   HCT 59.9 63.9 - 53.9 %   MCV 98.0 80.0 - 100.0 fL   MCH 32.1 26.0 - 34.0 pg    MCHC 32.8 30.0 - 36.0 g/dL   RDW 84.8 88.4 - 84.4 %   Platelets 130 (L) 150 - 400 K/uL   nRBC 0.0 0.0 - 0.2 %    No results found.  Assessment/Plan: 53 yo G1P0 M Asian female here for hysteroscopic myomectomy with possible endometrial ablation due to submucosal fibroid, h/o menorrhagia and h/o multiple iron  infusions.  Questions answered.  Pt ready to proceed.   Ronal GORMAN Pinal  08/01/2023, 8:12 AM

## 2023-08-01 NOTE — Anesthesia Postprocedure Evaluation (Signed)
 Anesthesia Post Note  Patient: Cherelle Midkiff  Procedure(s) Performed: HYSTEROSCOPY WITH MYOMECTOMY     Patient location during evaluation: PACU Anesthesia Type: General Level of consciousness: awake and alert Pain management: pain level controlled Vital Signs Assessment: post-procedure vital signs reviewed and stable Respiratory status: spontaneous breathing, nonlabored ventilation, respiratory function stable and patient connected to nasal cannula oxygen Cardiovascular status: blood pressure returned to baseline and stable Postop Assessment: no apparent nausea or vomiting Anesthetic complications: no   No notable events documented.  Last Vitals:  Vitals:   08/01/23 1115 08/01/23 1130  BP: 134/66 135/73  Pulse: 72 70  Resp: 15 11  Temp:  37 C  SpO2: 98% 98%    Last Pain:  Vitals:   08/01/23 1130  TempSrc:   PainSc: 0-No pain                 Cordella P Calypso Hagarty

## 2023-08-01 NOTE — Op Note (Signed)
 08/01/2023  10:55 AM  PATIENT:  Sarah [name]  53 y.o. female  PRE-OPERATIVE DIAGNOSIS:  Fibroid uterus including 3.5cm submucosal fibroid and smaller intramural fibroids, menorrhagia with regular cycle, h/o iron  deficiency anemia  POST-OPERATIVE DIAGNOSIS:  Same  PROCEDURE:  Procedure(s): HYSTEROSCOPY WITH MYOMECTOMY  SURGEON:  Ronal GORMAN Pinal  ASSISTANTS: OR staff.    ANESTHESIA:   general  ESTIMATED BLOOD LOSS: 20 mL  BLOOD ADMINISTERED:none   FLUIDS: 800 cc L$  UOP: 40cc drained at beginning of procedure  SPECIMEN:  myoma in pieces  DISPOSITION OF SPECIMEN:  PATHOLOGY  FINDINGS: large submucosal fibroid, with right sided/fundal attachment, filling the endometrial cavity, measured 3.5cm on ultrasound  DESCRIPTION OF OPERATION: Patient was taken to the operating room.  She is placed in the supine position. SCDs were on her lower extremities and functioning properly. General anesthesia with an LMA was administered without difficulty. Dr. Jerrlyn, anesthesia, oversaw case.  Legs were then placed in the [facility] stirrups in the low lithotomy position. The legs were lifted to the high lithotomy position and the Betadine prep was used on the inner thighs perineum and vagina x3. Patient was draped in a normal standard fashion. An in and out catheterization with a red rubber Foley catheter was performed. Approximately 40 cc of clear urine was noted. A bivalve speculum was placed the vagina. The anterior lip of the cervix was grasped with single-tooth tenaculum.  Dilute vasopressin solution (20u in 100cc NS) instilled the cervix.  6 cc used.  The cervix is dilated up to #23 [facility] dilators. The endometrial cavity sounded to 6 cm but this possibly was a shorter measurement due to submucosal fibroid.  A 7.36mm myosure hysteroscope was obtained. Normal saline was used as a hysteroscopic fluid. The hysteroscope was advanced through the endocervical canal into the endometrial cavity. The tubal ostium  was only seen on the left at the fibroid was obstructing the view of the right tubal ostium.  The fibroid as attached on the left side and fundally.  Before beginning the resection, a long needle was passed through the hysteroscope and 2ccs more was placed into the fibroid.  Good blanching was seen.  Then using the myosure XL device, the entire fibroid was resected.  The endometrium was thin so curettage was not performed.  There was some endometrium obtained with the [facility] device as there appeared to be some small polyps or an area of polypoid endometrium noted.  Fluid deficit was 650cc.  The hysteroscope was removed.  As the fibroid did appear to be a type 0, the novasure device was obtained.  Uterus sounded to 8cm.  The cavity length was 5cm.  The device was passed but when opened, the width of the cavity was not enough to be able to perform the ablation.  Device was removed, ensured it was working properly, and then passed again.  The same issue was encountered, so this procedure was not performed.   At this point no other procedure was needed and this procedure was ended.  A paracervical block of 1% lidocaine  mixed one-to-one with epinephrine  (1:100,000 units).  8cc was used total. The tenaculum was removed from the anterior lip of the cervix. The speculum was removed from the vagina. The prep was cleansed of the patient's skin. The legs are positioned back in the supine position. Sponge, lap, needle, instrument counts were correct x2. Patient was taken to recovery in stable condition.  COUNTS:  YES  PLAN OF CARE: Transfer to PACU

## 2023-08-02 ENCOUNTER — Encounter (HOSPITAL_COMMUNITY): Payer: Self-pay | Admitting: Obstetrics & Gynecology

## 2023-08-05 ENCOUNTER — Ambulatory Visit (HOSPITAL_BASED_OUTPATIENT_CLINIC_OR_DEPARTMENT_OTHER): Payer: Self-pay | Admitting: Obstetrics & Gynecology

## 2023-08-05 LAB — SURGICAL PATHOLOGY

## 2023-10-01 ENCOUNTER — Other Ambulatory Visit (HOSPITAL_COMMUNITY): Payer: Self-pay

## 2023-10-10 ENCOUNTER — Other Ambulatory Visit (HOSPITAL_COMMUNITY)
Admission: RE | Admit: 2023-10-10 | Discharge: 2023-10-10 | Disposition: A | Discharge status: Home / Self Care | Source: Ambulatory Visit | Attending: Obstetrics & Gynecology | Admitting: Obstetrics & Gynecology

## 2023-10-10 ENCOUNTER — Encounter (HOSPITAL_BASED_OUTPATIENT_CLINIC_OR_DEPARTMENT_OTHER): Payer: Self-pay | Admitting: Obstetrics & Gynecology

## 2023-10-10 ENCOUNTER — Ambulatory Visit (HOSPITAL_BASED_OUTPATIENT_CLINIC_OR_DEPARTMENT_OTHER): Admitting: Obstetrics & Gynecology

## 2023-10-10 VITALS — BP 138/75 | HR 72 | Ht 63.0 in | Wt 106.4 lb

## 2023-10-10 DIAGNOSIS — Z01419 Encounter for gynecological examination (general) (routine) without abnormal findings: Secondary | ICD-10-CM | POA: Diagnosis not present

## 2023-10-10 DIAGNOSIS — Z124 Encounter for screening for malignant neoplasm of cervix: Secondary | ICD-10-CM | POA: Diagnosis present

## 2023-10-10 DIAGNOSIS — E2839 Other primary ovarian failure: Secondary | ICD-10-CM

## 2023-10-10 DIAGNOSIS — Z1151 Encounter for screening for human papillomavirus (HPV): Secondary | ICD-10-CM

## 2023-10-10 DIAGNOSIS — Z1331 Encounter for screening for depression: Secondary | ICD-10-CM | POA: Diagnosis not present

## 2023-10-10 DIAGNOSIS — D250 Submucous leiomyoma of uterus: Secondary | ICD-10-CM | POA: Diagnosis not present

## 2023-10-10 DIAGNOSIS — D500 Iron deficiency anemia secondary to blood loss (chronic): Secondary | ICD-10-CM

## 2023-10-10 NOTE — Patient Instructions (Signed)
Call [phone] to schedule an appointment at [facility].

## 2023-10-10 NOTE — Progress Notes (Unsigned)
 ANNUAL EXAM Patient name: Sarah [name] MRN [id number]  Date of birth: 08/12/1970 Chief Complaint:   Annual Exam  History of Present Illness:   Sarah [name] is a 53 y.o. G57P0010 Asian female being seen today for a routine annual exam.  She hasn't had much bleeding since the surgery.  This was mostly just spotting.  She did take progesterone  for 3 weeks after the procedure.  Has been six weeks since stopping the progesterone .  Feeling good from energy standpoint.    No LMP recorded (lmp unknown). Patient is premenopausal.   Last pap 07/12/2020. Results were: NILM w/ HRHPV negative. H/O abnormal pap: no Last mammogram: 05/05/2019. Results were: abnormal follow up with bilateral breast MRIBirads 4: suspicious. 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 Right breast biopsy 5/21/221 mild fibrocystic change.Family h/o breast cancer: no Last colonoscopy: 06/15/2019. Results were: normal. Family h/o colorectal cancer: no     10/10/2023   11:11 AM 07/17/2023   10:20 AM 09/17/2022    1:18 PM 09/11/2021    1:51 PM 07/12/2020    9:31 AM  Depression screen PHQ 2/9  Decreased Interest 0 0 0 0 0  Down, Depressed, Hopeless 0 0 0 0 0  PHQ - 2 Score 0 0 0 0 0    Review of Systems:   Pertinent items are noted in HPI Denies any urinary or bowel changes.  Denies pelvic exam.  Pertinent History Reviewed:  Reviewed past medical,surgical, social and family history.  Reviewed problem list, medications and allergies. Physical Assessment:   Vitals:   10/10/23 1106  BP: 138/75  Pulse: 72  SpO2: 100%  Weight: 106 lb 6.4 oz (48.3 kg)  Height: 5' 3 (1.6 m)  Body mass index is 18.85 kg/m.        Physical Examination:   General appearance - well appearing, and in no distress  Mental status - alert, oriented to person, place, and time  Psych:  She has a normal mood and affect  Skin - warm and dry, normal color, no suspicious lesions noted  Chest - effort normal, all  lung fields clear to auscultation bilaterally  Heart - normal rate and regular rhythm  Neck:  midline trachea, no thyromegaly or nodules  Breasts - surgically absent with well healed scars, no masses, no LAD  Abdomen - soft, nontender, nondistended, no masses or organomegaly  Pelvic - VULVA: normal appearing vulva with no masses, tenderness or lesions   VAGINA: normal appearing vagina with normal color and discharge, no lesions   CERVIX: normal appearing cervix without discharge or lesions, no CMT  Thin prep pap is with HR HPV cotesting  UTERUS: uterus is felt to be normal size, shape, consistency and nontender   ADNEXA: No adnexal masses or tenderness noted.  Rectal - normal rectal, good sphincter tone, no masses felt.   Extremities:  No swelling or varicosities noted  Chaperone present for exam  No results found for this or any previous visit (from the past 24 hours).  Assessment & Plan:  1. Well woman exam with routine gynecological exam (Primary) - Pap smear updated today - Mammogram not indicated - Colonoscopy 06/15/2019.  Follow up 7 years. - Bone mineral density ordered - lab work done with PCP, Dr. Onita - vaccines reviewed/updated  2. Cervical cancer screening - Cytology - PAP( [facility])  3. Iron  deficiency anemia due to chronic blood loss - as she has not has any bleeding since surgery, do not  feel additional blood work needed at this time  4. Fibroids, submucosal - s/p resection - asked to be updated with next bleeding episode  5. Hypoestrogenism - DG Bone Density; Future   Orders Placed This Encounter  Procedures   DG Bone Density    Meds: No orders of the defined types were placed in this encounter.   Follow-up: Return in about 1 year (around 10/09/2024).  Ronal GORMAN Pinal, MD 10/13/2023 9:31 PM

## 2023-10-15 LAB — CYTOLOGY - PAP
Comment: NEGATIVE
Diagnosis: NEGATIVE
High risk HPV: NEGATIVE

## 2023-10-16 ENCOUNTER — Ambulatory Visit (HOSPITAL_BASED_OUTPATIENT_CLINIC_OR_DEPARTMENT_OTHER): Payer: Self-pay | Admitting: Obstetrics & Gynecology

## 2023-10-31 ENCOUNTER — Other Ambulatory Visit (HOSPITAL_BASED_OUTPATIENT_CLINIC_OR_DEPARTMENT_OTHER): Payer: Self-pay

## 2023-10-31 MED ORDER — EPINEPHRINE 0.3 MG/0.3ML IJ SOAJ
INTRAMUSCULAR | 1 refills | Status: AC
  Filled 2023-10-31: qty 2, 30d supply, fill #0

## 2023-10-31 MED ORDER — PREDNISONE 20 MG PO TABS
20.0000 mg | ORAL_TABLET | Freq: Every day | ORAL | 0 refills | Status: DC
  Filled 2023-10-31: qty 10, 10d supply, fill #0

## 2023-11-04 ENCOUNTER — Other Ambulatory Visit (HOSPITAL_BASED_OUTPATIENT_CLINIC_OR_DEPARTMENT_OTHER): Payer: Self-pay

## 2023-11-04 ENCOUNTER — Encounter: Payer: Self-pay | Admitting: Internal Medicine

## 2023-11-04 ENCOUNTER — Other Ambulatory Visit (HOSPITAL_COMMUNITY): Payer: Self-pay

## 2023-11-04 DIAGNOSIS — D500 Iron deficiency anemia secondary to blood loss (chronic): Secondary | ICD-10-CM

## 2023-11-04 DIAGNOSIS — E785 Hyperlipidemia, unspecified: Secondary | ICD-10-CM

## 2023-11-29 ENCOUNTER — Encounter: Payer: Self-pay | Admitting: Internal Medicine

## 2023-11-30 ENCOUNTER — Ambulatory Visit (HOSPITAL_BASED_OUTPATIENT_CLINIC_OR_DEPARTMENT_OTHER)
Admission: RE | Admit: 2023-11-30 | Discharge: 2023-11-30 | Disposition: A | Discharge status: Home / Self Care | Source: Ambulatory Visit | Attending: Obstetrics & Gynecology | Admitting: Obstetrics & Gynecology

## 2023-11-30 DIAGNOSIS — E2839 Other primary ovarian failure: Secondary | ICD-10-CM | POA: Diagnosis present

## 2023-12-02 LAB — CBC

## 2023-12-03 ENCOUNTER — Ambulatory Visit: Payer: Self-pay | Admitting: Internal Medicine

## 2023-12-03 LAB — CBC
Hematocrit: 38.1 % (ref 34.0–46.6)
Hemoglobin: 12.5 g/dL (ref 11.1–15.9)
MCH: 32.1 pg (ref 26.6–33.0)
MCHC: 32.8 g/dL (ref 31.5–35.7)
MCV: 98 fL — AB (ref 79–97)
Platelets: 113 x10E3/uL — AB (ref 150–450)
RBC: 3.9 x10E6/uL (ref 3.77–5.28)
RDW: 12.2 % (ref 11.7–15.4)
WBC: 4.1 x10E3/uL (ref 3.4–10.8)

## 2023-12-03 LAB — NMR, LIPOPROFILE
Cholesterol, Total: 150 mg/dL (ref 100–199)
HDL Particle Number: 33.6 umol/L (ref >=30.5)
HDL-C: 77 mg/dL (ref >39)
LDL Particle Number: 639 nmol/L (ref <1000)
LDL Size: 20.9 nm (ref >20.5)
LDL-C (NIH Calc): 64 mg/dL (ref 0–99)
LP-IR Score: <25 (ref <=45)
Small LDL Particle Number: 176 nmol/L (ref <=527)
Triglycerides: 36 mg/dL (ref 0–149)

## 2023-12-03 LAB — COMPREHENSIVE METABOLIC PANEL WITH GFR
ALT: 60 IU/L — ABNORMAL HIGH (ref 0–32)
AST: 46 IU/L — ABNORMAL HIGH (ref 0–40)
Albumin: 4.6 g/dL (ref 3.8–4.9)
Alkaline Phosphatase: 80 IU/L (ref 49–135)
BUN/Creatinine Ratio: 22 (ref 9–23)
BUN: 14 mg/dL (ref 6–24)
Bilirubin Total: 0.8 mg/dL (ref 0.0–1.2)
CO2: 23 mmol/L (ref 20–29)
Calcium: 9.1 mg/dL (ref 8.7–10.2)
Chloride: 100 mmol/L (ref 96–106)
Creatinine, Ser: 0.65 mg/dL (ref 0.57–1.00)
Globulin, Total: 2.3 g/dL (ref 1.5–4.5)
Glucose: 75 mg/dL (ref 70–99)
Potassium: 4 mmol/L (ref 3.5–5.2)
Sodium: 137 mmol/L (ref 134–144)
Total Protein: 6.9 g/dL (ref 6.0–8.5)
eGFR: 105 mL/min/1.73 (ref >59)

## 2023-12-03 LAB — CK: Total CK: 89 U/L (ref 32–182)

## 2023-12-06 ENCOUNTER — Ambulatory Visit: Admitting: Thoracic Surgery (Cardiothoracic Vascular Surgery)

## 2023-12-11 ENCOUNTER — Ambulatory Visit: Attending: Student in an Organized Health Care Education/Training Program | Admitting: Internal Medicine

## 2023-12-11 ENCOUNTER — Encounter: Payer: Self-pay | Admitting: Internal Medicine

## 2023-12-11 VITALS — BP 98/62 | HR 82 | Resp 16 | Ht 63.0 in | Wt 108.2 lb

## 2023-12-11 DIAGNOSIS — D72819 Decreased white blood cell count, unspecified: Secondary | ICD-10-CM | POA: Diagnosis not present

## 2023-12-11 DIAGNOSIS — D500 Iron deficiency anemia secondary to blood loss (chronic): Secondary | ICD-10-CM | POA: Diagnosis not present

## 2023-12-11 DIAGNOSIS — I6523 Occlusion and stenosis of bilateral carotid arteries: Secondary | ICD-10-CM

## 2023-12-11 DIAGNOSIS — E785 Hyperlipidemia, unspecified: Secondary | ICD-10-CM

## 2023-12-11 NOTE — Patient Instructions (Signed)
 Medication Instructions:  NO CHANGES  *If you need a refill on your cardiac medications before your next appointment, please call your pharmacy*  Lab Work: FASTING lab work in 6 months -  NMR Lipoprofile CBC CK Hepatic Function Panel If you have labs (blood work) drawn today and your tests are completely normal, you will receive your results only by: MyChart Message (if you have MyChart) OR A paper copy in the mail If you have any lab test that is abnormal or we need to change your treatment, we will call you to review the results.  Testing/Procedures: Carotid Doppler - due 03/2024  Follow-Up: At [facility], you and your health needs are our priority.  As part of our continuing mission to provide you with exceptional heart care, our providers are all part of one team.  This team includes your primary Cardiologist (physician) and Advanced Practice Providers or APPs (Physician Assistants and Nurse Practitioners) who all work together to provide you with the care you need, when you need it.  Your next appointment:    12 months with Dr. Mona  We recommend signing up for the patient portal called MyChart.  Sign up information is provided on this After Visit Summary.  MyChart is used to connect with patients for Virtual Visits (Telemedicine).  Patients are able to view lab/test results, encounter notes, upcoming appointments, etc.  Non-urgent messages can be sent to your provider as well.   To learn more about what you can do with MyChart, go to [email or web address].   Other Instructions

## 2023-12-11 NOTE — Progress Notes (Signed)
 LIPID CLINIC CONSULT NOTE  Chief Complaint:  Follow-up  Primary Care Physician: Onita Rush, MD  Primary Cardiologist:  None  HPI:  Sarah [name] is a 53 y.o. female who is being seen today for dyslipidemia (self-referred). 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  She reports recent work-up for potential TIA which involved a CT angiogram of the head and neck that demonstrated mild atherosclerotic changes at the carotid bifurcations as well as aortic atherosclerosis.  Prior to that in May 2022 she underwent calcium  scoring which showed no coronary calcification.  There was however mild calcification of the descending aorta.  As part of her work-up she saw Dr. Cherrie who ordered a monitor and echo with bubble study.  The bubble study was negative which showed normal LV function.  The monitor showed 3 episodes of SVT.  She says she has them intermittently and sometimes a little more sustained.  She is not on medication for this.  She had a lipid NMR through her primary care provider in July showing total LDL particle number of 1291, LDL-C of 111, HDL-C of 63, triglycerides 110 and a small LDL particle number was low at 259.  LDL size is large at 21.8 Angstrom.  I reviewed those results with her today.  Subsequent to this she was started on rosuvastatin  5 mg twice weekly.  She has not had repeat lipids.  She does report that her mother has some carotid atherosclerosis however she is in her 10s.  She reports recent significant changes in her diet including increasing fish, decreasing meats and more vegetables, less sugar and more exercise.  04/20/2022  [name] returns today for follow-up.  Overall she is doing well on rosuvastatin .  She has had further decrease in her lipids.  Her LDL particle number is now 410 with an LDL-C of 41, triglycerides 127 and HDL 75.  Small LDL particle is very low at 163.  She has no overt side effects  from the medication.  She said recently she had dysfunctional uterine bleeding which is unusual for her but I think it is unlikely to be the statin although she did find some case reports of this.  She also is 53 years old and this may be normal.  She has not had repeat carotid imaging although she was noted to have chronic atherosclerosis on CT angiography.  05/31/2023  Comfort is seen today for follow-up.  She is not feeling all that well.  She says she is very fatigued.  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  We did some recent lab work that she requested.  Her cholesterol is gone up substantially with an LDL particle #2037, LDL 118, HDL 91 and triglycerides 37.  This is in response to stopping her statin for the past 2 months because she felt unwell.  Surprisingly her CK went up somewhat to 190, previously 149.  Her CBC is abnormal with a white count of 2400 and anemia with low red count and hemoglobin and hematocrit of 9.4 and 30.3.  Platelets were 173 but have been previously low in the 90s and 100s.  Repeat iron  studies showed normal TIBC but low iron  level 24, low Tran sat at 6% and normal ferritin.  She apparently is having some heavy periods and her GYN is discussing possible medication suppression of this or ablation versus hysterectomy.  I am concerned however that she has 3 cell lines suppression  including leukopenia and thrombocytopenia.  She says in the past she saw a hematologist once but did not have a bone marrow biopsy.  She is also on hydroxychloroquine , which can lead to bone marrow suppression.  12/11/2023  [name] returns today for follow-up.  She seems to be doing well although had an issue with anemia and some chest discomfort.  She was found to have bleeding related to fibroids which was corrected and she is now feeling better.  She has been on the rosuvastatin  every other day but her cholesterol numbers recently have improved.  LDL is now 64 with a particle number  of 639, HDL 77 and triglycerides 36.  Her small LDL particle numbers 176.  CK was performed and was normal.  Her CBC showed some mild thrombocytopenia at 113,000 platelets.  AST and ALT were mildly elevated at 46 and 60 which is I think likely related to the statin.  Will continue to monitor this.  She is due for repeat carotid Dopplers next year.  She has a follow-up coming up with Dr. Shyrl regarding a small nodule in her lung which so far has been so small it cannot be characterized.  PMHx:  Past Medical History:  Diagnosis Date   ANA positive    ?Sjogrens   Breast neoplasm, Tis (LCIS), left    Cholecystitis    Cholelithiasis    Dry eye    Dysrhythmia    SVT   Fibroid    Gall stones    GERD (gastroesophageal reflux disease)    High cholesterol    Hypothyroidism    Takes synthroid    Infertility, female    Iron  deficiency anemia    Keratitis 2020   Lactose intolerance    Leukopenia    Sjogren's disease    Thrombocytopenia    Urticaria    episodes since childhood    Vitamin D  deficiency disease    Takes Vit D   Weight loss     Past Surgical History:  Procedure Laterality Date   CHOLECYSTECTOMY N/A 07/13/2014   Procedure: LAPAROSCOPIC CHOLECYSTECTOMY WITH INTRAOPERATIVE CHOLANGIOGRAM;  Surgeon: Donnice Bury, MD;  Location: [facility] OR;  Service: General;  Laterality: N/A;   egg retrieval  2008; 2011; 2012   multiple IVF cycles   ENDOMETRIAL BIOPSY     HYSTEROSCOPY WITH MYOMECTOMY N/A 08/01/2023   Procedure: HYSTEROSCOPY WITH MYOMECTOMY;  Surgeon: Cleotilde Ronal RAMAN, MD;  Location: [facility] OR;  Service: Gynecology;  Laterality: N/A;  hysteroscopy with resection of myoma, possible novasure ablation   SIMPLE MASTECTOMY WITH AXILLARY SENTINEL NODE BIOPSY Bilateral 07/15/2019   Procedure: RIGHT RISK REDUCING MASTECTOMY AND LEFT MASTECTOMY  WITH LEFT AXILLARY SENTINEL NODE BIOPSY;  Surgeon: Bury Donnice, MD;  Location: [facility] OR;  Service: General;  Laterality: Bilateral;  PEC BLOCK    WISDOM TOOTH EXTRACTION      FAMHx:  Family History  Problem Relation Age of Onset   Lupus Sister    Thyroid  disease Sister    Thyroid  disease Mother     SOCHx:   reports that she has never smoked. She has never used smokeless tobacco. She reports that she does not drink alcohol  and does not use drugs.  ALLERGIES:  Allergies  Allergen Reactions   Haemophilus Influenzae Vaccines Anaphylaxis   Lactose Intolerance (Gi) Diarrhea    Stomach pain   Lifitegrast Other (See Comments)    corneal lesion   Latex Rash   Systane Balance [Propylene Glycol] Rash   Tape Rash   Wound Dressing  Adhesive Rash    ROS: Pertinent items noted in HPI and remainder of comprehensive ROS otherwise negative.  HOME MEDS: Current Outpatient Medications on File Prior to Visit  Medication Sig Dispense Refill   b complex vitamins capsule Take 1 capsule by mouth every 30 (thirty) days.     carboxymethylcellul-glycerin (OPTIVE) 0.5-0.9 % ophthalmic solution Place 1 drop into both eyes See admin instructions. Four to Six time a day     Cholecalciferol (VITAMIN D -3) 125 MCG (5000 UT) TABS Take by mouth.     clobetasol  (TEMOVATE ) 0.05 % external solution Apply 1 Application topically 2 (two) times daily. 50 mL 3   cycloSPORINE , PF, (CEQUA ) 0.09 % SOLN Place 1 drop into both eyes 2 (two) times daily. 180 each 3   EPINEPHrine  (EPIPEN  2-PAK) 0.3 mg/0.3 mL IJ SOAJ injection Use as directed as need for Injection 2 each 1   hydroxychloroquine  (PLAQUENIL ) 200 MG tablet Take 2 tablets (400mg ) by mouth every other day, alternating with 1 tablet every other day. 135 tablet 3   rosuvastatin  (CRESTOR ) 20 MG tablet Take 1 tablet (20 mg total) by mouth daily. 90 tablet 3   No current facility-administered medications on file prior to visit.    LABS/IMAGING: No results found for this or any previous visit (from the past 48 hours). No results found.  LIPID PANEL:    Component Value Date/Time   CHOL 191 07/17/2018 1333    CHOL 217 (H) 09/12/2016 0941   TRIG 167.0 (H) 07/17/2018 1333   HDL 53.70 07/17/2018 1333   HDL 67 09/12/2016 0941   CHOLHDL 4 07/17/2018 1333   VLDL 33.4 07/17/2018 1333   LDLCALC 104 (H) 07/17/2018 1333   LDLCALC 125 (H) 09/12/2016 0941    WEIGHTS: Wt Readings from Last 3 Encounters:  12/11/23 108 lb 3.2 oz (49.1 kg)  10/10/23 106 lb 6.4 oz (48.3 kg)  08/01/23 107 lb (48.5 kg)    VITALS: BP 98/62 (BP Location: Right Arm, Patient Position: Sitting, Cuff Size: Normal)   Pulse 82   Resp 16   Ht 5' 3 (1.6 m)   Wt 108 lb 3.2 oz (49.1 kg)   SpO2 99%   BMI 19.17 kg/m   EXAM: General appearance: alert and no distress Lungs: clear to auscultation bilaterally Heart: regular rate and rhythm, S1, S2 normal, no murmur, click, rub or gallop Extremities: extremities normal, atraumatic, no cyanosis or edema Neurologic: Grossly normal  EKG: Deferred  ASSESSMENT: Mixed dyslipidemia, goal LDL less than 70 Carotid and aortic atherosclerosis Zero coronary calcium  (05/2020) Iron  deficiency anemia Leukopenia, as well as thrombocytopenia Mildly elevated liver enzymes  PLAN: 1.   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  She had an issue with bleeding fibroids recently which was resolved.  Her CK is normal.  She has some mild elevation in AST and ALT.I think this is related to her statin.  She is currently taking it every other day.  Will plan repeat lipid profile, liver enzymes, CBC and CK per her request in about 6 months.  She will have carotid Dopplers as well again next year.  Plan otherwise follow-up with me annually or sooner as necessary.  Vinie KYM Maxcy, MD, [facility], FNLA, FACP  [place]  [facility]  Medical Director of the [facility]id [facility] &  Cardiovascular Risk Reduction Clinic Diplomate of the American Board of Clinical Lipidology Attending Cardiologist  Direct Dial: [phone]  Fax: [phone]  Website:  [email or web address]   Vinie JAYSON Maxcy  12/11/2023, 9:58 AM

## 2023-12-20 ENCOUNTER — Ambulatory Visit
Attending: Thoracic Surgery (Cardiothoracic Vascular Surgery) | Admitting: Thoracic Surgery (Cardiothoracic Vascular Surgery)

## 2023-12-20 VITALS — BP 113/75 | HR 78 | Resp 18 | Ht 63.0 in | Wt 107.0 lb

## 2023-12-20 DIAGNOSIS — R911 Solitary pulmonary nodule: Secondary | ICD-10-CM

## 2023-12-20 NOTE — Progress Notes (Signed)
      [address]       [place] [address]             [phone]        Sarah [name] [facility] Medical Record #[id number] Date of Birth: May 02, 1970  Referring: Onita Rush, MD Primary Care: Onita Rush, MD Primary Cardiologist:None  Reason for visit:   follow-up  History of Present Illness:     Sarah [name] present for in follow-up to review her CT chest.  She has no new complaints.  She currently works part-time both at [employer] and it comes.  Physical Exam: BP 113/75   Pulse 78   Resp 18   Ht 5' 3 (1.6 m)   Wt 107 lb (48.5 kg)   SpO2 98%   BMI 18.95 kg/m   Alert NAD EWOB Regular rate    Diagnostic Studies & Laboratory data: CT chest LUNGS/AIRWAYS:   Central airways are patent. Stable 8 mm groundglass right lower lobe  pulmonary nodule compared to 11/23/2022 . Adjacent 4 mm right lower lobe  groundglass nodule is stable dating back to 01/01/2020. No new pulmonary  nodules.      Assessment / Plan:   53 y.o. 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  She continue to get her [facility] at [facility] every 6 months.  Based on it location, wedge resection would be challenging.  She remains hesitant to proceed with any intervention at this point.  I have recommended continued surveillance, but if this changes or continues to grow, bronchoscopic marking with a generous wedge resection, and possible lobectomy has been recommended.  Sarah [name] 12/20/2023 12:38 PM

## 2024-01-02 ENCOUNTER — Other Ambulatory Visit (HOSPITAL_COMMUNITY): Payer: Self-pay

## 2024-01-02 ENCOUNTER — Other Ambulatory Visit: Payer: Self-pay | Admitting: Internal Medicine

## 2024-01-03 ENCOUNTER — Other Ambulatory Visit: Payer: Self-pay

## 2024-01-03 ENCOUNTER — Encounter: Payer: Self-pay | Admitting: Internal Medicine

## 2024-01-03 ENCOUNTER — Other Ambulatory Visit (HOSPITAL_COMMUNITY): Payer: Self-pay

## 2024-01-03 MED ORDER — ROSUVASTATIN CALCIUM 20 MG PO TABS
20.0000 mg | ORAL_TABLET | ORAL | 3 refills | Status: AC
  Filled 2024-01-03: qty 45, 90d supply, fill #0
  Filled 2024-02-12: qty 45, 90d supply, fill #1

## 2024-02-12 ENCOUNTER — Other Ambulatory Visit (HOSPITAL_COMMUNITY): Payer: Self-pay

## 2024-04-06 ENCOUNTER — Ambulatory Visit (HOSPITAL_COMMUNITY)

## 2024-10-13 ENCOUNTER — Ambulatory Visit (HOSPITAL_BASED_OUTPATIENT_CLINIC_OR_DEPARTMENT_OTHER): Payer: Self-pay | Admitting: Obstetrics & Gynecology

## 2024-10-20 ENCOUNTER — Ambulatory Visit (HOSPITAL_BASED_OUTPATIENT_CLINIC_OR_DEPARTMENT_OTHER): Admitting: Obstetrics & Gynecology
